# Patient Record
Sex: Male | Born: 1950 | Race: White | Hispanic: No | Marital: Married | Smoking: Never smoker
Health system: Southern US, Community
[De-identification: ages and names within clinical notes are randomized; demographics above are authoritative.]

## PROBLEM LIST (undated history)

## (undated) ENCOUNTER — Encounter

## (undated) ENCOUNTER — Telehealth

## (undated) DIAGNOSIS — R55 Syncope and collapse: Secondary | ICD-10-CM

## (undated) DIAGNOSIS — E785 Hyperlipidemia, unspecified: Secondary | ICD-10-CM

## (undated) DIAGNOSIS — Z973 Presence of spectacles and contact lenses: Secondary | ICD-10-CM

## (undated) DIAGNOSIS — E039 Hypothyroidism, unspecified: Secondary | ICD-10-CM

## (undated) DIAGNOSIS — Z87442 Personal history of urinary calculi: Secondary | ICD-10-CM

## (undated) DIAGNOSIS — R42 Dizziness and giddiness: Secondary | ICD-10-CM

## (undated) DIAGNOSIS — E079 Disorder of thyroid, unspecified: Secondary | ICD-10-CM

## (undated) DIAGNOSIS — I10 Essential (primary) hypertension: Secondary | ICD-10-CM

## (undated) HISTORY — PX: HERNIA REPAIR: SHX51

## (undated) HISTORY — DX: Disorder of thyroid, unspecified: E07.9

## (undated) HISTORY — PX: LITHOTRIPSY: SUR834

## (undated) HISTORY — PX: VASECTOMY: SHX75

## (undated) HISTORY — DX: Essential (primary) hypertension: I10

## (undated) HISTORY — DX: Hyperlipidemia, unspecified: E78.5

## (undated) HISTORY — DX: Syncope and collapse: R55

## (undated) HISTORY — PX: UPPER GI ENDOSCOPY: SHX6162

## (undated) HISTORY — PX: ROTATOR CUFF REPAIR: SHX139

## (undated) HISTORY — PX: COLONOSCOPY: SHX174

## (undated) HISTORY — PX: URETHRAL DILATION: SUR417

---

## 2011-03-04 ENCOUNTER — Other Ambulatory Visit: Payer: Self-pay | Admitting: Family Medicine

## 2011-03-04 DIAGNOSIS — N442 Benign cyst of testis: Secondary | ICD-10-CM

## 2011-03-17 ENCOUNTER — Other Ambulatory Visit: Payer: Self-pay | Admitting: Family Medicine

## 2011-03-17 ENCOUNTER — Ambulatory Visit
Admission: RE | Admit: 2011-03-17 | Discharge: 2011-03-17 | Disposition: A | Discharge status: Home / Self Care | Payer: BC Managed Care – PPO | Source: Ambulatory Visit | Attending: Family Medicine | Admitting: Family Medicine

## 2011-03-17 DIAGNOSIS — N442 Benign cyst of testis: Secondary | ICD-10-CM

## 2012-01-05 ENCOUNTER — Other Ambulatory Visit: Payer: Self-pay | Admitting: Internal Medicine

## 2012-01-05 DIAGNOSIS — N281 Cyst of kidney, acquired: Secondary | ICD-10-CM

## 2012-01-10 ENCOUNTER — Ambulatory Visit
Admission: RE | Admit: 2012-01-10 | Discharge: 2012-01-10 | Disposition: A | Discharge status: Home / Self Care | Payer: BC Managed Care – PPO | Source: Ambulatory Visit | Attending: Internal Medicine | Admitting: Internal Medicine

## 2012-01-10 DIAGNOSIS — N281 Cyst of kidney, acquired: Secondary | ICD-10-CM

## 2015-04-18 LAB — COMPREHENSIVE METABOLIC PANEL
ALT: 19
AST: 23
Alkaline Phosphatase: 95
BUN: 14 (ref 4–21)
CREATININE: 0.95
GLUCOSE: 92
Potassium: 4.5
Sodium: 140
TOTBILIFLUID: 0.5

## 2015-04-18 LAB — LIPID PANEL
Cholesterol, Total: 183
HDL: 63
LDL: 99
TRIGLYCERIDES: 107

## 2015-04-18 LAB — CBC
HEMATOCRIT: 42
Hemoglobin: 13.5
Platelet: 239
WBC: 5.73

## 2015-04-18 LAB — THYROID PANEL
FREE T4: 1.1
TSH: 2.54

## 2015-10-29 DIAGNOSIS — I10 Essential (primary) hypertension: Principal | ICD-10-CM

## 2015-10-29 DIAGNOSIS — E039 Hypothyroidism, unspecified: Secondary | ICD-10-CM

## 2015-10-29 DIAGNOSIS — E785 Hyperlipidemia, unspecified: Secondary | ICD-10-CM

## 2015-10-29 MED ORDER — LEVOTHYROXINE SODIUM 50 MCG PO TABS
3 refills | Status: CP
Start: 2015-10-29 — End: 2015-10-31

## 2015-10-29 MED ORDER — AMLODIPINE BESYLATE 10 MG PO TABS
3 refills | Status: CP
Start: 2015-10-29 — End: 2015-10-31

## 2015-10-29 MED ORDER — ATORVASTATIN CALCIUM 20 MG PO TABS
3 refills | Status: CP
Start: 2015-10-29 — End: 2015-10-31

## 2015-10-29 NOTE — Telephone Encounter
Pt pharmacy called in and stated that the pt med was lost in the mail never made to the pt and needs auth for med selected     Thank you     Pharmacy: [facility] - [place], [place] - [address]?[Patient Preferred]??[phone]

## 2015-10-31 DIAGNOSIS — I10 Essential (primary) hypertension: Principal | ICD-10-CM

## 2015-10-31 DIAGNOSIS — E039 Hypothyroidism, unspecified: Secondary | ICD-10-CM

## 2015-10-31 DIAGNOSIS — E785 Hyperlipidemia, unspecified: Secondary | ICD-10-CM

## 2015-10-31 MED ORDER — LEVOTHYROXINE SODIUM 50 MCG PO TABS
3 refills | Status: CP
Start: 2015-10-31 — End: ?

## 2015-10-31 MED ORDER — AMLODIPINE BESYLATE 10 MG PO TABS
3 refills | Status: CP
Start: 2015-10-31 — End: ?

## 2015-10-31 MED ORDER — ATORVASTATIN CALCIUM 20 MG PO TABS
3 refills | Status: CP
Start: 2015-10-31 — End: ?

## 2016-01-14 ENCOUNTER — Inpatient Hospital Stay: Admit: 2016-01-14 | Discharge: 2016-01-15

## 2016-01-14 ENCOUNTER — Ambulatory Visit: Attending: Family Medicine

## 2016-01-14 DIAGNOSIS — E039 Hypothyroidism, unspecified: Secondary | ICD-10-CM

## 2016-01-14 DIAGNOSIS — E063 Autoimmune thyroiditis: Principal | ICD-10-CM

## 2016-01-14 DIAGNOSIS — R310 Gross hematuria: Secondary | ICD-10-CM

## 2016-01-14 DIAGNOSIS — I10 Essential (primary) hypertension: Secondary | ICD-10-CM

## 2016-01-14 DIAGNOSIS — E612 Magnesium deficiency: Secondary | ICD-10-CM

## 2016-01-14 DIAGNOSIS — E785 Hyperlipidemia, unspecified: Secondary | ICD-10-CM

## 2016-01-14 LAB — T4, FREE: FREE T4: 1.1

## 2016-01-14 LAB — CBC AND DIFFERENTIAL
HCT: 42 (ref 41–53)
HCT: 42 (ref 41–53)
HEMOGLOBIN: 13.5 (ref 13.5–17.5)
Hemoglobin: 13.5 (ref 13.5–17.5)
NEUTROS ABS: 4
Platelets: 239 (ref 150–399)
Platelets: 239 (ref 150–399)
WBC: 5.7
WBC: 5.7

## 2016-01-14 LAB — BASIC METABOLIC PANEL
BUN: 14 (ref 4–21)
BUN: 14 (ref 4–21)
Creatinine: 1 (ref <=1.3)
Creatinine: 1 (ref <=1.3)
GLUCOSE: 92
Glucose: 92
POTASSIUM: 4.5 (ref 3.4–5.3)
POTASSIUM: 4.5 (ref 3.4–5.3)
SODIUM: 140 (ref 137–147)
Sodium: 140 (ref 137–147)

## 2016-01-14 LAB — LIPID PANEL
Cholesterol: 183 (ref 0–200)
Cholesterol: 183 (ref 0–200)
HDL: 63 (ref 35–70)
HDL: 63 (ref 35–70)
LDL CALC: 99
LDL CALC: 99
TRIGLYCERIDES: 107 (ref 40–160)
Triglycerides: 107 (ref 40–160)

## 2016-01-14 LAB — TSH: TSH: 2.54 (ref <=5.90)

## 2016-01-14 LAB — HEPATIC FUNCTION PANEL
ALT: 19 (ref 10–40)
ALT: 19 (ref 10–40)
AST: 23 (ref 14–40)
AST: 23 (ref 14–40)
Alkaline Phosphatase: 95 (ref 25–125)
Alkaline Phosphatase: 95 (ref 25–125)
BILIRUBIN, TOTAL: 0.5
Bilirubin, Total: 0.5

## 2016-01-15 NOTE — Progress Notes
Results reviewed and will be discussed in detail at patients upcoming appointment.

## 2016-02-05 ENCOUNTER — Ambulatory Visit: Attending: Family Medicine

## 2016-02-05 DIAGNOSIS — N189 Chronic kidney disease, unspecified: Secondary | ICD-10-CM

## 2016-02-05 DIAGNOSIS — E063 Autoimmune thyroiditis: Secondary | ICD-10-CM

## 2016-02-05 DIAGNOSIS — Z1211 Encounter for screening for malignant neoplasm of colon: Secondary | ICD-10-CM

## 2016-02-05 DIAGNOSIS — E039 Hypothyroidism, unspecified: Secondary | ICD-10-CM

## 2016-02-05 DIAGNOSIS — Z23 Encounter for immunization: Secondary | ICD-10-CM

## 2016-02-05 DIAGNOSIS — E785 Hyperlipidemia, unspecified: Secondary | ICD-10-CM

## 2016-02-05 DIAGNOSIS — D749 Methemoglobinemia, unspecified: Secondary | ICD-10-CM

## 2016-02-05 DIAGNOSIS — M7712 Lateral epicondylitis, left elbow: Secondary | ICD-10-CM

## 2016-02-05 DIAGNOSIS — E079 Disorder of thyroid, unspecified: Secondary | ICD-10-CM

## 2016-02-05 DIAGNOSIS — N281 Cyst of kidney, acquired: Secondary | ICD-10-CM

## 2016-02-05 DIAGNOSIS — Z125 Encounter for screening for malignant neoplasm of prostate: Secondary | ICD-10-CM

## 2016-02-05 DIAGNOSIS — R918 Other nonspecific abnormal finding of lung field: Secondary | ICD-10-CM

## 2016-02-05 DIAGNOSIS — Z0000 Encounter for general adult medical examination without abnormal findings: Principal | ICD-10-CM

## 2016-02-05 DIAGNOSIS — I10 Essential (primary) hypertension: Principal | ICD-10-CM

## 2016-02-06 NOTE — Progress Notes
Heart: The heart is normal is size.  There is no pericardial effusion seen.  The cardiac   vessels demonstrate minimal or no calcification.  Aorta and Great Vessels:No aneurysm of the aortic arch or thoracic aorta is seen.  The proximal   great vessels demonstrate no significant stenoses.  Lungs: The lung fields are clear bilaterally without infiltrates, volume loss, or mass lesions.     There are no pleural effusions, pneumothorax, or hemothorax seen. Stable right upper lobe   subpleural nodular density, series 4 image 202. Stable right lower lobe subpleural nodular   density, series 4 image 432. Additional stable 3 mm right lower lobe nodular density, series 4   image 357. No new or increasing pulmonary nodules.  ?  Upper Abdomen: Limited images of the visualized upper abdomen demonstrate no acute abnormality.   Again visualized is right renal cystic finding within the superior pole of the right kidney   which is incompletely visualized and demonstrates internal calcified septation.  ?  IMPRESSION:  ?  Stable nonspecific subcentimeter pulmonary nodules. No new or increasing nodules. No further   specific radiologic follow-up recommended.  ?  Right renal superior pole cystic hypodensity. Please see prior renal ultrasounds for further   details.  ?  Read By Beverly Milch- Christopher Klassen M.D.    Electronically Verified By Beverly Milch- Christopher Klassen M.D.    Released Date Time - 09/26/2015 8:39 AM    Resident -   ?  Past Medical History:   Diagnosis Date   ? Chronic kidney disease     kidney stones and cysts   ? Chronic lymphocytic thyroiditis 05/30/2012    Allscripts Description: Hashimoto's Thyroiditis  (Created by Conversion)    ? Hyperlipidemia    ? Hyperlipidemia, unspecified hyperlipidemia type 04/21/2012    Allscripts Description: Hyperlipidemia  (Created by Conversion)    ? Hypertension    ? Hypothyroidism 04/21/2012    Allscripts Description: Hypothyroidism  (Created by Conversion)    ? Lung nodules

## 2016-02-06 NOTE — Progress Notes
?   Methemoglobinemia 2006    per pt, given too much novicaine during dental procedure, was in hosptial 4 days   ? Thyroid disease      Past Surgical History:   Procedure Laterality Date   ? ARTHROSCOPY SHOULDER W/ ROTATOR CUFF REPAIR Right 08/15/2014    ARTHROSCOPY SHOULDER W/ ROTATOR CUFF REPAIR SUBACROMIAL DECOMPRESSION performed by Venancio PoissonBrunelli, Jeffrey Allen[facility]   ? COLONOSCOPY  2009   ? HERNIA REPAIR     ? LITHOTRIPSY      Surgery Description: Lithotripsy;  Problem Comments: twice (Created by Conversion)   ? OTHER SURGICAL HISTORY      Surgery Description: Surgery Of Male Genitalia Vasectomy;   (Created by Conversion)   ? UMBILICAL HERNIA REPAIR      Surgery Description: Umbilical Hernia Repair - Over Age 50;   (Created by Conversion)   ? UPPER GASTROINTESTINAL ENDOSCOPY  2009   ? URETHRAL STRICTURE DILATATION      Surgery Description: Dilation Of Male Urethral Stricture;  Problem Comments: five times (Created by Conversion)     Family History   Problem Relation Age of Onset   ? Cancer Mother      ovarian   ? Heart Disease Father    ? Hypertension Father    ? Cancer Brother      lymph   ? Eczema Sister      Social History     Social History   ? Marital status: Married     Spouse name: N/A   ? Number of children: N/A   ? Years of education: N/A     Occupational History   ? Not on file.     Social History Main Topics   ? Smoking status: Never Smoker   ? Smokeless tobacco: Never Used   ? Alcohol use 12.0 oz/week     1 Cans of beer per week   ? Drug use: No   ? Sexual activity: Not on file     Other Topics Concern   ? Not on file     Social History Narrative     Current Outpatient Prescriptions on File Prior to Visit   Medication Sig   ? amLODIPine (NORVASC) 10 MG Tablet TAKE 1 TABLET DAILY AS DIRECTED   ? atorvastatin (LIPITOR) 20 MG Tablet TAKE 1 TABLET DAILY AT BEDTIME   ? levothyroxine (SYNTHROID, LEVOTHROID) 50 MCG Tablet TAKE 1 TABLET DAILY

## 2016-02-06 NOTE — Progress Notes
education was provided in the AVS.  See orders for any further follow up plans.  Colonoscopy ordered stressed importance  Check PSA in Feb           2. Abnormal ct chest  Stable   No further workup needed  Call if any issues    2A.  Hypothyroidism  Euthyroid on current dose  Recheck in Spring 2018     2B  Golfers elbow  Recommend strap  Call if not improving  Consider imaging and ortho consult  He asks to wait     2C. Suture granuloma   stable  Consider surg consult if worsening  Precautions given       3.  Right renal cyst with nephrolitihiasis  Recheck us in Dec  Hydrate well   Aim for 2l of urine daily  Call if any changes     4. Hyperlipidemia  Doing well   Continue lipitor  Check labs in 4 months     No orders of the following type(s) were placed in this encounter: Medications.     Orders Placed This Encounter   Procedures   ? COLONOSCOPY W/ OR W/O BIOPSY   ? US Renal Bladder Complete   ? Comprehensive Metabolic Panel - Therapy Plan   ? TSH   ? PSA (Screening)   ? Hemoglobin A1c w/EAG   ? Urinalysis W/Microscopy   ? Lipid Panel       Health Maintenance was reviewed. The patient's HM Topic list was:                                            Health Maintenance   Topic Date Due   ? Preventive Wellness Visit  01/13/1970   ? DTaP,Tdap,and Td Vaccines (1 - Tdap) 01/14/1971   ? Pneumovax / Prevnar (1 of 1 - PPSV23) 01/14/1971   ? Colon Cancer Screening  01/13/2002   ? Zoster Vaccine (1) 01/14/2012   ? Influenza Vaccine (1) 12/05/2015   ? Lipid Profile  01/13/2017   ? Basic Metabolic Panel  01/13/2017   ? USPSTF HIV Risk Assessment  Addressed   ? USPSTF Hepatitis C Screening  Completed     No changes made.

## 2016-02-06 NOTE — Progress Notes
Initials Name Effective Dates    MD Kennis CarinaDonovan, Mary A, [place] 07/08/15 -         Physical Exam    GENERAL: Vitals reviewed and patient is in NAD  Pulse 99% on RA BMI 29  HEENT: Neck supple.     LUNGS: Clear to auscultation bilaterally, no wheezes with forced expiration, respirations unlabored.  HEART: Regular rate and rhythm,  no ectopic beats or murmur  ABDOMEN: Non-tender, no organomegaly, no palpable masses or lesions.   No cva ttp b/l   MUSC  Left elbow  Full rom  Mild ttp over medial epicondyle  Light touch intact distally   EXTREMITIES: No edema.    SKIN: No rashes, no evidence of skin breakdown,    NEURO: Gait normal.   MENTAL STATUS: Alert, normal MS. Answers all questions appropriately.      Assessment:       ICD-10-CM ICD-9-CM    1. Physical exam Z00.00 V70.9    2. Need for prophylactic vaccination and inoculation against influenza Z23 V04.81    3. Screen for colon cancer Z12.11 V76.51 COLONOSCOPY W/ OR W/O BIOPSY   4. Renal cyst, right N28.1 753.10 US Renal Bladder Complete   5. Lateral epicondylitis of left elbow M77.12 726.32    6. Screening for prostate cancer Z12.5 V76.44 PSA (Screening)   7. Hyperlipidemia, unspecified hyperlipidemia type E78.5 272.4 CBC and Differential - Therapy Plan      Comprehensive Metabolic Panel - Therapy Plan      TSH      PSA (Screening)      Hemoglobin A1c w/EAG      Urinalysis W/Microscopy      Lipid Panel   8. Hypothyroidism, unspecified type E03.9 244.9 CBC and Differential - Therapy Plan      Comprehensive Metabolic Panel - Therapy Plan      TSH      PSA (Screening)      Hemoglobin A1c w/EAG      Urinalysis W/Microscopy      Lipid Panel          Plan:   1. Physical exam  Up 2lbs  BMI 29   Mabel's Estimated body mass index is 29.7 kg/(m^2) as calculated from the following:    Height as of this encounter: 1.778 m (5\' 10" ).    Weight as of this encounter: 93.9 kg (207 lb).      The health risks associated with an elevated BMI were discussed and

## 2016-02-06 NOTE — Progress Notes
ALBUMIN/GLOBULIN RATIO Latest Units: (calc) 1.5  1.5   AST Latest Ref Range: 14 - 33 IU/L 19  23   ALT Latest Ref Range: 10 - 42 IU/L 19  19   EGFR Latest Units: mL/min/1.73M2 >59  >59   Total Bilirubin Latest Ref Range: 0.2 - 1.0 mg/dL 0.5  0.5   Alkaline Phosphatase Latest Ref Range: 40 - 129 IU/L 95  95   Osmolality Calc Unknown 281.9  279.5   MAGNESIUM Latest Ref Range: 1.8 - 2.6 mg/dL   2.3   Cholesterol, Total Latest Ref Range: 100 - 199 mg/dL 171  183   HDL Latest Ref Range: >=60 mg/dL 60  63   LDL Calculated Latest Units: mg/dL 91  99   NON-HDL CHOLESTEROL Latest Units: mg/dL (calc) 111  120   Triglycerides Latest Ref Range: <=150 mg/dL 102  107   est AVG Glucose Latest Units: mg/dL 102.54     Free T4 Latest Ref Range: 0.80 - 1.70 ng/dL   1.10   HGB A1C % Latest Ref Range: 4.8 - 5.9 % 5.2     TSH, 3RD GENERATION Latest Ref Range: 0.270 - 4.200 mIU/L 3.430  2.540   WBC Latest Ref Range: 4.5 - 11 x10E3/uL 5.68  5.73   RBC Latest Ref Range: 4.50 - 6.30 x10E6/uL 4.92  4.82   HEMOGLOBIN Latest Ref Range: 14.0 - 18.0 g/dL 14.0  13.5 (L)   HEMATOCRIT Latest Ref Range: 40.0 - 54.0 % 44.5  42.1   MCV Latest Ref Range: 82.0 - 101.0 fl 90.4  87.3   MCH Latest Ref Range: 27.0 - 34.0 pg 28.5  28.0   MCHC Latest Ref Range: 31.0 - 36.0 g/dL 31.5  32.1   RDW Latest Ref Range: 12.0 - 16.1 % 13.2  13.0   PLATELET COUNT Latest Ref Range: 140 - 440 thou/cu mm 221  239   MPV Latest Ref Range: 9.5 - 11.5 fl 11.0  10.8   Neutrophils Latest Ref Range: 34.0 - 73.0 % 61.4  62.2   IMMATURE GRANULOCYTES Latest Ref Range: 0.0 - 2.0 % 0.4  0.2   Lymphocytes % Latest Ref Range: 25.0 - 45.0 % 24.8 (L)  26.2   MONOCYTES Latest Ref Range: 2.0 - 6.0 % 8.3 (H)  7.2 (H)   EOSINOPHILS Latest Ref Range: 1.0 - 4.0 % 4.2 (H)  3.5   nRBC % Latest Ref Range: 0.0 - 1.0 % 0.0  0.0   Basophils % Latest Ref Range: 0 - 1 % 0.9  0.7   Neutrophils Absolute Latest Ref Range: 1.80 - 8.70 x10E3/uL 3.49  3.57

## 2016-02-06 NOTE — Progress Notes
?   magnesium oxide (MAG-OX) 400 (241.3 MG) MG tablet Take 400 mg by mouth daily.     No current facility-administered medications on file prior to visit.      Allergies   Allergen Reactions   ? Oxycodone-Acetaminophen      Extreme leg pain         Review of Systems  Review of Systems  CONSTITUTIONAL: Appetite good, no fevers, mild fatigue no  weight loss, no headache  CV: No chest pain, palpitations, PND or peripheral edema  RESPIRATORY: No cough, shortness of breath, wheezing or dyspnea  GI: No nausea/vomiting, change in bowel habits, blood in stool, dysphagia, heartburn or abdominal pain  GU: No dysuria, urgency or incontinence   EXT: Pain in left elbow  NEURO: No dizziness, numbness, MS changes, motor weakness, or syncope      Objective:        VITAL SIGNS (all recorded)      Clinic Vitals       02/05/16 0905             Amb Encounter Vitals    Weight 93.9 kg (207 lb)    -MD at 02/05/16 0907       Height 1.778 m (5\' 10" )    -MD at 02/05/16 0907       BMI (Calculated) 29.76    -MD at 02/05/16 0907       BSA (Calculated - sq m) 2.15    -MD at 02/05/16 0907       BP 117/74    -MD at 02/05/16 0907       BP Location Left upper arm    -MD at 02/05/16 0907       Position Sitting    -MD at 02/05/16 0907       Pulse 61    -MD at 02/05/16 0907       Resp 16    -MD at 02/05/16 0907       Temp 36.5 ?C (97.7 ?F)    -MD at 02/05/16 [id number]       Temperature Source Oral    -MD at 02/05/16 0907       O2 Saturation 99 %    -MD at 02/05/16 0907       FiO2 Source RA    -MD at 02/05/16 0907       Pain Score 1    -MD at 02/05/16 [id number]       Location kidney    -MD at 02/05/16 0907       Education/Communication Barriers?    Learning/Communication Barriers? No    -MD at 02/05/16 0907       Fall Risk Assessment    Had recent fall / Last 6 months? No recent fall    -MD at 02/05/16 0907       Does patient have a fear of falling? No    -MD at 02/05/16 0907         User Key  (r) = Recorded By, (t) = Taken By, (c) = Cosigned By

## 2016-02-06 NOTE — Progress Notes
Subjective:   Robert BodoDavid Schurman is a 65 y.o. male being seen today for PHYSICAL EXAM       HPI  Pnt is a 65 year old male here for a f/u a Physical    He is doing well overall    Has occasional left elbow pain.  It comes with lifting things. He denies any truama. Better with rest.  Is worse with motion.  Pain is sharp    He is here for a lab review.  He has very  Borderline anemia. He denies blood or black in his stools.     He would like a flu shot    He has a large right renal cyst.  He has seen Dr Mayford KnifeWilliams and has been recommended to follow it serially.   He is due for repeat us in January. He has no current complaints has occasionally pain but overall is doing good.     [place] and he does not have the report. He is open to a new one.          Results for Robert BodoBAKER, Ryosuke (MRN [id number]) as of 02/05/2016 09:16   Ref. Range 12/28/2013 08:15 06/10/2014 08:00 04/18/2015 07:50   PSA Screening Latest Ref Range: 0.0 - 4.0 ng/mL  0.91    PSA, TOTAL Latest Ref Range: 0.00 - 4.00 ng/ml 0.7  0.78       Results for Robert BodoBAKER, Rishon (MRN [id number]) as of 02/05/2016 09:16   Ref. Range 08/12/2015 07:50 09/25/2015 15:53 01/14/2016 09:10   SODIUM Latest Ref Range: 135 - 145 mmol/L 142  140   POTASSIUM, SERUM Latest Ref Range: 3.3 - 4.6 mmol/L 4.1  4.5   CHLORIDE, SERUM Latest Ref Range: 101 - 110 mmol/L 101  102   CARBON DIOXIDE TOTAL Latest Ref Range: 21 - 29 mmol/L 28  27   Urea Nitrogen Latest Ref Range: 6 - 22 mg/dL 11  14   CREATININE Latest Ref Range: 0.67 - 1.17 mg/dL 4.130.95  2.440.95   GLUCOSE, SERUM Latest Ref Range: 71 - 99 mg/dL 87  92   CALCIUM, SERUM Latest Ref Range: 8.6 - 10.0 mg/dL 8.9  9.1   Calc Total Globuin Latest Units: gm/dL 2.8  3.1   ANION GAP Latest Ref Range: 4 - 16 mmol/L 13  11   BUN/Creatinine Ratio Latest Ref Range: 6.0 - 22.0 (calc) 11.6  14.7   TOTAL PROTEIN Latest Ref Range: 6.5 - 8.3 g/dL 7.1  7.6   ALBUMIN Latest Ref Range: 3.8 - 4.9 g/dL 4.3  4.5

## 2016-02-06 NOTE — Progress Notes
Lymphocytes Absolute Latest Units: x10E3/uL 1.41  1.50   Monocytes Absolute Latest Units: x10E3/uL 0.47  0.41   Eosinophils Absolute Latest Units: x10E3/uL 0.24  0.20   Basophils Absolute Latest Units: x10E3/uL 0.05  0.04   Absolute NRBC Count Unknown 0.00  0.00   CBC AND DIFFERENTIAL Unknown Rpt (A)  Rpt (A)   Color -Ur Latest Ref Range: Amber  Yellow  Straw   Clarity, UA Latest Ref Range: Hazy  Cloudy  Clear   PH, URINE Latest Ref Range: 4.5 - 8.0  7.0  7.0   Specific Gravity, Urine Latest Ref Range: 1.003 - 1.030  1.014  1.006   Bilirubin -Ur Latest Ref Range: Negative  Negative  Negative   Blood -Ur Latest Ref Range: Negative  Negative  Negative   Glucose -Ur Latest Ref Range: Negative mg/dL Negative  Negative   Nitrite -Ur Latest Ref Range: Negative  Negative  Negative   Protein-UA Latest Ref Range: Negative mg/dL Negative  Negative   WBC -Ur Latest Ref Range: 0 - 5 HPF 1  0   Leukocytes -Ur Latest Ref Range: Negative  Negative  Negative   Ketones UA Latest Ref Range: Negative mg/dL Negative  Negative   RBC -Ur Latest Ref Range: 0 - 5 HPF <1  0   Squam Epithel, UA Latest Ref Range: Not established /HPF   <1   Bacteria -Ur Latest Ref Range: None seen /HPF None seen  None seen   Urobilinogen -Ur Latest Ref Range: Normal  Normal  Normal   ASCORBIC ACID Latest Ref Range: 20 mg/dL Negative  Negative           Procedure:  CT CHEST W/O CON  Ordering History:   Pulmonary nodules  Additional History: None  ?  Comparison: CT chest from 06/09/2014  ?  Technique: Axial images of the chest from the lung apices through the superior abdomen without   the administration of intravenous contrast are reviewed. High resolution multiplanar   reconstructions are also reviewed.  ?  FINDINGS:  ?  Thyroid: No significant abnormalities.  Mediastinum/hilum: The hila are normal in appearance bilaterally.  No mediastinal masses or   adenopathy is seen.  The visualized tracheobronchial tree is normal

## 2016-03-22 ENCOUNTER — Inpatient Hospital Stay: Admit: 2016-03-22 | Discharge: 2016-03-23

## 2016-03-22 DIAGNOSIS — N200 Calculus of kidney: Secondary | ICD-10-CM

## 2016-03-22 DIAGNOSIS — N281 Cyst of kidney, acquired: Principal | ICD-10-CM

## 2016-06-01 DIAGNOSIS — Z1211 Encounter for screening for malignant neoplasm of colon: Principal | ICD-10-CM

## 2016-06-01 MED ORDER — PEG 3350-KCL-NABCB-NACL-NASULF 236 G PO SOLR
4000 mL | Freq: Once | ORAL | 0 refills | Status: CP
Start: 2016-06-01 — End: ?

## 2016-06-03 DIAGNOSIS — E785 Hyperlipidemia, unspecified: Secondary | ICD-10-CM

## 2016-06-03 DIAGNOSIS — Z1211 Encounter for screening for malignant neoplasm of colon: Principal | ICD-10-CM

## 2016-06-03 DIAGNOSIS — I10 Essential (primary) hypertension: Secondary | ICD-10-CM

## 2016-06-03 DIAGNOSIS — Z79899 Other long term (current) drug therapy: Secondary | ICD-10-CM

## 2016-06-03 DIAGNOSIS — K621 Rectal polyp: Secondary | ICD-10-CM

## 2016-06-03 DIAGNOSIS — K648 Other hemorrhoids: Secondary | ICD-10-CM

## 2016-06-03 DIAGNOSIS — K219 Gastro-esophageal reflux disease without esophagitis: Secondary | ICD-10-CM

## 2016-06-03 DIAGNOSIS — D122 Benign neoplasm of ascending colon: Secondary | ICD-10-CM

## 2016-06-04 ENCOUNTER — Encounter: Attending: Family Medicine

## 2016-06-08 ENCOUNTER — Ambulatory Visit: Attending: Family Medicine

## 2016-06-08 DIAGNOSIS — E063 Autoimmune thyroiditis: Secondary | ICD-10-CM

## 2016-06-08 DIAGNOSIS — E785 Hyperlipidemia, unspecified: Secondary | ICD-10-CM

## 2016-06-08 DIAGNOSIS — D749 Methemoglobinemia, unspecified: Secondary | ICD-10-CM

## 2016-06-08 DIAGNOSIS — Z6829 Body mass index (BMI) 29.0-29.9, adult: Secondary | ICD-10-CM

## 2016-06-08 DIAGNOSIS — E079 Disorder of thyroid, unspecified: Secondary | ICD-10-CM

## 2016-06-08 DIAGNOSIS — N281 Cyst of kidney, acquired: Secondary | ICD-10-CM

## 2016-06-08 DIAGNOSIS — R918 Other nonspecific abnormal finding of lung field: Secondary | ICD-10-CM

## 2016-06-08 DIAGNOSIS — E039 Hypothyroidism, unspecified: Secondary | ICD-10-CM

## 2016-06-08 DIAGNOSIS — R0989 Other specified symptoms and signs involving the circulatory and respiratory systems: Secondary | ICD-10-CM

## 2016-06-08 DIAGNOSIS — R1310 Dysphagia, unspecified: Principal | ICD-10-CM

## 2016-06-08 DIAGNOSIS — H8111 Benign paroxysmal vertigo, right ear: Principal | ICD-10-CM

## 2016-06-08 DIAGNOSIS — N189 Chronic kidney disease, unspecified: Secondary | ICD-10-CM

## 2016-06-08 DIAGNOSIS — I10 Essential (primary) hypertension: Principal | ICD-10-CM

## 2016-06-08 DIAGNOSIS — N442 Benign cyst of testis: Secondary | ICD-10-CM

## 2016-06-08 MED ORDER — MECLIZINE HCL 12.5 MG PO TABS
12.5 mg | Freq: Three times a day (TID) | ORAL | 1 refills | Status: CP | PRN
Start: 2016-06-08 — End: ?

## 2016-06-08 NOTE — Patient Instructions
?   After doing the Epley maneuver, you can return to your normal activities.  ? Ask your health care provider if there is anything you should do at home to prevent vertigo. He or she may recommend that you:  ? Keep your head raised (elevated) with two or more pillows while you sleep.  ? Do not sleep on the side of your affected ear.  ? Get up slowly from bed.  ? Avoid sudden movements during the day.  ? Avoid extreme head movement, like looking up or bending over.  Contact a health care provider if:  ? Your vertigo gets worse.  ? You have other symptoms, including:  ? Nausea.  ? Vomiting.  ? Headache.  Get help right away if:  ? You have vision changes.  ? You have a severe or worsening headache or neck pain.  ? You cannot stop vomiting.  ? You have new numbness or weakness in any part of your body.  Summary  ? Vertigo is the feeling that you or your surroundings are moving when they are not.  ? The Epley maneuver is an exercise that relieves symptoms of vertigo.  ? If the Epley maneuver is done correctly, it is considered safe. You can do it up to 3 times a day.  This information is not intended to replace advice given to you by your health care provider. Make sure you discuss any questions you have with your health care provider.  Document Released: 03/27/2013 Document Revised: 02/10/2016 Document Reviewed: 02/10/2016  Elsevier Interactive Patient Education ? 2017 [employer]

## 2016-06-08 NOTE — Patient Instructions
How to Perform the Epley Maneuver  The Epley maneuver is an exercise that relieves symptoms of vertigo. Vertigo is the feeling that you or your surroundings are moving when they are not. When you feel vertigo, you may feel like the room is spinning and have trouble walking. Dizziness is a little different than vertigo. When you are dizzy, you may feel unsteady or light-headed.  You can do this maneuver at home whenever you have symptoms of vertigo. You can do it up to 3 times a day until your symptoms go away.  Even though the Epley maneuver may relieve your vertigo for a few weeks, it is possible that your symptoms will return. This maneuver relieves vertigo, but it does not relieve dizziness.  What are the risks?  If it is done correctly, the Epley maneuver is considered safe. Sometimes it can lead to dizziness or nausea that goes away after a short time. If you develop other symptoms, such as changes in vision, weakness, or numbness, stop doing the maneuver and call your health care provider.  How to perform the Epley maneuver  1. Sit on the edge of a bed or table with your back straight and your legs extended or hanging over the edge of the bed or table.  2. Turn your head halfway toward the affected ear or side.  3. Lie backward quickly with your head turned until you are lying flat on your back. You may want to position a pillow under your shoulders.  4. Hold this position for 30 seconds. You may experience an attack of vertigo. This is normal.  5. Turn your head to the opposite direction until your unaffected ear is facing the floor.  6. Hold this position for 30 seconds. You may experience an attack of vertigo. This is normal. Hold this position until the vertigo stops.  7. Turn your whole body to the same side as your head. Hold for another 30 seconds.  8. Sit back up.  You can repeat this exercise up to 3 times a day.  Follow these instructions at home:

## 2016-06-09 NOTE — Progress Notes
Subjective:   Robert Fischer is a 66 y.o. male being seen today for Follow up: Radiology/Imaging Result (f/u U/S) and Dizziness (vertigo to nausea)       HPI  Pnt is a 66 year old male here for a f/u      He is here for a f/u for an us.  He has a stable renal cysts and and kidney stone on his right side. He has seen Dr Mayford KnifeWilliams and has been recommended to follow it serially    He has new vertigo.  He states if he turns his head quickly in bed this happens. No dizziness just sitting here.  He states it comes with rolling over or turning his head quickly. No dizziness with standing up quickly. Seems to be when he lies down quickly. No headache.  No falls.      He has a colonoscopy pending next week           Study Result    ? Procedure:  US RENAL BLADDER COMPLETE  Ordering History:  Renal cyst, right  Additional History: None  ?  Technique: Real-time grayscale and color Doppler imaging of the retroperitoneum was performed.  ?  Comparison: Ultrasound of the kidneys from 04/28/2015, CT of the abdomen and pelvis without and   with contrast from 03/15/2013.  ?  Findings:   Right kidney: 11.7 cm in length, normal in size.  Lobulated cystic lesion with thick internal   septation at the superior pole of the right kidney has not significantly changed given   differences in technique and measures 4.5 x 4.6 x 6.4 cm.  There is nonobstructing   nephrolithiasis of the midpole measuring up to 0.5 cm  There is no hydronephrosis.   Left kidney: 12 cm in length, normal in size.  There is normal parenchymal echogenicity.  There   is a stable cyst at the inferior pole of the left kidney measuring 2 x 1.7 x 2.2 cm.  There is   no hydronephrosis.   Urinary Bladder:  Only partially distended precluding evaluation.  ?  Proximal aorta: 2.3 cm   Mid aorta: 1.4 cm   Distal aorta: 0.4 cm   Right common iliac artery: Not seen due to bowel gas.  Left common iliac artery: Not seen due to bowel gas.  Proximal IVC: Negative.  ?  IMPRESSION:

## 2016-06-09 NOTE — Progress Notes
Impression:  Stable renal cysts and no hydronephrosis.  ?  Nonobstructing right nephrolithiasis.  ?  Read By - Elby Showers    Electronically Verified By - Elby Showers    Released Date Time - 03/22/2016 4:08 PM    Resident -   ?     ?  Study Result       Results for Robert Fischer, Robert Fischer (MRN [id number]) as of 06/08/2016 14:48   Ref. Range 01/14/2016 09:10   SODIUM Latest Ref Range: 135 - 145 mmol/L 140   POTASSIUM, SERUM Latest Ref Range: 3.3 - 4.6 mmol/L 4.5   CHLORIDE, SERUM Latest Ref Range: 101 - 110 mmol/L 102   CARBON DIOXIDE TOTAL Latest Ref Range: 21 - 29 mmol/L 27   Urea Nitrogen Latest Ref Range: 6 - 22 mg/dL 14   CREATININE Latest Ref Range: 0.67 - 1.17 mg/dL 0.95   GLUCOSE, SERUM Latest Ref Range: 71 - 99 mg/dL 92   CALCIUM, SERUM Latest Ref Range: 8.6 - 10.0 mg/dL 9.1   Calc Total Globuin Latest Units: gm/dL 3.1   ANION GAP Latest Ref Range: 4 - 16 mmol/L 11   BUN/Creatinine Ratio Latest Ref Range: 6.0 - 22.0 (calc) 14.7   TOTAL PROTEIN Latest Ref Range: 6.5 - 8.3 g/dL 7.6   ALBUMIN Latest Ref Range: 3.8 - 4.9 g/dL 4.5   ALBUMIN/GLOBULIN RATIO Latest Units: (calc) 1.5   AST Latest Ref Range: 14 - 33 IU/L 23   ALT Latest Ref Range: 10 - 42 IU/L 19   EGFR Latest Units: mL/min/1.73M2 >59   Total Bilirubin Latest Ref Range: 0.2 - 1.0 mg/dL 0.5   Alkaline Phosphatase Latest Ref Range: 40 - 129 IU/L 95   Osmolality Calc Unknown 279.5   MAGNESIUM Latest Ref Range: 1.8 - 2.6 mg/dL 2.3   Cholesterol, Total Latest Ref Range: 100 - 199 mg/dL 183   HDL Latest Ref Range: >=60 mg/dL 63   LDL Calculated Latest Units: mg/dL 99   NON-HDL CHOLESTEROL Latest Units: mg/dL (calc) 120   Triglycerides Latest Ref Range: <=150 mg/dL 107   Free T4 Latest Ref Range: 0.80 - 1.70 ng/dL 1.10   TSH, 3RD GENERATION Latest Ref Range: 0.270 - 4.200 mIU/L 2.540   WBC Latest Ref Range: 4.5 - 11 x10E3/uL 5.73   RBC Latest Ref Range: 4.50 - 6.30 x10E6/uL 4.82   HEMOGLOBIN Latest Ref Range: 14.0 - 18.0 g/dL 13.5 (L)

## 2016-06-09 NOTE — Progress Notes
HEMATOCRIT Latest Ref Range: 40.0 - 54.0 % 42.1   MCV Latest Ref Range: 82.0 - 101.0 fl 87.3   MCH Latest Ref Range: 27.0 - 34.0 pg 28.0   MCHC Latest Ref Range: 31.0 - 36.0 g/dL 91.432.1   RDW Latest Ref Range: 12.0 - 16.1 % 13.0   PLATELET COUNT Latest Ref Range: 140 - 440 thou/cu mm 239   MPV Latest Ref Range: 9.5 - 11.5 fl 10.8   Neutrophils Latest Ref Range: 34.0 - 73.0 % 62.2   IMMATURE GRANULOCYTES Latest Ref Range: 0.0 - 2.0 % 0.2   Lymphocytes % Latest Ref Range: 25.0 - 45.0 % 26.2   MONOCYTES Latest Ref Range: 2.0 - 6.0 % 7.2 (H)   EOSINOPHILS Latest Ref Range: 1.0 - 4.0 % 3.5   nRBC % Latest Ref Range: 0.0 - 1.0 % 0.0   Basophils % Latest Ref Range: 0 - 1 % 0.7   Neutrophils Absolute Latest Ref Range: 1.80 - 8.70 x10E3/uL 3.57   Lymphocytes Absolute Latest Units: x10E3/uL 1.50   Monocytes Absolute Latest Units: x10E3/uL 0.41   Eosinophils Absolute Latest Units: x10E3/uL 0.20   Basophils Absolute Latest Units: x10E3/uL 0.04   Absolute NRBC Count Unknown 0.00   CBC AND DIFFERENTIAL Unknown Rpt (A)   Color -Ur Latest Ref Range: Dispensing opticianAmber  Straw   Clarity, UA Latest Ref Range: Hazy  Clear   PH, URINE Latest Ref Range: 4.5 - 8.0  7.0   Specific Gravity, Urine Latest Ref Range: 1.003 - 1.030  1.006   Bilirubin -Ur Latest Ref Range: Negative  Negative   Blood -Ur Latest Ref Range: Negative  Negative   Glucose -Ur Latest Ref Range: Negative mg/dL Negative   Nitrite -Ur Latest Ref Range: Negative  Negative   Protein-UA Latest Ref Range: Negative mg/dL Negative   WBC -Ur Latest Ref Range: 0 - 5 HPF 0   Leukocytes -Ur Latest Ref Range: Negative  Negative   Ketones UA Latest Ref Range: Negative mg/dL Negative   RBC -Ur Latest Ref Range: 0 - 5 HPF 0   Squam Epithel, UA Latest Ref Range: Not established /HPF <1   Bacteria -Ur Latest Ref Range: None seen /HPF None seen   Urobilinogen -Ur Latest Ref Range: Normal  Normal   ASCORBIC ACID Latest Ref Range: 20 mg/dL Negative     Past Medical History:   Diagnosis Date

## 2016-06-09 NOTE — Progress Notes
Other Topics Concern   ? Not on file     Social History Narrative     Current Outpatient Prescriptions on File Prior to Visit   Medication Sig   ? amLODIPine (NORVASC) 10 MG Tablet TAKE 1 TABLET DAILY AS DIRECTED   ? atorvastatin (LIPITOR) 20 MG Tablet TAKE 1 TABLET DAILY AT BEDTIME   ? levothyroxine (SYNTHROID, LEVOTHROID) 50 MCG Tablet TAKE 1 TABLET DAILY   ? magnesium oxide (MAG-OX) 400 (241.3 MG) MG tablet Take 400 mg by mouth daily.   ? PEG-3350 and electrolytes (GoLYTELY) 236 g PO Solution Reconstituted Take 4,000 mLs by mouth once for 1 dose.     No current facility-administered medications on file prior to visit.      Allergies   Allergen Reactions   ? Oxycodone-Acetaminophen      Extreme leg pain         Review of Systems  Review of Systems  CONSTITUTIONAL: Appetite good, no fevers, no  fatigue no  weight loss, no headache  CV: No chest pain, palpitations, PND or peripheral edema  RESPIRATORY: No cough, shortness of breath, wheezing or dyspnea  GI: No nausea/vomiting, change in bowel habits, blood in stool, dysphagia, heartburn or abdominal pain  GU: No dysuria, incontinence   NEURO: + intermittent  dizziness, numbness, MS changes, motor weakness, or syncope      Objective:        VITAL SIGNS (all recorded)      Clinic Vitals       06/08/16 1419             Amb Encounter Vitals    Weight 93.4 kg (206 lb)    -MD at 06/08/16 1422       Height 1.778 m (5\' 10" )    -MD at 06/08/16 1422       BMI (Calculated) 29.62    -MD at 06/08/16 1422       BSA (Calculated - sq m) 2.15    -MD at 06/08/16 1422       BP 113/67    -MD at 06/08/16 1422       BP Location Left upper arm    -MD at 06/08/16 1422       Position Sitting    -MD at 06/08/16 1422       Pulse 71    -MD at 06/08/16 1422       Resp 16    -MD at 06/08/16 1422       Temp 36.2 ?C (97.1 ?F)    -MD at 06/08/16 1422       Temperature Source Oral    -MD at 06/08/16 1422       O2 Saturation 97 %    -MD at 06/08/16 1422       FiO2 Source RA

## 2016-06-09 NOTE — Progress Notes
Neuro exam wnl  Patient given precautions that illnesses may evolve and that they should seek immediate medical treatment if they have:  Worsening fever   Shortness of breath   Vomiting and inability to keep down liquids   Severe pain or worsening pain   Mental status changes  No improvement in 3-5 days   Worsening of symptoms   Any adverse affects they feel is due to any of their current medications    Do not drive or work if any concerns           2. Abnormal ct chest  Stable   No further workup needed  Call if any issues    3.  Hypothyroidism  Euthyroid on current dose  Check labs and address            4.  Right renal cyst with nephrolitihiasis  Recheck us in  1 year  [place]   Hydrate well   Aim for 2l of urine daily  No current complaints   Call if any changes     5. Hyperlipidemia  Doing well   Continue lipitor  Check labs in 4 months     6. Epidydimal cysts?  Check us of testicles   Orders Placed This Encounter   Medications   ? meclizine (ANTIVERT) 12.5 MG PO Tablet     Sig: Take 1 tablet by mouth 3 times daily as needed.     Dispense:  60 tablet     Refill:  1     Orders Placed This Encounter   Procedures   ? US Carotid Complete   ? Refer to Physical Therapy       Health Maintenance was reviewed. The patient's HM Topic list was:                                            Health Maintenance   Topic Date Due   ? Preventive Wellness Visit  01/13/1970   ? DTaP,Tdap,and Td Vaccines (1 - Tdap) 01/14/1971   ? Pneumovax / Prevnar (1 of 1 - PPSV23) 01/14/1971   ? Colon Cancer Screening  01/13/2002   ? Zoster Vaccine (1) 01/14/2012   ? Lipid Profile  01/13/2017   ? Basic Metabolic Panel  01/13/2017   ? USPSTF HIV Risk Assessment  Addressed   ? USPSTF Hepatitis C Screening  Completed   ? Influenza Vaccine  Completed     No changes made.

## 2016-06-09 NOTE — Progress Notes
-  MD at 06/08/16 1422       Pain Score Zero    -MD at 06/08/16 1422       Education/Communication Barriers?    Learning/Communication Barriers? No    -MD at 06/08/16 1422       Fall Risk Assessment    Had recent fall / Last 6 months? No recent fall    -MD at 06/08/16 1422       Does patient have a fear of falling? No    -MD at 06/08/16 1422         User Key  (r) = Recorded By, (t) = Taken By, (c) = Cosigned By    Initials Name Effective Dates    MD Kennis Carinaonovan, Mary A, MA 07/08/15 -         Physical Exam    GENERAL: Vitals reviewed and patient is in NAD  Pulse 99% on RA BMI 29  HEENT: Neck supple.     LUNGS: Clear to auscultation bilaterally, no wheezes with forced expiration, respirations unlabored.  HEART: Regular rate and rhythm,  no ectopic beats or murmur  Mild carotid bruit on left side   ABDOMEN: Non-tender, no organomegaly, no palpable masses or lesions.   EXTREMITIES: No edema.    SKIN: No rashes, no evidence of skin breakdown,    NEURO:   The patient is AAOX3 and in NAD    Cranial nerves II-XII no focal deficits  Motor 5/5 b/l UE and LE    Light touch grossly intact b/l UE and LE    DTR 2/4 b/l bicep, tricep, patella and Achilles    cerebellum: negative finger to nose b/l    gait wnl    + DIX Hallpike to right side   GU  Has small cyst like lesions above right testicle  Left testicle no masses  Chaperone deferred wife in room  MENTAL STATUS: Alert, normal MS. Answers all questions appropriately.      Assessment:       ICD-10-CM ICD-9-CM    1. BPPV (benign paroxysmal positional vertigo), right H81.11 386.11 Refer to Physical Therapy      meclizine (ANTIVERT) 12.5 MG PO Tablet   2. BMI 29.0-29.9,adult Z68.29 V85.25    3. Renal cyst N28.1 753.10    4. Hyperlipidemia, unspecified hyperlipidemia type E78.5 272.4    5. Bruit of left carotid artery R09.89 785.9 US Carotid Complete          Plan:   1. Dizziness  Refer to PT  Trial of meclizine prn  No current issues  Call if worsening  Check us carotid

## 2016-06-09 NOTE — Progress Notes
?   Chronic kidney disease     kidney stones and cysts   ? Chronic lymphocytic thyroiditis 05/30/2012    Allscripts Description: Hashimoto's Thyroiditis  (Created by Conversion)    ? Hyperlipidemia    ? Hyperlipidemia, unspecified hyperlipidemia type 04/21/2012    Allscripts Description: Hyperlipidemia  (Created by Conversion)    ? Hypertension    ? Hypothyroidism 04/21/2012    Allscripts Description: Hypothyroidism  (Created by Conversion)    ? Lung nodules    ? Methemoglobinemia 2006    per pt, given too much novicaine during dental procedure, was in hosptial 4 days   ? Thyroid disease      Past Surgical History:   Procedure Laterality Date   ? ARTHROSCOPY SHOULDER W/ ROTATOR CUFF REPAIR Right 08/15/2014    ARTHROSCOPY SHOULDER W/ ROTATOR CUFF REPAIR SUBACROMIAL DECOMPRESSION performed by Venancio PoissonBrunelli, [facility] Allen[facility]   ? COLONOSCOPY  2009   ? HERNIA REPAIR     ? LITHOTRIPSY      Surgery Description: Lithotripsy;  Problem Comments: twice (Created by Conversion)   ? OTHER SURGICAL HISTORY      Surgery Description: Surgery Of Male Genitalia Vasectomy;   (Created by Conversion)   ? UMBILICAL HERNIA REPAIR      Surgery Description: Umbilical Hernia Repair - Over Age 62;   (Created by Conversion)   ? UPPER GASTROINTESTINAL ENDOSCOPY  2009   ? URETHRAL STRICTURE DILATATION      Surgery Description: Dilation Of Male Urethral Stricture;  Problem Comments: five times (Created by Conversion)     Family History   Problem Relation Age of Onset   ? Cancer Mother      ovarian   ? Heart Disease Father    ? Hypertension Father    ? Cancer Brother      lymph   ? Eczema Sister      Social History     Social History   ? Marital status: Married     Spouse name: N/A   ? Number of children: N/A   ? Years of education: N/A     Occupational History   ? Not on file.     Social History Main Topics   ? Smoking status: Never Smoker   ? Smokeless tobacco: Never Used   ? Alcohol use No   ? Drug use: No   ? Sexual activity: Not on file

## 2016-06-10 ENCOUNTER — Ambulatory Visit

## 2016-06-10 DIAGNOSIS — E039 Hypothyroidism, unspecified: Secondary | ICD-10-CM

## 2016-06-10 DIAGNOSIS — M1990 Unspecified osteoarthritis, unspecified site: Secondary | ICD-10-CM

## 2016-06-10 DIAGNOSIS — I10 Essential (primary) hypertension: Secondary | ICD-10-CM

## 2016-06-10 DIAGNOSIS — E785 Hyperlipidemia, unspecified: Secondary | ICD-10-CM

## 2016-06-10 DIAGNOSIS — K5790 Diverticulosis of intestine, part unspecified, without perforation or abscess without bleeding: Secondary | ICD-10-CM

## 2016-06-10 DIAGNOSIS — K219 Gastro-esophageal reflux disease without esophagitis: Secondary | ICD-10-CM

## 2016-06-10 DIAGNOSIS — N281 Cyst of kidney, acquired: Secondary | ICD-10-CM

## 2016-06-10 DIAGNOSIS — E063 Autoimmune thyroiditis: Secondary | ICD-10-CM

## 2016-06-10 DIAGNOSIS — R911 Solitary pulmonary nodule: Secondary | ICD-10-CM

## 2016-06-10 DIAGNOSIS — N200 Calculus of kidney: Secondary | ICD-10-CM

## 2016-06-10 DIAGNOSIS — Z79899 Other long term (current) drug therapy: Secondary | ICD-10-CM

## 2016-06-10 DIAGNOSIS — Z1211 Encounter for screening for malignant neoplasm of colon: Principal | ICD-10-CM

## 2016-06-10 DIAGNOSIS — K621 Rectal polyp: Secondary | ICD-10-CM

## 2016-06-10 DIAGNOSIS — D122 Benign neoplasm of ascending colon: Secondary | ICD-10-CM

## 2016-06-10 DIAGNOSIS — K648 Other hemorrhoids: Secondary | ICD-10-CM

## 2016-06-10 DIAGNOSIS — D749 Methemoglobinemia, unspecified: Secondary | ICD-10-CM

## 2016-06-11 DIAGNOSIS — E785 Hyperlipidemia, unspecified: Secondary | ICD-10-CM

## 2016-06-11 DIAGNOSIS — E063 Autoimmune thyroiditis: Secondary | ICD-10-CM

## 2016-06-11 DIAGNOSIS — I10 Essential (primary) hypertension: Principal | ICD-10-CM

## 2016-06-11 DIAGNOSIS — R911 Solitary pulmonary nodule: Secondary | ICD-10-CM

## 2016-06-11 DIAGNOSIS — N281 Cyst of kidney, acquired: Secondary | ICD-10-CM

## 2016-06-11 DIAGNOSIS — E039 Hypothyroidism, unspecified: Secondary | ICD-10-CM

## 2016-06-11 DIAGNOSIS — K5790 Diverticulosis of intestine, part unspecified, without perforation or abscess without bleeding: Secondary | ICD-10-CM

## 2016-06-11 DIAGNOSIS — D749 Methemoglobinemia, unspecified: Secondary | ICD-10-CM

## 2016-06-11 DIAGNOSIS — K219 Gastro-esophageal reflux disease without esophagitis: Secondary | ICD-10-CM

## 2016-06-11 DIAGNOSIS — N200 Calculus of kidney: Secondary | ICD-10-CM

## 2016-06-11 DIAGNOSIS — M1990 Unspecified osteoarthritis, unspecified site: Secondary | ICD-10-CM

## 2016-06-11 MED ORDER — CHOLECALCIFEROL 25 MCG (1000 UT) PO TABS
1000 [IU] | Freq: Every day | ORAL
Start: 2016-06-11 — End: ?

## 2016-06-14 ENCOUNTER — Ambulatory Visit

## 2016-06-14 ENCOUNTER — Inpatient Hospital Stay

## 2016-06-14 DIAGNOSIS — N281 Cyst of kidney, acquired: Secondary | ICD-10-CM

## 2016-06-14 DIAGNOSIS — E785 Hyperlipidemia, unspecified: Secondary | ICD-10-CM

## 2016-06-14 DIAGNOSIS — K219 Gastro-esophageal reflux disease without esophagitis: Secondary | ICD-10-CM

## 2016-06-14 DIAGNOSIS — E063 Autoimmune thyroiditis: Secondary | ICD-10-CM

## 2016-06-14 DIAGNOSIS — R911 Solitary pulmonary nodule: Secondary | ICD-10-CM

## 2016-06-14 DIAGNOSIS — Z1211 Encounter for screening for malignant neoplasm of colon: Principal | ICD-10-CM

## 2016-06-14 DIAGNOSIS — N200 Calculus of kidney: Secondary | ICD-10-CM

## 2016-06-14 DIAGNOSIS — Z79899 Other long term (current) drug therapy: Secondary | ICD-10-CM

## 2016-06-14 DIAGNOSIS — K621 Rectal polyp: Secondary | ICD-10-CM

## 2016-06-14 DIAGNOSIS — I10 Essential (primary) hypertension: Principal | ICD-10-CM

## 2016-06-14 DIAGNOSIS — K648 Other hemorrhoids: Secondary | ICD-10-CM

## 2016-06-14 DIAGNOSIS — M1990 Unspecified osteoarthritis, unspecified site: Secondary | ICD-10-CM

## 2016-06-14 DIAGNOSIS — D749 Methemoglobinemia, unspecified: Secondary | ICD-10-CM

## 2016-06-14 DIAGNOSIS — E039 Hypothyroidism, unspecified: Secondary | ICD-10-CM

## 2016-06-14 DIAGNOSIS — D122 Benign neoplasm of ascending colon: Secondary | ICD-10-CM

## 2016-06-14 DIAGNOSIS — K5790 Diverticulosis of intestine, part unspecified, without perforation or abscess without bleeding: Secondary | ICD-10-CM

## 2016-06-14 LAB — HM COLONOSCOPY

## 2016-06-14 MED ORDER — FENTANYL CITRATE INJ 50 MCG/ML CUSTOM AMP/VIAL
50 ug | INTRAVENOUS | Status: DC | PRN
Start: 2016-06-14 — End: 2016-06-14

## 2016-06-14 MED ORDER — DIPHENHYDRAMINE HCL 50 MG/ML IJ SOLN
12.5 mg | Freq: Once | INTRAVENOUS | Status: DC | PRN
Start: 2016-06-14 — End: 2016-06-14

## 2016-06-14 MED ORDER — LACTATED RINGERS IV SOLN
Freq: Once | INTRAVENOUS | Status: DC
Start: 2016-06-14 — End: 2016-06-14

## 2016-06-14 MED ORDER — LACTATED RINGERS IV SOLN
Status: DC | PRN
Start: 2016-06-14 — End: 2016-06-14

## 2016-06-14 MED ORDER — PROMETHAZINE HCL 25 MG/ML IJ SOLN
6.25 mg | INTRAVENOUS | Status: DC | PRN
Start: 2016-06-14 — End: 2016-06-14

## 2016-06-14 MED ORDER — PROPOFOL 10 MG/ML IV EMUL CUSTOM SH
Status: DC | PRN
Start: 2016-06-14 — End: 2016-06-14

## 2016-06-14 MED ORDER — LIDOCAINE HCL (PF) 1 % IJ SOLN SH
INTRAVENOUS | Status: DC | PRN
Start: 2016-06-14 — End: 2016-06-14

## 2016-06-14 MED ORDER — ONDANSETRON HCL 4 MG/2ML IJ SOLN
4 mg | Freq: Once | INTRAVENOUS | Status: DC | PRN
Start: 2016-06-14 — End: 2016-06-14

## 2016-06-14 MED ORDER — NALOXONE HCL 2 MG/2ML IJ SOSY
.4 mg | INTRAVENOUS | Status: DC | PRN
Start: 2016-06-14 — End: 2016-06-14

## 2016-06-14 NOTE — Anesthesia Preprocedure Evaluation
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

## 2016-06-14 NOTE — Anesthesia Preprocedure Evaluation
Department of Anesthesiology  Pre-operative Evaluation Record    Patient Name: Robert Fischer                  Sex: male                  Age: 66 y.o.                  MRN: [id number]     Allergies:   Oxycodone-acetaminophen    Current Med List   Medication Sig   ? cholecalciferol (VITAMIN D-3) 1000 units PO Tablet Take 1,000 Units by mouth daily.       Prior Surgeries:     Past Surgical History:   Procedure Laterality Date   ? ARTHROSCOPY SHOULDER W/ ROTATOR CUFF REPAIR Right 08/15/2014    ARTHROSCOPY SHOULDER W/ ROTATOR CUFF REPAIR SUBACROMIAL DECOMPRESSION performed by Venancio PoissonBrunelli, Jeffrey Allen[facility]   ? COLONOSCOPY  2009   ? CYSTOURETHROSCOPY     ? LITHOTRIPSY      x2   ? LITHOTRIPSY     ? UMBILICAL HERNIA REPAIR      Surgery Description: Umbilical Hernia Repair - Over Age 83;   (Created by Conversion)   ? UPPER GASTROINTESTINAL ENDOSCOPY  2009   ? URETHRAL STRICTURE DILATATION      Multiple   ? VASECTOMY         Social   ETOH:       Alcohol Use   ? 12.0 oz/week   ? 1 Cans of beer per week     Tobacco:   History   Smoking Status   ? Never Smoker   Smokeless Tobacco   ? Never Used     Drugs:   History   Drug Use No       Evaluation:   ROS / Med Hx:  Negative for review of systems and medical history except otherwise noted below:    Pulmonary: Pulmonary Nodules stable per CT   Cardiovascular: Negative cardio ROS except otherwise noted. Cardiac history includes: hyperlipidemia and hypertension. The hypertension is well controlled. The hypertension has occurred for 6 - 10 years.   GI/Hepatic: Negative for GI/hepatic ROS except otherwise noted. acid reflux. GERD controlled with medication. The GERD occurs infrequently. GI pathology: diverticulosis.   Renal: Renal Cysts; Kidney Stones   Endo/Other: Negative for endo/other ROS except otherwise noted The patient has a history of osteoarthritis. The patient has a history of thyroid problem. The type is hypothyroidism.

## 2016-06-14 NOTE — H&P
?   PEG-3350 and electrolytes (GoLYTELY) 236 g PO Solution Reconstituted Take 4,000 mLs by mouth once for 1 dose.       Current Hospital Medications:  Scheduled:   Continuous Infusions:   ? LR       PRN:   diphenhydrAMINE 12.5 mg Intravenous Once PRN   fentaNYL 50 mcg Intravenous Q5 Min PRN   nalOXone 0.4 mg Intravenous PRN   ondansetron 4 mg Intravenous Once PRN   promethazine 6.25 mg Intravenous Q15 Min PRN        Allergies:  is allergic to oxycodone-acetaminophen.    Review of Systems  Respiratory: Negative  Cardiovascular: Negative  Gastrointestinal: Negative    Airway: Open, uncompromised    NPO since: pm      Physical Exam  Lungs: clear to auscultation bilaterally  Heart: regular rate and rhythm, S1, S2 normal, no murmur, click, rub or gallop  Abdomen: soft, non-tender; bowel sounds normal; no masses, no organomegaly    Assessment / Diagnosis: ok    ASA:  Class I ? normal healthy patient    Previous Anesthesia: MAC    Problems/Reaction: no    Any Contraindications for Sedation?: no    Anesthetic/Sedation Plan: General/MAC Anesthesia requested    Options and risks discussed with Patient    Sedation consent signed and in Medical Record: yes    In light of the above evaluation, I believe this patient is an acceptable candidate for the procedure planned as outlined above.    James S Scolapio  06/14/2016 9:54 AM

## 2016-06-14 NOTE — Progress Notes
Patient arrived to PACU from OR. Patient placed on monitor. Report received from Marnie , RN and CRNA  from anesthesiology. Patient arrived in Stable condition.

## 2016-06-14 NOTE — Anesthesia Preprocedure Evaluation
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   Pulmonary:  On pulmonary examination breath sounds clear to auscultation.   Cardiovascular: On cardiovascular examination regular rate and rhythm.     Anesthesia Plan:  Patient has an ASA score of 2 and patient is NPO. Plan for anesthesia technique is general.  With an intravenous type of induction. Anesthetic plan and risks has been discussed with the patient.     I have performed a pre-anesthesia examination, evaluation, and prescribed the anesthesia plan.

## 2016-06-14 NOTE — H&P
Department of Medicine  Division of Gastroenterology, Hepatology & Nutrition    Procedure Planned: Colonoscopy    Reason for Procedure: Screening for colon cancer-average risk      History: Past Medical History:   Diagnosis Date   ? Chronic lymphocytic thyroiditis 05/30/2012    Allscripts Description: Hashimoto's Thyroiditis  (Created by Conversion)    ? Diverticulosis    ? GERD (gastroesophageal reflux disease)    ? Hyperlipidemia    ? Hypertension    ? Hypothyroidism 04/21/2012    Allscripts Description: Hypothyroidism  (Created by Conversion)    ? Methemoglobinemia 2006    per pt, given too much novocaine during dental procedure, was in hosptial 4 days   ? Nephrolithiasis    ? Osteoarthritis    ? Pulmonary nodule     Stable per CT   ? Renal cyst      Past Surgical History:   Procedure Laterality Date   ? ARTHROSCOPY SHOULDER W/ ROTATOR CUFF REPAIR Right 08/15/2014    ARTHROSCOPY SHOULDER W/ ROTATOR CUFF REPAIR SUBACROMIAL DECOMPRESSION performed by [facility]runelli, [facility]   ? COLONOSCOPY  2009   ? CYSTOURETHROSCOPY     ? LITHOTRIPSY      x2   ? LITHOTRIPSY     ? UMBILICAL HERNIA REPAIR      Surgery Description: Umbilical Hernia Repair - Over Age 24;   (Created by Conversion)   ? UPPER GASTROINTESTINAL ENDOSCOPY  2009   ? URETHRAL STRICTURE DILATATION      Multiple   ? VASECTOMY          I have reviewed the past medical and surgical, history.    Home Medications:  Prescriptions Prior to Admission   Medication Sig   ? amLODIPine (NORVASC) 10 MG Tablet TAKE 1 TABLET DAILY AS DIRECTED   ? atorvastatin (LIPITOR) 20 MG Tablet TAKE 1 TABLET DAILY AT BEDTIME   ? cholecalciferol (VITAMIN D-3) 1000 units PO Tablet Take 1,000 Units by mouth daily.   ? levothyroxine (SYNTHROID, LEVOTHROID) 50 MCG Tablet TAKE 1 TABLET DAILY   ? magnesium oxide (MAG-OX) 400 (241.3 MG) MG tablet Take 400 mg by mouth daily as needed.    ? meclizine (ANTIVERT) 12.5 MG PO Tablet Take 1 tablet by mouth 3 times daily as needed.

## 2016-06-14 NOTE — Progress Notes
Patient is complaining of abdominal pain, abd is distended. Patient encouraged to

## 2016-06-14 NOTE — Progress Notes
Patient is complaining of abdominal pain, abd is distended. Patient encouraged to

## 2016-06-14 NOTE — Progress Notes
Patient discharge instructions and education discussed and verbalized understanding, by patient and Wife , patient driver. Patient discharge instructions given to Wife . Patient left with all personal belongings. Patient ambulated to wheelchair with steady gait. Patient escorted to vehicle via wheelchair. Patient condition at discharge: Stable.

## 2016-06-14 NOTE — H&P
?   PEG-3350 and electrolytes (GoLYTELY) 236 g PO Solution Reconstituted Take 4,000 mLs by mouth once for 1 dose.       Current Hospital Medications:  Scheduled:   Continuous Infusions:   ? LR       PRN:   diphenhydrAMINE 12.5 mg Intravenous Once PRN   fentaNYL 50 mcg Intravenous Q5 Min PRN   nalOXone 0.4 mg Intravenous PRN   ondansetron 4 mg Intravenous Once PRN   promethazine 6.25 mg Intravenous Q15 Min PRN        Allergies:  is allergic to oxycodone-acetaminophen.    Review of Systems  Respiratory: Negative  Cardiovascular: Negative  Gastrointestinal: Negative    Airway: Open, uncompromised    NPO since: pm      Physical Exam  Lungs: clear to auscultation bilaterally  Heart: regular rate and rhythm, S1, S2 normal, no murmur, click, rub or gallop  Abdomen: soft, non-tender; bowel sounds normal; no masses, no organomegaly    Assessment / Diagnosis: ok    ASA:  Class I ? normal healthy patient    Previous Anesthesia: MAC    Problems/Reaction: no    Any Contraindications for Sedation?: no    Anesthetic/Sedation Plan: General/MAC Anesthesia requested    Options and risks discussed with Patient    Sedation consent signed and in [place]    In light of the above evaluation, I believe this patient is an acceptable candidate for the procedure planned as outlined above.    Drue Stager Scolapio  06/14/2016 9:54 AM

## 2016-06-14 NOTE — Progress Notes
Patient is complaining of abdominal pain, abd is distended. Patient encouraged to walk and sit on toilet. Patient started passing gas with relief. Dr. Scolapio made aware assessed patient and feels patient is good to be discharged.

## 2016-06-14 NOTE — H&P
Department of Medicine  Division of Gastroenterology, Hepatology & Nutrition    Procedure Planned: Colonoscopy    Reason for Procedure: Screening for colon cancer-average risk      History: Past Medical History:   Diagnosis Date   ? Chronic lymphocytic thyroiditis 05/30/2012    Allscripts Description: Hashimoto's Thyroiditis  (Created by Conversion)    ? Diverticulosis    ? GERD (gastroesophageal reflux disease)    ? Hyperlipidemia    ? Hypertension    ? Hypothyroidism 04/21/2012    Allscripts Description: Hypothyroidism  (Created by Conversion)    ? Methemoglobinemia 2006    per pt, given too much novocaine during dental procedure, was in hosptial 4 days   ? Nephrolithiasis    ? Osteoarthritis    ? Pulmonary nodule     Stable per CT   ? Renal cyst      Past Surgical History:   Procedure Laterality Date   ? ARTHROSCOPY SHOULDER W/ ROTATOR CUFF REPAIR Right 08/15/2014    ARTHROSCOPY SHOULDER W/ ROTATOR CUFF REPAIR SUBACROMIAL DECOMPRESSION performed by Brunelli, Jeffrey Allen, MD at [facility]   ? COLONOSCOPY  2009   ? CYSTOURETHROSCOPY     ? LITHOTRIPSY      x2   ? LITHOTRIPSY     ? UMBILICAL HERNIA REPAIR      Surgery Description: Umbilical Hernia Repair - Over Age 6;   (Created by Conversion)   ? UPPER GASTROINTESTINAL ENDOSCOPY  2009   ? URETHRAL STRICTURE DILATATION      Multiple   ? VASECTOMY          I have reviewed the past medical and surgical, history.    Home Medications:  Prescriptions Prior to Admission   Medication Sig   ? amLODIPine (NORVASC) 10 MG Tablet TAKE 1 TABLET DAILY AS DIRECTED   ? atorvastatin (LIPITOR) 20 MG Tablet TAKE 1 TABLET DAILY AT BEDTIME   ? cholecalciferol (VITAMIN D-3) 1000 units PO Tablet Take 1,000 Units by mouth daily.   ? levothyroxine (SYNTHROID, LEVOTHROID) 50 MCG Tablet TAKE 1 TABLET DAILY   ? magnesium oxide (MAG-OX) 400 (241.3 MG) MG tablet Take 400 mg by mouth daily as needed.    ? meclizine (ANTIVERT) 12.5 MG PO Tablet Take 1 tablet by mouth 3 times daily as needed.

## 2016-06-14 NOTE — Progress Notes
Patient is complaining of abdominal pain, abd is distended. Patient encouraged to walk and sit on toilet. Patient started passing gas with relief. Dr. Cala BradfordScolapio made aware assessed patient and feels patient is good to be discharged.

## 2016-06-14 NOTE — Anesthesia Postprocedure Evaluation
Post Anesthesia Eval    Respiratory function appropriate( normal respiratory rate and oxygen saturation, airway patent )  Cardiovascular function appropriate( normal heart rate and blood pressure )  Mental status appropriate  Patient normothermic  Pain well controlled  No uncontrolled nausea and vomiting  Patient appropriately hydrated  No apparent anesthesia complications  Patient tolerated procedure well

## 2016-06-15 DIAGNOSIS — E785 Hyperlipidemia, unspecified: Secondary | ICD-10-CM

## 2016-06-15 DIAGNOSIS — E063 Autoimmune thyroiditis: Secondary | ICD-10-CM

## 2016-06-15 DIAGNOSIS — E039 Hypothyroidism, unspecified: Secondary | ICD-10-CM

## 2016-06-15 DIAGNOSIS — K5790 Diverticulosis of intestine, part unspecified, without perforation or abscess without bleeding: Secondary | ICD-10-CM

## 2016-06-15 DIAGNOSIS — N281 Cyst of kidney, acquired: Secondary | ICD-10-CM

## 2016-06-15 DIAGNOSIS — R911 Solitary pulmonary nodule: Secondary | ICD-10-CM

## 2016-06-15 DIAGNOSIS — D749 Methemoglobinemia, unspecified: Secondary | ICD-10-CM

## 2016-06-15 DIAGNOSIS — N200 Calculus of kidney: Secondary | ICD-10-CM

## 2016-06-15 DIAGNOSIS — I10 Essential (primary) hypertension: Principal | ICD-10-CM

## 2016-06-15 DIAGNOSIS — K219 Gastro-esophageal reflux disease without esophagitis: Secondary | ICD-10-CM

## 2016-06-15 DIAGNOSIS — M1990 Unspecified osteoarthritis, unspecified site: Secondary | ICD-10-CM

## 2016-06-17 ENCOUNTER — Inpatient Hospital Stay: Admit: 2016-06-17 | Discharge: 2016-06-18

## 2016-06-17 DIAGNOSIS — R0989 Other specified symptoms and signs involving the circulatory and respiratory systems: Secondary | ICD-10-CM

## 2016-06-17 DIAGNOSIS — N442 Benign cyst of testis: Secondary | ICD-10-CM

## 2016-06-17 DIAGNOSIS — N433 Hydrocele, unspecified: Principal | ICD-10-CM

## 2016-07-01 ENCOUNTER — Inpatient Hospital Stay: Admit: 2016-07-01 | Discharge: 2016-07-03

## 2016-07-01 NOTE — Progress Notes
Date of Service:  07/01/2016    Name,DOB,MRN: Robert Fischer, 10-10-1950, [id number]    Visit Information:  ? Visit Number:   ? Left Without Being Seen Number:   ? Canceled Number:   ? No Show Number: 1  ? Comment: Pt did not show for scheduled initial physical therapy evaluation    * Was an interpreter used: NA    ________________________  Ignacia Felling, PT

## 2016-08-02 ENCOUNTER — Encounter: Attending: Family Medicine

## 2016-08-31 ENCOUNTER — Ambulatory Visit (INDEPENDENT_AMBULATORY_CARE_PROVIDER_SITE_OTHER): Payer: 59 | Admitting: Adult Health

## 2016-08-31 ENCOUNTER — Encounter: Payer: Self-pay | Admitting: Adult Health

## 2016-08-31 VITALS — BP 146/78 | HR 64 | Ht 69.0 in | Wt 202.7 lb

## 2016-08-31 DIAGNOSIS — Z0000 Encounter for general adult medical examination without abnormal findings: Secondary | ICD-10-CM

## 2016-08-31 DIAGNOSIS — K469 Unspecified abdominal hernia without obstruction or gangrene: Secondary | ICD-10-CM | POA: Diagnosis not present

## 2016-08-31 DIAGNOSIS — I10 Essential (primary) hypertension: Secondary | ICD-10-CM

## 2016-08-31 DIAGNOSIS — E785 Hyperlipidemia, unspecified: Secondary | ICD-10-CM

## 2016-08-31 DIAGNOSIS — N200 Calculus of kidney: Secondary | ICD-10-CM

## 2016-08-31 NOTE — Assessment & Plan Note (Addendum)
Continue all medications as directed. Continue excellent water intake and heart healthy diet. We have requested medical records from previous PCP in [place]. Follow-up in 3 months, or sooner if needed.

## 2016-08-31 NOTE — Patient Instructions (Addendum)
Heart-Healthy Eating Plan Many factors influence your heart health, including eating and exercise habits. Heart (coronary) risk increases with abnormal blood fat (lipid) levels. Heart-healthy meal planning includes limiting unhealthy fats, increasing healthy fats, and making other small dietary changes. This includes maintaining a healthy body weight to help keep lipid levels within a normal range. What is my plan? Your health care provider recommends that you:  Get no more than _________% of the total calories in your daily diet from fat.  Limit your intake of saturated fat to less than _________% of your total calories each day.  Limit the amount of cholesterol in your diet to less than _________ mg per day. What types of fat should I choose?  Choose healthy fats more often. Choose monounsaturated and polyunsaturated fats, such as olive oil and canola oil, flaxseeds, walnuts, almonds, and seeds.  Eat more omega-3 fats. Good choices include salmon, mackerel, sardines, tuna, flaxseed oil, and ground flaxseeds. Aim to eat fish at least two times each week.  Limit saturated fats. Saturated fats are primarily found in animal products, such as meats, butter, and cream. Plant sources of saturated fats include palm oil, palm kernel oil, and coconut oil.  Avoid foods with partially hydrogenated oils in them. These contain trans fats. Examples of foods that contain trans fats are stick margarine, some tub margarines, cookies, crackers, and other baked goods. What general guidelines do I need to follow?  Check food labels carefully to identify foods with trans fats or high amounts of saturated fat.  Fill one half of your plate with vegetables and green salads. Eat 4-5 servings of vegetables per day. A serving of vegetables equals 1 cup of raw leafy vegetables,  cup of raw or cooked cut-up vegetables, or  cup of vegetable juice.  Fill one fourth of your plate with whole grains. Look for the word  "whole" as the first word in the ingredient list.  Fill one fourth of your plate with lean protein foods.  Eat 4-5 servings of fruit per day. A serving of fruit equals one medium whole fruit,  cup of dried fruit,  cup of fresh, frozen, or canned fruit, or  cup of 100% fruit juice.  Eat more foods that contain soluble fiber. Examples of foods that contain this type of fiber are apples, broccoli, carrots, beans, peas, and barley. Aim to get 20-30 g of fiber per day.  Eat more home-cooked food and less restaurant, buffet, and fast food.  Limit or avoid alcohol.  Limit foods that are high in starch and sugar.  Avoid fried foods.  Cook foods by using methods other than frying. Baking, boiling, grilling, and broiling are all great options. Other fat-reducing suggestions include:  Removing the skin from poultry.  Removing all visible fats from meats.  Skimming the fat off of stews, soups, and gravies before serving them.  Steaming vegetables in water or broth.  Lose weight if you are overweight. Losing just 5-10% of your initial body weight can help your overall health and prevent diseases such as diabetes and heart disease.  Increase your consumption of nuts, legumes, and seeds to 4-5 servings per week. One serving of dried beans or legumes equals  cup after being cooked, one serving of nuts equals 1 ounces, and one serving of seeds equals  ounce or 1 tablespoon.  You may need to monitor your salt (sodium) intake, especially if you have high blood pressure. Talk with your health care provider or dietitian to get more  information about reducing sodium. What foods can I eat? Grains   Breads, including [place], white, pita, wheat, raisin, rye, oatmeal, and [place]. Tortillas that are neither fried nor made with lard or trans fat. Low-fat rolls, including hotdog and hamburger buns and English muffins. Biscuits. Muffins. Waffles. Pancakes. Light popcorn. Whole-grain cereals. Flatbread.  Melba toast. Pretzels. Breadsticks. Rusks. Low-fat snacks and crackers, including oyster, saltine, matzo, graham, animal, and rye. Rice and pasta, including [place] rice and those that are made with whole wheat. Vegetables  All vegetables. Fruits  All fruits, but limit coconut. Meats and Other Protein Sources  Lean, well-trimmed beef, veal, pork, and lamb. Chicken and [place] without skin. All fish and shellfish. Wild duck, rabbit, pheasant, and venison. Egg whites or low-cholesterol egg substitutes. Dried beans, peas, lentils, and tofu.Seeds and most nuts. Dairy  Low-fat or nonfat cheeses, including ricotta, string, and mozzarella. Skim or 1% milk that is liquid, powdered, or evaporated. Buttermilk that is made with low-fat milk. Nonfat or low-fat yogurt. Beverages  Mineral water. Diet carbonated beverages. Sweets and Desserts  Sherbets and fruit ices. Honey, jam, marmalade, jelly, and syrups. Meringues and gelatins. Pure sugar candy, such as hard candy, jelly beans, gumdrops, mints, marshmallows, and small amounts of dark chocolate. [employer] Eat all sweets and desserts in moderation. Fats and Oils  Nonhydrogenated (trans-free) margarines. Vegetable oils, including soybean, sesame, sunflower, olive, peanut, safflower, corn, canola, and cottonseed. Salad dressings or mayonnaise that are made with a vegetable oil. Limit added fats and oils that you use for cooking, baking, salads, and as spreads. Other  Cocoa powder. Coffee and tea. All seasonings and condiments. The items listed above may not be a complete list of recommended foods or beverages. Contact your dietitian for more options.  What foods are not recommended? Grains  Breads that are made with saturated or trans fats, oils, or whole milk. Croissants. Butter rolls. Cheese breads. Sweet rolls. Donuts. Buttered popcorn. Chow mein noodles. High-fat crackers, such as cheese or butter crackers. Meats and Other Protein Sources  Fatty  meats, such as hotdogs, short ribs, sausage, spareribs, bacon, ribeye roast or steak, and mutton. High-fat deli meats, such as salami and bologna. Caviar. Domestic duck and goose. Organ meats, such as kidney, liver, sweetbreads, brains, gizzard, chitterlings, and heart. Dairy  Cream, sour cream, cream cheese, and creamed cottage cheese. Whole milk cheeses, including blue (bleu), Monterey Jack, [place], [place], American, [place], Swiss, [place], [place], and [place]. Whole or 2% milk that is liquid, evaporated, or condensed. Whole buttermilk. Cream sauce or high-fat cheese sauce. Yogurt that is made from whole milk. Beverages  Regular sodas and drinks with added sugar. Sweets and Desserts  Frosting. Pudding. Cookies. Cakes other than angel food cake. Candy that has milk chocolate or white chocolate, hydrogenated fat, butter, coconut, or unknown ingredients. Buttered syrups. Full-fat ice cream or ice cream drinks. Fats and Oils  Gravy that has suet, meat fat, or shortening. Cocoa butter, hydrogenated oils, palm oil, coconut oil, palm kernel oil. These can often be found in baked products, candy, fried foods, nondairy creamers, and whipped toppings. Solid fats and shortenings, including bacon fat, salt pork, lard, and butter. Nondairy cream substitutes, such as coffee creamers and sour cream substitutes. Salad dressings that are made of unknown oils, cheese, or sour cream. The items listed above may not be a complete list of foods and beverages to avoid. Contact your dietitian for more information.  This information is not intended to replace advice given to you by  your health care provider. Make sure you discuss any questions you have with your health care provider. Document Released: 12/30/2007 Document Revised: 10/10/2015 Document Reviewed: 09/13/2013 Elsevier Interactive Patient Education  2017 [place].   Referrals placed for  Nephrology and General Surgery. Continue all medications as  directed. Continue excellent water intake and heart healthy diet. We have requested medical records from previous PCP in [place]. Follow-up in 3 months, or sooner if needed.

## 2016-08-31 NOTE — Assessment & Plan Note (Signed)
Abdominal hernia repair > 25 years ago. Abdominal hernia re-developed > 20 years ago. Referral placed for General Surgery. Use good body mechanics, especially when lifting.

## 2016-08-31 NOTE — Progress Notes (Signed)
Subjective:    Patient ID: Jordan Evans, male    DOB: [birth year], 66 y.o.   MRN: [id number]  HPI :  Jordan Evans presents to establish as a new pt.  He is a pleasant, retired 66 year old.  PMH:  HTN, hx of renal calculi, hypothyroidism, HL, large abdominal hernia, and "testicular cysts that developed >5 years ago.  He also reports "spots on my lungs" that were detected from [place] seen by pulmonology 2015.  He denies fever/night sweats/malaise/poor appetite/hemoptysis.  We have requested records from previous PCP in [place].  He denies tobacco/ETOH use.  He denies family hx of lung ca.  He had abdominal hernia repaired >25 years ago and hernia re-developed > 20 years ago.  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  His wife, who is a Therapist, sports, was at [facility] during [place].  Patient Care Team    Relationship Specialty Notifications Start End  Esaw Grandchild, NP PCP - General Family Medicine  08/31/16     Patient Active Problem List   Diagnosis Date Noted  . Health care maintenance 08/31/2016  . Kidney calculi 08/31/2016  . Hernia of abdominal cavity 08/31/2016  . HTN (hypertension) 08/31/2016  . Hyperlipidemia 08/31/2016     Past Medical History:  Diagnosis Date  . Hyperlipidemia   . Hypertension   . Thyroid disease      Past Surgical History:  Procedure Laterality Date  . LITHOTRIPSY    . ROTATOR CUFF REPAIR Right   . VASECTOMY       Family History  Problem Relation Age of Onset  . Cancer Mother        ovarian  . Hyperlipidemia Mother   . Hypertension Mother   . Heart attack Father   . Hyperlipidemia Father   . Hypertension Father   . Hyperlipidemia Sister   . Hypertension Sister   . Cancer Brother        lymph  . Hyperlipidemia Brother   . Hypertension Brother   . Diabetes Paternal Grandmother   . Hyperlipidemia Brother   . Hypertension Brother   . Hyperlipidemia Brother   . Hypertension Brother   .  Hyperlipidemia Brother   . Hypertension Brother      History  Drug Use No     History  Alcohol Use  . Yes    Comment: rarely     History  Smoking Status  . Never Smoker  Smokeless Tobacco  . Never Used     Outpatient Encounter Prescriptions as of 08/31/2016  Medication Sig  . amLODipine (NORVASC) 10 MG tablet Take 10 mg by mouth daily.  Marland Kitchen atorvastatin (LIPITOR) 20 MG tablet Take 20 mg by mouth daily.  Marland Kitchen levothyroxine (SYNTHROID, LEVOTHROID) 50 MCG tablet Take 50 mcg by mouth daily before breakfast.  . Vitamin D, Cholecalciferol, 1000 units TABS Take 1 tablet by mouth daily.   No facility-administered encounter medications on file as of 08/31/2016.     Allergies: Percocet [oxycodone-acetaminophen]  Body mass index is 29.93 kg/m.  Blood pressure (!) 146/78, pulse 64, height 5\' 9"  (1.753 m), weight 202 lb 11.2 oz (91.9 kg).    Review of Systems  Constitutional: Positive for fatigue. Negative for activity change, appetite change, chills, diaphoresis, fever and unexpected weight change.  HENT: Negative for congestion.   Eyes: Negative for visual disturbance.  Respiratory: Negative for cough, chest tightness, shortness of breath, wheezing and stridor.   Cardiovascular:  Negative for chest pain, palpitations and leg swelling.  Gastrointestinal: Negative for abdominal distention, abdominal pain, blood in stool, constipation, diarrhea, nausea and vomiting.  Endocrine: Positive for cold intolerance. Negative for heat intolerance, polydipsia, polyphagia and polyuria.  Genitourinary: Positive for testicular pain. Negative for decreased urine volume, difficulty urinating, discharge, flank pain, hematuria and urgency.  Musculoskeletal: Negative for arthralgias, back pain, gait problem, joint swelling, myalgias, neck pain and neck stiffness.  Skin: Negative for color change, pallor, rash and wound.  Allergic/Immunologic: Negative for immunocompromised state.  Neurological:  Negative for dizziness, tremors, weakness, light-headedness and headaches.  Hematological: Negative for adenopathy. Does not bruise/bleed easily.  Psychiatric/Behavioral: Negative for agitation, dysphoric mood, self-injury, sleep disturbance and suicidal ideas. The patient is not nervous/anxious and is not hyperactive.        Objective:   Physical Exam  Constitutional: He is oriented to person, place, and time. He appears well-developed and well-nourished. No distress.  HENT:  Head: Normocephalic and atraumatic.  Right Ear: Hearing, tympanic membrane, external ear and ear canal normal. No decreased hearing is noted.  Left Ear: Hearing, tympanic membrane, external ear and ear canal normal. No decreased hearing is noted.  Nose: Nose normal. Right sinus exhibits no maxillary sinus tenderness and no frontal sinus tenderness. Left sinus exhibits no maxillary sinus tenderness and no frontal sinus tenderness.  Mouth/Throat: Uvula is midline.  Eyes: Conjunctivae are normal. Pupils are equal, round, and reactive to light.  Neck: Normal range of motion. Neck supple.  Cardiovascular: Normal rate, regular rhythm, normal heart sounds and intact distal pulses.   No murmur heard. Pulmonary/Chest: Effort normal and breath sounds normal. No respiratory distress. He has no wheezes. He has no rales. He exhibits no tenderness.  Abdominal: Soft. He exhibits no distension. There is no hepatosplenomegaly. There is no tenderness. There is no rebound, no guarding and no CVA tenderness. A hernia is present. Hernia confirmed positive in the ventral area.  Musculoskeletal: Normal range of motion.  Lymphadenopathy:    He has no cervical adenopathy.  Neurological: He is alert and oriented to person, place, and time. Coordination normal.  Skin: Skin is warm and dry. No rash noted. No erythema. No pallor.  Psychiatric: He has a normal mood and affect. His behavior is normal. Judgment and thought content normal.  Nursing  note and vitals reviewed.         Assessment & Plan:   1. Kidney calculi   2. Hernia of abdominal cavity   3. Health care maintenance   4. Essential hypertension   5. Hyperlipidemia, unspecified hyperlipidemia type     Health care maintenance Continue all medications as directed. Continue excellent water intake and heart healthy diet. We have requested medical records from previous PCP in [place]. Follow-up in 3 months, or sooner if needed.  Kidney calculi Referral placed for Nephrology.  Continue excellent water intake.   Hernia of abdominal cavity Abdominal hernia repair > 25 years ago. Abdominal hernia re-developed > 20 years ago. Referral placed for General Surgery. Use good body mechanics, especially when lifting.   HTN (hypertension) Continue amlodipine 10mg  daily. Heart healthy diet.  Hyperlipidemia Continue atorvastatin 20mg  daily. Denies myalgia's. Heart Healthy Diet.    FOLLOW-UP:  Return in about 3 months (around 12/01/2016) for Regular Follow Up, HTN.

## 2016-08-31 NOTE — Assessment & Plan Note (Signed)
Referral placed for Nephrology.  Continue excellent water intake.

## 2016-08-31 NOTE — Assessment & Plan Note (Signed)
Continue atorvastatin 20mg  daily. Denies myalgia's. Heart Healthy Diet.

## 2016-08-31 NOTE — Assessment & Plan Note (Signed)
Continue amlodipine 10mg  daily. Heart healthy diet.

## 2016-09-16 ENCOUNTER — Other Ambulatory Visit: Payer: Self-pay | Admitting: Adult Health

## 2016-09-21 ENCOUNTER — Telehealth: Payer: Self-pay | Admitting: Adult Health

## 2016-09-21 ENCOUNTER — Other Ambulatory Visit: Payer: Self-pay | Admitting: Adult Health

## 2016-09-21 DIAGNOSIS — I10 Essential (primary) hypertension: Secondary | ICD-10-CM

## 2016-09-21 DIAGNOSIS — E785 Hyperlipidemia, unspecified: Secondary | ICD-10-CM

## 2016-09-21 DIAGNOSIS — E039 Hypothyroidism, unspecified: Secondary | ICD-10-CM

## 2016-09-21 MED ORDER — LEVOTHYROXINE SODIUM 50 MCG PO TABS: 50.0000 ug | ORAL_TABLET | Freq: Every day | ORAL | 2 refills | Status: DC

## 2016-09-21 MED ORDER — AMLODIPINE BESYLATE 10 MG PO TABS: 10.0000 mg | ORAL_TABLET | Freq: Every day | ORAL | 2 refills | Status: DC

## 2016-09-21 MED ORDER — ATORVASTATIN CALCIUM 20 MG PO TABS: 20.0000 mg | ORAL_TABLET | Freq: Every day | ORAL | 2 refills | Status: DC

## 2016-09-21 NOTE — Telephone Encounter (Signed)
Patient needs Levothyroxine, amlodipine, lipitor refilled.  Please advise.

## 2016-09-21 NOTE — Telephone Encounter (Signed)
Please call Jordan Evans and share that his meds have been called in. Thanks! Valetta Fuller

## 2016-09-21 NOTE — Telephone Encounter (Signed)
Patient came in stating that his wife will be switching jobs so there will be a period of time when they will not have insurance and wants to know if he can get a refill on all meds to cover himself until the new insurance kicks in.

## 2016-09-22 MED FILL — ATORVASTATIN 20 MG TABLET: 20 | 90 days supply | Qty: 90 | Fill #0 | Status: TO

## 2016-09-22 MED FILL — LEVOTHYROXINE 50 MCG TABLET: 50 | 90 days supply | Qty: 90 | Fill #0 | Status: TO

## 2016-09-22 MED FILL — AMLODIPINE BESYLATE 10 MG T: 10 | 90 days supply | Qty: 90 | Fill #0 | Status: TO

## 2016-09-22 NOTE — Telephone Encounter (Signed)
Called patient notified.  MPulliam, CMA

## 2016-11-04 NOTE — Progress Notes (Signed)
If you click under "procedures" and click on the scanned document you will be able to click the arrow button in the top left and see the second page of the report.

## 2016-12-01 ENCOUNTER — Ambulatory Visit: Payer: 59 | Admitting: Adult Health

## 2016-12-14 ENCOUNTER — Encounter: Payer: Self-pay | Admitting: Adult Health

## 2016-12-14 ENCOUNTER — Ambulatory Visit (INDEPENDENT_AMBULATORY_CARE_PROVIDER_SITE_OTHER): Payer: BLUE CROSS/BLUE SHIELD | Admitting: Adult Health

## 2016-12-14 VITALS — BP 136/70 | HR 64 | Ht 69.0 in | Wt 198.4 lb

## 2016-12-14 DIAGNOSIS — I10 Essential (primary) hypertension: Secondary | ICD-10-CM | POA: Diagnosis not present

## 2016-12-14 DIAGNOSIS — E785 Hyperlipidemia, unspecified: Secondary | ICD-10-CM | POA: Diagnosis not present

## 2016-12-14 DIAGNOSIS — Z87898 Personal history of other specified conditions: Secondary | ICD-10-CM

## 2016-12-14 DIAGNOSIS — K439 Ventral hernia without obstruction or gangrene: Secondary | ICD-10-CM | POA: Diagnosis not present

## 2016-12-14 DIAGNOSIS — B379 Candidiasis, unspecified: Secondary | ICD-10-CM

## 2016-12-14 DIAGNOSIS — Z0000 Encounter for general adult medical examination without abnormal findings: Secondary | ICD-10-CM | POA: Diagnosis not present

## 2016-12-14 MED ORDER — FLUCONAZOLE 150 MG PO TABS: 150.0000 mg | ORAL_TABLET | Freq: Once | ORAL | 0 refills | Status: AC

## 2016-12-14 MED ORDER — AMLODIPINE BESYLATE 5 MG PO TABS: 5.0000 mg | ORAL_TABLET | Freq: Every day | ORAL | 0 refills | Status: DC

## 2016-12-14 MED FILL — AMLODIPINE BESYLATE 5 MG TA: 5 | 30 days supply | Qty: 30 | Fill #0 | Status: TO

## 2016-12-14 MED FILL — FLUCONAZOLE 150 MG TABLET: 150 | 1 days supply | Qty: 1 | Fill #0

## 2016-12-14 NOTE — Assessment & Plan Note (Signed)
Reviewed CT Chest w/o Con completed 09/25/15 He denies pulmonary sx's, he never used tobacco. He was [employer] for >23 years and exposed to chemical particulates.

## 2016-12-14 NOTE — Progress Notes (Signed)
Subjective:    Patient ID: Jordan Evans, male    DOB: 1950/12/06, 66 y.o.   MRN: [id number]  HPI:  Mr. Luce is here for f/u: HTN. He has been experiencing daily/brief dizziness (lasts for <53min), associated with position change, has been occurring >6 months.  He denies syncope or falls. Home BP readings: SBP 90-120, DBP 60-70 He has been on Amlodipine 10mg  >10 years. He denies CP/dyspnea/palpitations. He denies tobacco or excessive EOTH use. He has 2 acute issues: 1) Ventral hernia repair 1996, revision to remove protruding mesh in 2000.  12 months ago, one small/painful/darkened/hard area developed over umbilicus. 2) Itching with "white stuff" around tip of of penis that would worsen after sex with his wife.  This became so uncomfortable that they stopped having sex in April.  He denies swollen inguinal lymph nodes or hematuria.  He has existing appt with urology 01/05/17.  Patient Care Team    Relationship Specialty Notifications Start End  Esaw Grandchild, NP PCP - General Family Medicine  08/31/16     Patient Active Problem List   Diagnosis Date Noted  . Health care maintenance 08/31/2016  . Kidney calculi 08/31/2016  . Hernia of abdominal cavity 08/31/2016  . HTN (hypertension) 08/31/2016  . Hyperlipidemia 08/31/2016     Past Medical History:  Diagnosis Date  . Hyperlipidemia   . Hypertension   . Thyroid disease      Past Surgical History:  Procedure Laterality Date  . LITHOTRIPSY    . ROTATOR CUFF REPAIR Right   . VASECTOMY       Family History  Problem Relation Age of Onset  . Cancer Mother        ovarian  . Hyperlipidemia Mother   . Hypertension Mother   . Heart attack Father   . Hyperlipidemia Father   . Hypertension Father   . Hyperlipidemia Sister   . Hypertension Sister   . Cancer Brother        lymph  . Hyperlipidemia Brother   . Hypertension Brother   . Diabetes Paternal Grandmother   . Hyperlipidemia Brother   . Hypertension Brother   .  Hyperlipidemia Brother   . Hypertension Brother   . Hyperlipidemia Brother   . Hypertension Brother      History  Drug Use No     History  Alcohol Use  . Yes    Comment: rarely     History  Smoking Status  . Never Smoker  Smokeless Tobacco  . Never Used     Outpatient Encounter Prescriptions as of 12/14/2016  Medication Sig  . amLODipine (NORVASC) 5 MG tablet Take 1 tablet (5 mg total) by mouth daily.  Marland Kitchen atorvastatin (LIPITOR) 20 MG tablet Take 1 tablet (20 mg total) by mouth daily.  Marland Kitchen levothyroxine (SYNTHROID, LEVOTHROID) 50 MCG tablet Take 1 tablet (50 mcg total) by mouth daily before breakfast.  . [DISCONTINUED] amLODipine (NORVASC) 10 MG tablet Take 1 tablet (10 mg total) by mouth daily.  . [DISCONTINUED] Vitamin D, Cholecalciferol, 1000 units TABS Take 1 tablet by mouth daily.  . fluconazole (DIFLUCAN) 150 MG tablet Take 1 tablet (150 mg total) by mouth once.   No facility-administered encounter medications on file as of 12/14/2016.     Allergies: Percocet [oxycodone-acetaminophen]  Body mass index is 29.3 kg/m.  Blood pressure 136/70, pulse 64, height 5\' 9"  (1.753 m), weight 198 lb 6.4 oz (90 kg).    Review of Systems  Constitutional: Positive for fatigue. Negative for  activity change, appetite change, chills, diaphoresis, fever and unexpected weight change.  Respiratory: Negative for cough, chest tightness, shortness of breath, wheezing and stridor.   Cardiovascular: Negative for chest pain, palpitations and leg swelling.  Gastrointestinal: Negative for abdominal distention, abdominal pain, blood in stool, constipation, diarrhea, nausea and vomiting.  Genitourinary: Positive for flank pain, penile swelling and testicular pain. Negative for decreased urine volume, difficulty urinating and urgency.  Neurological: Positive for dizziness.  Hematological: Does not bruise/bleed easily.       Objective:   Physical Exam  Constitutional: He appears  well-developed and well-nourished. No distress.  HENT:  Head: Normocephalic and atraumatic.  Right Ear: External ear normal.  Left Ear: External ear normal.  Cardiovascular: Normal rate, regular rhythm, normal heart sounds and intact distal pulses.   No murmur heard. Pulmonary/Chest: Effort normal and breath sounds normal. He has no wheezes. He has no rales. He exhibits no tenderness.  Abdominal: He exhibits no distension. There is tenderness. There is rebound and guarding. A hernia is present. Hernia confirmed positive in the ventral area.    One small/darkened area on L/upper umbilicus  Genitourinary: Testes normal. Penile erythema present. No penile tenderness. No discharge found.  Genitourinary Comments: Chaperone at [facility] during examination  Skin: Skin is warm and dry. No rash noted. He is not diaphoretic. There is erythema. No pallor.  Psychiatric: He has a normal mood and affect. His behavior is normal. Judgment and thought content normal.          Assessment & Plan:   1. Ventral hernia without obstruction or gangrene   2. Hyperlipidemia, unspecified hyperlipidemia type   3. Essential hypertension   4. Health care maintenance   5. Yeast infection   6. H/O multiple pulmonary nodules     Hyperlipidemia Will check lipid panel at CPE this winter  HTN (hypertension) BP at goal 136/70 He has been experiencing daily/brief dizziness, associated with position change. Home BP SBP 90-120, DBP 60-70 He has been on Amlodipine 10mg  >10 years. Reduced dosage to 5mg . Continue to check BP and annotate when/if dizziness occurs.   Health care maintenance Increase water intake to at least 100 ounces/day. CPE with fasting labs this fall/winter.   Yeast infection 1 dose of Diflucan. F/u with Urologist.  H/O multiple pulmonary nodules Reviewed CT Chest w/o Con completed 09/25/15 He denies pulmonary sx's, he never used tobacco. He was [employer] for >23 years and exposed to chemical  particulates.     FOLLOW-UP:  Return in about 3 months (around 03/15/2017) for CPE, Fasting Lab Draw.

## 2016-12-14 NOTE — Assessment & Plan Note (Signed)
Will check lipid panel at CPE this winter

## 2016-12-14 NOTE — Patient Instructions (Signed)

## 2016-12-14 NOTE — Assessment & Plan Note (Addendum)
Increase water intake to at least 100 ounces/day. CPE with fasting labs this fall/winter.

## 2016-12-14 NOTE — Assessment & Plan Note (Signed)
BP at goal 136/70 He has been experiencing daily/brief dizziness, associated with position change. Home BP SBP 90-120, DBP 60-70 He has been on Amlodipine 10mg  >10 years. Reduced dosage to 5mg . Continue to check BP and annotate when/if dizziness occurs.

## 2016-12-14 NOTE — Assessment & Plan Note (Signed)
1 dose of Diflucan. F/u with Urologist.

## 2017-01-05 DIAGNOSIS — M6208 Separation of muscle (nontraumatic), other site: Secondary | ICD-10-CM | POA: Diagnosis not present

## 2017-01-05 DIAGNOSIS — Z4889 Encounter for other specified surgical aftercare: Secondary | ICD-10-CM | POA: Diagnosis not present

## 2017-01-06 DIAGNOSIS — N200 Calculus of kidney: Secondary | ICD-10-CM | POA: Diagnosis not present

## 2017-01-06 DIAGNOSIS — N281 Cyst of kidney, acquired: Secondary | ICD-10-CM | POA: Diagnosis not present

## 2017-01-06 DIAGNOSIS — N481 Balanitis: Secondary | ICD-10-CM | POA: Diagnosis not present

## 2017-01-06 DIAGNOSIS — N201 Calculus of ureter: Secondary | ICD-10-CM | POA: Diagnosis not present

## 2017-01-20 DIAGNOSIS — N481 Balanitis: Secondary | ICD-10-CM | POA: Diagnosis not present

## 2017-01-20 DIAGNOSIS — N201 Calculus of ureter: Secondary | ICD-10-CM | POA: Diagnosis not present

## 2017-01-21 ENCOUNTER — Encounter (HOSPITAL_COMMUNITY): Payer: Self-pay | Admitting: General Practice

## 2017-01-21 ENCOUNTER — Other Ambulatory Visit: Payer: Self-pay | Admitting: Urology

## 2017-01-24 ENCOUNTER — Ambulatory Visit (HOSPITAL_COMMUNITY): Payer: Medicare Other

## 2017-01-24 ENCOUNTER — Encounter (HOSPITAL_COMMUNITY): Payer: Self-pay | Admitting: General Practice

## 2017-01-24 ENCOUNTER — Encounter (HOSPITAL_COMMUNITY)
Admission: RE | Disposition: A | Discharge status: Home / Self Care | Payer: Self-pay | Source: Ambulatory Visit | Attending: Urology

## 2017-01-24 ENCOUNTER — Ambulatory Visit (HOSPITAL_COMMUNITY)
Admission: RE | Admit: 2017-01-24 | Discharge: 2017-01-24 | Disposition: A | Discharge status: Home / Self Care | Payer: Medicare Other | Source: Ambulatory Visit | Attending: Urology | Admitting: Urology

## 2017-01-24 DIAGNOSIS — Z79899 Other long term (current) drug therapy: Secondary | ICD-10-CM | POA: Insufficient documentation

## 2017-01-24 DIAGNOSIS — N201 Calculus of ureter: Secondary | ICD-10-CM | POA: Insufficient documentation

## 2017-01-24 DIAGNOSIS — I10 Essential (primary) hypertension: Secondary | ICD-10-CM | POA: Diagnosis not present

## 2017-01-24 DIAGNOSIS — E039 Hypothyroidism, unspecified: Secondary | ICD-10-CM | POA: Diagnosis not present

## 2017-01-24 DIAGNOSIS — E7800 Pure hypercholesterolemia, unspecified: Secondary | ICD-10-CM | POA: Diagnosis not present

## 2017-01-24 DIAGNOSIS — N200 Calculus of kidney: Secondary | ICD-10-CM | POA: Diagnosis present

## 2017-01-24 DIAGNOSIS — N481 Balanitis: Secondary | ICD-10-CM | POA: Diagnosis not present

## 2017-01-24 HISTORY — DX: Hypothyroidism, unspecified: E03.9

## 2017-01-24 HISTORY — PX: EXTRACORPOREAL SHOCK WAVE LITHOTRIPSY: SHX1557

## 2017-01-24 HISTORY — DX: Personal history of urinary calculi: Z87.442

## 2017-01-24 SURGERY — LITHOTRIPSY, ESWL
Anesthesia: LOCAL | Laterality: Right

## 2017-01-24 MED ORDER — DIPHENHYDRAMINE HCL 25 MG PO CAPS
25.0000 mg | ORAL_CAPSULE | ORAL | Status: AC
  Administered 2017-01-24: 25 mg via ORAL
  Filled 2017-01-24: qty 1

## 2017-01-24 MED ORDER — SODIUM CHLORIDE 0.9 % IV SOLN
INTRAVENOUS | Status: DC
  Administered 2017-01-24: 08:00:00 via INTRAVENOUS

## 2017-01-24 MED ORDER — CIPROFLOXACIN HCL 500 MG PO TABS
500.0000 mg | ORAL_TABLET | ORAL | Status: AC
  Administered 2017-01-24: 500 mg via ORAL
  Filled 2017-01-24: qty 1

## 2017-01-24 MED ORDER — DIAZEPAM 5 MG PO TABS
10.0000 mg | ORAL_TABLET | ORAL | Status: AC
  Administered 2017-01-24: 10 mg via ORAL
  Filled 2017-01-24: qty 2

## 2017-01-24 NOTE — H&P (Signed)
CC: I have kidney stones.  HPI: Jordan Evans is a 66 year-old male established patient who is here for renal calculi.  Jordan Evans is a former patient who I last saw in 07/23/09. He is sent back in consultation by for follow up of his history of stones and renal cysts. His last imaging was in 07-24-2011 with renal US and that showed a stable 51mm right mid renal stones, a 5.7cm right renal cyst and a 2cm left renal cyst. The previous study was a CT urogram in 07-23-09. He moved back to [place] in June and was having right flank pain but that has resolved. It was moderately severe. He has passed 2 stones since Jul 24, 2011. He had an [place] in January in [place] that showed 2 stones. He has had no hematuria. He has a 1 year history of increased frequency and urgency with a reduced stream. He has nocturia x 2. His PSA was 0.77 in January and was up 0.4 over the last 4 years and he had a negative DRE in January. He has had no UTI's. He has been having sharp pains in the penis that are worse after mowing the grass. He has urethral itching. He has a history of a urethral stricture and has been dilated several times. He last had a dilation in 23-Jul-2009. He had cystoscopy 2 years ago and there was no difficulty with scope passage. He has problems with phimosis and has difficulty with an erection.    January 20, 2017: He has a large right midureteral calculi noted on CT imaging performed a few weeks ago. He returns today for reassessment of any distal progression toward the bladder. 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 He was prescribed a steroid cream which has significantly reduced symptomology. No longer having any itching at the glans penis and now patient is able to fully retract foreskin without any issue. He denies dysuria or any other bothersome lower urinary tract symptoms.   There has not been any interval stone material passage and patient has  really only had one episode of acute renal colic which almost had been presented to the emergency department for pain management. He has been using ibuprofen intermittently. He is afebrile and tolerating oral intake without any problem.      The problem is on the right side. He first stated noticing pain on approximately 09/03/2016. This is not his first kidney stone. He has had 3 stones prior to getting this one. He is not currently having flank pain, back pain, groin pain, nausea, vomiting, fever or chills. He has not caught a stone in his urine strainer since his symptoms began.   He has had eswl for treatment of his stones in the past.     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 I  am have given him a script for clobetasol cream. - 01/06/2017 Renal calculus, The previously noted renal stone is now a 9 x7 mm right mid ureteral stone. It is 1200 HU and visible on the scout. There is no obstruction. I discussed observation, ESWL and URS and he would like to give it some more time. I will have him return in 2 weeks with a KUB and if the stone hasn't moved, he will need ESWL. I reviewed the risks in detail including bleeding, infection, injury adjacent structures, need for secondary procedures, cardiac arrhythmia, thrombotic events and sedation complications. - 01/06/2017, Nephrolithiasis, - 2014 Ureteral calculus, See above - 01/06/2017 Low back pain, Lumbago -  2014 Personal Hx Oth Urinary System diseases, History of urethral stricture - 2014 Renal cyst, Renal cyst, acquired - 2014 Testicular pain, unspecified, Testicular pain - 2014      PMH Notes:  1898-04-05 00:00:00 - Note: Normal Routine History And Physical Adult   NON-GU PMH: Personal history of other diseases of the circulatory system, History of hypertension - 2014 Personal history of other diseases of the digestive system, History of esophageal reflux - 2014 Personal history of other endocrine, nutritional and metabolic disease, History of hypercholesterolemia - 2014 GERD Hypercholesterolemia Hypertension Hypothyroidism    FAMILY HISTORY: Cancer - Brother Congenital heart failure - Father   SOCIAL HISTORY: Marital Status: Married Preferred Language: English; Race: White Current Smoking Status: Patient has never smoked.   Tobacco Use Assessment Completed: Used Tobacco in last 30 days? Drinks 3 drinks per week. Types of alcohol consumed: Beer.  Drinks 1 caffeinated drink per day.     Notes: Alcohol Use, Occupation:, Marital History - Currently Married, Tobacco Use, Caffeine Use   REVIEW OF SYSTEMS:    GU Review Male:   Patient reports get up at night to urinate and penile pain. Patient denies frequent urination, hard to postpone urination, burning/ pain with urination, leakage of urine, stream starts and stops, trouble starting your stream, have to strain to urinate , and erection problems.  Gastrointestinal (Upper):   Patient denies nausea, vomiting, and indigestion/ heartburn.  Gastrointestinal (Lower):   Patient denies diarrhea and constipation.  Constitutional:   Patient denies fatigue, night sweats, fever, and weight loss.  Skin:   Patient denies skin rash/ lesion and itching.  Eyes:   Patient denies blurred vision and double vision.  Ears/ Nose/ Throat:   Patient denies sore throat and sinus problems.  Hematologic/Lymphatic:   Patient denies swollen glands and easy  bruising.  Cardiovascular:   Patient denies leg swelling and chest pains.  Respiratory:   Patient denies cough and shortness of breath.  Endocrine:   Patient denies excessive thirst.  Musculoskeletal:   Patient reports back pain. Patient denies joint pain.  Neurological:   Patient denies headaches and dizziness.  Psychologic:   Patient denies depression and anxiety.   VITAL SIGNS:      01/20/2017 09:03 AM  BP 140/83 mmHg  Pulse 67 /min  Temperature 96.9 F / 36.0 C   GU PHYSICAL EXAMINATION:    Penis: Penis uncircumcised. No foreskin warts, no cracks. No dorsal peyronie's plaques, no left corporal peyronie's plaques, no right corporal peyronie's plaques, no scarring, no shaft warts. No balanitis, no meatal stenosis. Ventral adhesion has resolved. No phimosis. There is some discoloration on the glans that does suggest possible BXO. Balanitis has resolved.   MULTI-SYSTEM PHYSICAL EXAMINATION:    Constitutional: Well-nourished. No physical deformities. Normally developed. Good grooming.  Neck: Neck symmetrical, not  swollen. Normal tracheal position.  Respiratory: No labored breathing, no use of accessory muscles. CTA  Cardiovascular: Normal temperature, RRR without murmur  Lymphatic: No enlargement of neck, axillae, groin.  Skin: No paleness, no jaundice, no cyanosis. No lesion, no ulcer, no rash.  Neurologic / Psychiatric: Oriented to time, oriented to place, oriented to person. No depression, no anxiety, no agitation.  Gastrointestinal: Obese abdomen. Umbilical hernia scar with a small area of granulation. No mass, no tenderness, no rigidity. No CVAT. No flank or suprapubic tenderness.     PAST DATA REVIEWED:  Source Of History:  Patient  Records Review:   Previous Patient Records  Urine Test Review:   Urinalysis  X-Ray Review: KUB: Reviewed Films.  [facility] Protocol: Reviewed Films.     01/20/17  Urinalysis  Urine Appearance Clear   Urine Color Yellow   Urine Glucose Neg    Urine Bilirubin Neg   Urine Ketones Neg   Urine Specific Gravity 1.025   Urine Blood 1+   Urine pH 6.0   Urine Protein Neg   Urine Urobilinogen 0.2   Urine Nitrites Neg   Urine Leukocyte Esterase Neg   Urine WBC/hpf 0 - 5/hpf   Urine RBC/hpf 3 - 10/hpf   Urine Epithelial Cells NS (Not Seen)   Urine Bacteria NS (Not Seen)   Urine Mucous Present   Urine Yeast NS (Not Seen)   Urine Trichomonas Not Present   Urine Cystals NS (Not Seen)   Urine Casts NS (Not Seen)   Urine Sperm Not Present    PROCEDURES:         KUB - 09604  A single view of the abdomen is obtained.   Bony Abnormalities:  Stable appearing bilateral pelvic phlebolith identified.  Fecal Stasis:  Bowel gas pattern appears within normal limits.  Calculi:  Comparing the CT scout imaging to KUB today, there does appear to be a slight amount of distal progression of the right mid ureteral calculi and grossly measuring around 6 x 9 mm. It is adjacent to the I drew a narrow in PACS for further clarification. It is medial to the distal end of the right SI joint. No other obvious renal or ureteral calculi identified. The bladder appears free of obstruction.               Urinalysis w/Scope Dipstick Dipstick Cont'd Micro  Color: Yellow Bilirubin: Neg WBC/hpf: 0 - 5/hpf  Appearance: Clear Ketones: Neg RBC/hpf: 3 - 10/hpf  Specific Gravity: 1.025 Blood: 1+ Bacteria: NS (Not Seen)  pH: 6.0 Protein: Neg Cystals: NS (Not Seen)  Glucose: Neg Urobilinogen: 0.2 Casts: NS (Not Seen)    Nitrites: Neg Trichomonas: Not Present    Leukocyte Esterase: Neg Mucous: Present      Epithelial Cells: NS (Not Seen)      Yeast: NS (Not Seen)      Sperm: Not Present    ASSESSMENT:      ICD-10 Details  1 GU:   Ureteral calculus - N20.1 Right  2   Balanitis - N48.1 Improving   PLAN:            Medications New Meds: Hydrocodone-Acetaminophen 5 mg-325 mg tablet 1-2 tablet PO Q 6 H PRN   #20  0 Refill(s)            Orders Labs Urine  Culture          Schedule Return Visit/Planned Activity: 1 Week - Schedule Surgery  Document Letter(s):  Created for Patient: Clinical Summary   Plan:   -Right ESWL

## 2017-02-07 DIAGNOSIS — N201 Calculus of ureter: Secondary | ICD-10-CM | POA: Diagnosis not present

## 2017-02-07 DIAGNOSIS — Z23 Encounter for immunization: Secondary | ICD-10-CM | POA: Diagnosis not present

## 2017-02-21 ENCOUNTER — Telehealth: Payer: Self-pay | Admitting: Adult Health

## 2017-02-21 NOTE — Telephone Encounter (Signed)
Pt called requested Rx refill on Levothyroxine 50 MCG tablets --Takes 1 daily-- Pt states switched Pharmacy to [facility] on [address]--pls call if questions. --glh

## 2017-02-21 NOTE — Telephone Encounter (Signed)
Pt's spouse informed that pt has standing refills at his previous pharmacy and that they need to have the refills transferred to the new pharmacy.  Spouse expressed understanding and is agreeable.  Charyl Bigger, CMA

## 2017-03-09 ENCOUNTER — Emergency Department (HOSPITAL_COMMUNITY): Payer: Medicare Other

## 2017-03-09 ENCOUNTER — Other Ambulatory Visit: Payer: Self-pay

## 2017-03-09 ENCOUNTER — Encounter (HOSPITAL_COMMUNITY): Payer: Self-pay | Admitting: Emergency Medicine

## 2017-03-09 ENCOUNTER — Emergency Department (HOSPITAL_COMMUNITY)
Admission: EM | Admit: 2017-03-09 | Discharge: 2017-03-09 | Disposition: A | Discharge status: Home / Self Care | Payer: Medicare Other | Attending: Physician Assistant | Admitting: Physician Assistant

## 2017-03-09 DIAGNOSIS — R55 Syncope and collapse: Secondary | ICD-10-CM

## 2017-03-09 DIAGNOSIS — Z79899 Other long term (current) drug therapy: Secondary | ICD-10-CM | POA: Diagnosis not present

## 2017-03-09 DIAGNOSIS — T1490XA Injury, unspecified, initial encounter: Secondary | ICD-10-CM

## 2017-03-09 DIAGNOSIS — M25521 Pain in right elbow: Secondary | ICD-10-CM | POA: Diagnosis not present

## 2017-03-09 DIAGNOSIS — R404 Transient alteration of awareness: Secondary | ICD-10-CM | POA: Diagnosis not present

## 2017-03-09 DIAGNOSIS — S0003XA Contusion of scalp, initial encounter: Secondary | ICD-10-CM | POA: Diagnosis not present

## 2017-03-09 DIAGNOSIS — E785 Hyperlipidemia, unspecified: Secondary | ICD-10-CM | POA: Diagnosis not present

## 2017-03-09 DIAGNOSIS — I10 Essential (primary) hypertension: Secondary | ICD-10-CM | POA: Diagnosis not present

## 2017-03-09 DIAGNOSIS — E039 Hypothyroidism, unspecified: Secondary | ICD-10-CM | POA: Insufficient documentation

## 2017-03-09 DIAGNOSIS — Z23 Encounter for immunization: Secondary | ICD-10-CM | POA: Insufficient documentation

## 2017-03-09 DIAGNOSIS — R42 Dizziness and giddiness: Secondary | ICD-10-CM | POA: Diagnosis not present

## 2017-03-09 DIAGNOSIS — M7989 Other specified soft tissue disorders: Secondary | ICD-10-CM | POA: Diagnosis not present

## 2017-03-09 LAB — CBG MONITORING, ED: Glucose-Capillary: 97 mg/dL (ref 65–99)

## 2017-03-09 LAB — URINALYSIS, ROUTINE W REFLEX MICROSCOPIC
BILIRUBIN URINE: NEGATIVE
GLUCOSE, UA: NEGATIVE mg/dL
Hgb urine dipstick: NEGATIVE
KETONES UR: NEGATIVE mg/dL
Leukocytes, UA: NEGATIVE
NITRITE: NEGATIVE
PH: 7 (ref 5.0–8.0)
Protein, ur: NEGATIVE mg/dL
SPECIFIC GRAVITY, URINE: 1.013 (ref 1.005–1.030)

## 2017-03-09 LAB — I-STAT TROPONIN, ED
TROPONIN I, POC: 0.01 ng/mL (ref 0.00–0.08)
Troponin i, poc: 0 ng/mL (ref 0.00–0.08)

## 2017-03-09 LAB — CBC
HEMATOCRIT: 45.4 % (ref 39.0–52.0)
Hemoglobin: 15.4 g/dL (ref 13.0–17.0)
MCH: 29.2 pg (ref 26.0–34.0)
MCHC: 33.9 g/dL (ref 30.0–36.0)
MCV: 86 fL (ref 78.0–100.0)
Platelets: 248 10*3/uL (ref 150–400)
RBC: 5.28 MIL/uL (ref 4.22–5.81)
RDW: 13.1 % (ref 11.5–15.5)
WBC: 5.3 10*3/uL (ref 4.0–10.5)

## 2017-03-09 LAB — BASIC METABOLIC PANEL
Anion gap: 10 (ref 5–15)
BUN: 13 mg/dL (ref 6–20)
CO2: 25 mmol/L (ref 22–32)
Calcium: 8.5 mg/dL — ABNORMAL LOW (ref 8.9–10.3)
Chloride: 101 mmol/L (ref 101–111)
Creatinine, Ser: 0.99 mg/dL (ref 0.61–1.24)
GLUCOSE: 104 mg/dL — AB (ref 65–99)
POTASSIUM: 4.1 mmol/L (ref 3.5–5.1)
Sodium: 136 mmol/L (ref 135–145)

## 2017-03-09 MED ORDER — SODIUM CHLORIDE 0.9 % IV BOLUS (SEPSIS)
1000.0000 mL | Freq: Once | INTRAVENOUS | Status: AC
  Administered 2017-03-09: 1000 mL via INTRAVENOUS

## 2017-03-09 MED ORDER — IBUPROFEN 800 MG PO TABS
800.0000 mg | ORAL_TABLET | Freq: Once | ORAL | Status: AC
  Administered 2017-03-09: 800 mg via ORAL
  Filled 2017-03-09: qty 1

## 2017-03-09 MED ORDER — TETANUS-DIPHTH-ACELL PERTUSSIS 5-2.5-18.5 LF-MCG/0.5 IM SUSP
0.5000 mL | Freq: Once | INTRAMUSCULAR | Status: AC
  Administered 2017-03-09: 0.5 mL via INTRAMUSCULAR
  Filled 2017-03-09: qty 0.5

## 2017-03-09 MED ORDER — ACETAMINOPHEN 325 MG PO TABS
650.0000 mg | ORAL_TABLET | Freq: Once | ORAL | Status: AC
  Administered 2017-03-09: 650 mg via ORAL
  Filled 2017-03-09: qty 2

## 2017-03-09 NOTE — ED Notes (Signed)
ED Provider at bedside. 

## 2017-03-09 NOTE — ED Provider Notes (Signed)
[facility] EMERGENCY DEPARTMENT Provider Note   CSN: [id number] Arrival date & time: 03/09/17  0759     History   Chief Complaint Chief Complaint  Patient presents with  . Fall    HPI RYMAN RATHGEBER is a 66 y.o. male.  HPI   Patient is a 66 year old male presenting with syncope.  Patient had been feeling a little unsteady this morning.  He was going to warm up the car and was at the threshold of the front door.  He felt his vision go white and he felt lightheaded like his feet were touching the ground.  Then he woke up and his wife was standing above him.  Patient has hospital history significant for hypertension hyperlipidemia.  Patient has early cardiac deaths in family.  Patient had no chest pain today.  Patient does report bilateral jaw pain on the way in.  He is "unsure if it is just from gritting his teeth from stress".  Past Medical History:  Diagnosis Date  . History of kidney stones   . Hyperlipidemia   . Hypertension   . Hypothyroidism   . Thyroid disease     Patient Active Problem List   Diagnosis Date Noted  . Yeast infection 12/14/2016  . H/O multiple pulmonary nodules 12/14/2016  . Health care maintenance 08/31/2016  . Kidney calculi 08/31/2016  . Hernia of abdominal cavity 08/31/2016  . HTN (hypertension) 08/31/2016  . Hyperlipidemia 08/31/2016    Past Surgical History:  Procedure Laterality Date  . EXTRACORPOREAL SHOCK WAVE LITHOTRIPSY Right 01/24/2017   Procedure: EXTRACORPOREAL SHOCK WAVE LITHOTRIPSY (ESWL);  Surgeon: Nickie Retort, MD;  Location: [facility];  Service: Urology;  Laterality: Right;  . HERNIA REPAIR     umbilical hernia- 4315  . LITHOTRIPSY    . ROTATOR CUFF REPAIR Right   . URETHRAL DILATION     x5   . VASECTOMY         Home Medications    Prior to Admission medications   Medication Sig Start Date End Date Taking? Authorizing Provider  amLODipine (NORVASC) 5 MG tablet Take 1 tablet (5 mg total) by  mouth daily. 12/14/16  Yes Danford, Valetta Fuller D, NP  atorvastatin (LIPITOR) 20 MG tablet Take 1 tablet (20 mg total) by mouth daily. 09/21/16  Yes Danford, Valetta Fuller D, NP  levothyroxine (SYNTHROID, LEVOTHROID) 50 MCG tablet Take 1 tablet (50 mcg total) by mouth daily before breakfast. 09/21/16  Yes Danford, Berna Spare, NP    Family History Family History  Problem Relation Age of Onset  . Cancer Mother        ovarian  . Hyperlipidemia Mother   . Hypertension Mother   . Heart attack Father   . Hyperlipidemia Father   . Hypertension Father   . Hyperlipidemia Sister   . Hypertension Sister   . Cancer Brother        lymph  . Hyperlipidemia Brother   . Hypertension Brother   . Diabetes Paternal Grandmother   . Hyperlipidemia Brother   . Hypertension Brother   . Hyperlipidemia Brother   . Hypertension Brother   . Hyperlipidemia Brother   . Hypertension Brother     Social History Social History   Tobacco Use  . Smoking status: Never Smoker  . Smokeless tobacco: Never Used  Substance Use Topics  . Alcohol use: Yes    Comment: rarely  . Drug use: No     Allergies   Percocet [oxycodone-acetaminophen]   Review of  Systems Review of Systems  Constitutional: Negative for activity change.  Respiratory: Negative for shortness of breath.   Cardiovascular: Negative for chest pain.  Gastrointestinal: Negative for abdominal pain.  Neurological: Positive for seizures.  All other systems reviewed and are negative.    Physical Exam Updated Vital Signs BP (!) 134/91   Pulse 64   Resp 18   SpO2 99%   Physical Exam  Constitutional: He is oriented to person, place, and time. He appears well-nourished.  HENT:  Head: Normocephalic.  Eyes: Conjunctivae are normal.  Cardiovascular: Normal rate and regular rhythm.  No murmur heard. Pulmonary/Chest: Effort normal and breath sounds normal. No respiratory distress.  Musculoskeletal:  abraion to R elbow and right hand, normal ROM  Neurological:  He is oriented to person, place, and time.  Equal strength bilaterally upper and lower extremities negative pronator drift. Normal sensation bilaterally. Speech comprehensible, no slurring. Facial nerve tested and appears grossly normal. Alert and oriented 3.   Skin: Skin is warm and dry. He is not diaphoretic.  Abrasion to posterior head  Psychiatric: He has a normal mood and affect. His behavior is normal.     ED Treatments / Results  Labs (all labs ordered are listed, but only abnormal results are displayed) Labs Reviewed  BASIC METABOLIC PANEL - Abnormal; Notable for the following components:      Result Value   Glucose, Bld 104 (*)    Calcium 8.5 (*)    All other components within normal limits  URINALYSIS, ROUTINE W REFLEX MICROSCOPIC - Abnormal; Notable for the following components:   APPearance HAZY (*)    All other components within normal limits  CBC  CBG MONITORING, ED  I-STAT TROPONIN, ED  I-STAT TROPONIN, ED    EKG  EKG Interpretation  Date/Time:  Wednesday March 09 2017 08:17:47 EST Ventricular Rate:  63 PR Interval:    QRS Duration: 96 QT Interval:  402 QTC Calculation: 412 R Axis:   -22 Text Interpretation:  Sinus rhythm Borderline left axis deviation Normal sinus rhythm Confirmed by Thomasene Lot, [place] [phone]) on 03/09/2017 8:32:16 AM Also confirmed by Thomasene Lot, [place] [phone]), editor Philomena Doheny [phone])  on 03/09/2017 10:07:23 AM       Radiology Dg Chest 2 View  Result Date: 03/09/2017 CLINICAL DATA:  Syncope. EXAM: CHEST  2 VIEW COMPARISON:  None. FINDINGS: The heart size and mediastinal contours are within normal limits. Both lungs are clear. No pneumothorax or pleural effusion is noted. Elevated left hemidiaphragm is noted. The visualized skeletal structures are unremarkable. IMPRESSION: No active cardiopulmonary disease. Electronically Signed   By: Marijo Conception, M.D.   On: 03/09/2017 09:41   Dg Elbow Complete Right (3+view)  Result Date:  03/09/2017 CLINICAL DATA:  Syncopal episode while warming up car. Posterior elbow pain. EXAM: RIGHT ELBOW - COMPLETE 3+ VIEW COMPARISON:  None. FINDINGS: There is no evidence of fracture, dislocation, or joint effusion. Minimal lateral humeral epicondylar enthesopathy. There is no evidence of arthropathy or other focal bone abnormality. Intramedullary cyst versus projectional artifact proximal radius. Olecranon soft tissue swelling without subcutaneous gas or radiopaque foreign bodies. No joint effusion. IMPRESSION: Posterior elbow soft tissue swelling.  No acute osseous process. Electronically Signed   By: Elon Alas M.D.   On: 03/09/2017 14:24   Ct Head Wo Contrast  Result Date: 03/09/2017 CLINICAL DATA:  Head injury after syncope and fall today. EXAM: CT HEAD WITHOUT CONTRAST TECHNIQUE: Contiguous axial images were obtained from the base of the skull through  the vertex without intravenous contrast. COMPARISON:  None. 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 No mass effect or midline shift is noted. Ventricular size is within normal limits. There is no evidence of mass lesion, hemorrhage or acute infarction. Vascular: No hyperdense vessel or unexpected calcification. Skull: Normal. Negative for fracture or focal lesion. Sinuses/Orbits: Mild bilateral ethmoid sinusitis is noted. Other: Small right posterior parietal scalp hematoma is noted. IMPRESSION: Small right posterior scalp hematoma. No acute intracranial abnormality seen. Electronically Signed   By: Marijo Conception, M.D.   On: 03/09/2017 09:47    Procedures Procedures (including critical care time)  Medications Ordered in ED Medications  Tdap (BOOSTRIX) injection 0.5 mL (0.5 mLs Intramuscular Given 03/09/17 1001)  sodium chloride 0.9 % bolus 1,000 mL (0 mLs Intravenous Stopped 03/09/17 1347)  acetaminophen (TYLENOL) tablet 650 mg (650 mg Oral Given 03/09/17 1001)  ibuprofen (ADVIL,MOTRIN) tablet 800 mg  (800 mg Oral Given 03/09/17 1342)     Initial Impression / Assessment and Plan / ED Course  I have reviewed the triage vital signs and the nursing notes.  Pertinent labs & imaging results that were available during my care of the patient were reviewed by me and considered in my medical decision making (see chart for details).     Patient is a 66 year old male presenting with syncope.  Patient had been feeling a little unsteady this morning.  He was going to warm up the car and was at the threshold of the front door.  He felt his vision go white and he felt lightheaded like his feet were touching the ground.  Then he woke up and his wife was standing above him.  Patient has hospital history significant for hypertension hyperlipidemia.  Patient has early cardiac deaths in family.  Patient had no chest pain today.  Patient does report bilateral jaw pain on the way in.  He is "unsure if it is just from gritting his teeth from stress".  9:00 AM Will get CT, chest x-ray.  Will get troponin.  Will do delta troponin given that the jaw pain could be a chest pain equivalent.  However given that he had a prodrome, more likely to be vasovagal in nature rather than cardiac.  1:15 PM Patient had no cardiac events.  Appears in his normal state of health.  Wife is concerned about several events that are happening in the evening and nighttime when he gets up to the bathroom and then gets lost.  We will refer to neurology for dementia workup.  In addition will give information about Holter monitor for primary care.  3:08 PM [place] negative syncope rule.  Will discharge with follow up.   Steady ambulation in department. Normal vitals.   Final Clinical Impressions(s) / ED Diagnoses   Final diagnoses:  Vasovagal syncope    ED Discharge Orders    None       Macarthur Critchley, MD 03/09/17 [phone]

## 2017-03-09 NOTE — ED Triage Notes (Addendum)
Patient from home with [facility] after fall.  Patient states he felt unsteady when he woke but was able to ambulate around the house until he walked outside at which point he had an unwitnessed fall with LOC. Patient states he went outside and the next thing he remembers is waking up laying on the ground, then he crawled back into the house and shouted for help. Wife found patient on floor, states he was disoriented at the time. Patient alert and oriented at this time with hematoma to right posterior head with abrasion, bleeding controlled.  20g saline lock in left hand, received 4mg  zofran PTA for nausea. Nausea resolved, intermittent 3/10 headache and jaw pain. Patient in no apparent distress at this time.  Patient states he had a similar incident in the past that resolved after his antihypertensive medication dosages were adjusted.  EMS reports no orthostatic changes in patient at scene.

## 2017-03-09 NOTE — Discharge Instructions (Signed)
Please return with any concerns.  Please follow-up with your primary care for Holter monitor and potentially neurology for further workup.

## 2017-03-09 NOTE — ED Notes (Signed)
Pt CBG was 97, notified Clark(RN)

## 2017-03-09 NOTE — ED Notes (Signed)
Patient transported to X-ray 

## 2017-03-09 NOTE — ED Notes (Signed)
While ambulating pt's oxygen level remained the same.

## 2017-03-11 ENCOUNTER — Telehealth: Payer: Self-pay | Admitting: Adult Health

## 2017-03-11 ENCOUNTER — Encounter: Payer: Self-pay | Admitting: Adult Health

## 2017-03-11 NOTE — Telephone Encounter (Signed)
Patient's wife called into the office today to get a Holter monitor.  I explained that you would need to do a referral for this.  I also looked at his ED notes from 03/09/17 and it stated that he needed to follow-up with Neurology.  That has not been done either.  Can you do a referral for the Holter Monitor and for him to see Neurology.

## 2017-03-12 ENCOUNTER — Emergency Department (HOSPITAL_COMMUNITY): Payer: Medicare Other

## 2017-03-12 ENCOUNTER — Encounter (HOSPITAL_COMMUNITY): Payer: Self-pay

## 2017-03-12 ENCOUNTER — Emergency Department (HOSPITAL_COMMUNITY)
Admission: EM | Admit: 2017-03-12 | Discharge: 2017-03-12 | Disposition: A | Discharge status: Home / Self Care | Payer: Medicare Other | Attending: Emergency Medicine | Admitting: Emergency Medicine

## 2017-03-12 ENCOUNTER — Other Ambulatory Visit: Payer: Self-pay

## 2017-03-12 DIAGNOSIS — N200 Calculus of kidney: Secondary | ICD-10-CM | POA: Diagnosis not present

## 2017-03-12 DIAGNOSIS — N1330 Unspecified hydronephrosis: Secondary | ICD-10-CM | POA: Diagnosis not present

## 2017-03-12 DIAGNOSIS — I10 Essential (primary) hypertension: Secondary | ICD-10-CM | POA: Diagnosis not present

## 2017-03-12 DIAGNOSIS — E039 Hypothyroidism, unspecified: Secondary | ICD-10-CM | POA: Insufficient documentation

## 2017-03-12 DIAGNOSIS — Z79899 Other long term (current) drug therapy: Secondary | ICD-10-CM | POA: Insufficient documentation

## 2017-03-12 DIAGNOSIS — N132 Hydronephrosis with renal and ureteral calculous obstruction: Secondary | ICD-10-CM | POA: Diagnosis not present

## 2017-03-12 DIAGNOSIS — N210 Calculus in bladder: Secondary | ICD-10-CM | POA: Diagnosis not present

## 2017-03-12 LAB — URINALYSIS, ROUTINE W REFLEX MICROSCOPIC
BACTERIA UA: NONE SEEN
BILIRUBIN URINE: NEGATIVE
Glucose, UA: 50 mg/dL — AB
Ketones, ur: 20 mg/dL — AB
LEUKOCYTES UA: NEGATIVE
NITRITE: NEGATIVE
PH: 5 (ref 5.0–8.0)
Protein, ur: NEGATIVE mg/dL
SPECIFIC GRAVITY, URINE: 1.021 (ref 1.005–1.030)

## 2017-03-12 LAB — CBC
HEMATOCRIT: 44.9 % (ref 39.0–52.0)
HEMOGLOBIN: 15.3 g/dL (ref 13.0–17.0)
MCH: 29 pg (ref 26.0–34.0)
MCHC: 34.1 g/dL (ref 30.0–36.0)
MCV: 85 fL (ref 78.0–100.0)
Platelets: 236 10*3/uL (ref 150–400)
RBC: 5.28 MIL/uL (ref 4.22–5.81)
RDW: 12.9 % (ref 11.5–15.5)
WBC: 10 10*3/uL (ref 4.0–10.5)

## 2017-03-12 LAB — COMPREHENSIVE METABOLIC PANEL
ALT: 17 U/L (ref 17–63)
ANION GAP: 7 (ref 5–15)
AST: 24 U/L (ref 15–41)
Albumin: 3.9 g/dL (ref 3.5–5.0)
Alkaline Phosphatase: 112 U/L (ref 38–126)
BILIRUBIN TOTAL: 0.7 mg/dL (ref 0.3–1.2)
BUN: 15 mg/dL (ref 6–20)
CO2: 24 mmol/L (ref 22–32)
Calcium: 8.5 mg/dL — ABNORMAL LOW (ref 8.9–10.3)
Chloride: 103 mmol/L (ref 101–111)
Creatinine, Ser: 1.06 mg/dL (ref 0.61–1.24)
Glucose, Bld: 130 mg/dL — ABNORMAL HIGH (ref 65–99)
POTASSIUM: 4.1 mmol/L (ref 3.5–5.1)
Sodium: 134 mmol/L — ABNORMAL LOW (ref 135–145)
TOTAL PROTEIN: 7.4 g/dL (ref 6.5–8.1)

## 2017-03-12 LAB — LIPASE, BLOOD: Lipase: 24 U/L (ref 11–51)

## 2017-03-12 MED ORDER — ACETAMINOPHEN-CODEINE #3 300-30 MG PO TABS: 1.0000 | ORAL_TABLET | Freq: Four times a day (QID) | ORAL | 0 refills | Status: AC | PRN

## 2017-03-12 MED ORDER — IBUPROFEN 400 MG PO TABS
600.0000 mg | ORAL_TABLET | Freq: Once | ORAL | Status: AC
  Administered 2017-03-12: 12:00:00 600 mg via ORAL
  Filled 2017-03-12: qty 1

## 2017-03-12 MED ORDER — ONDANSETRON HCL 4 MG/2ML IJ SOLN: 4.0000 mg | Freq: Once | INTRAMUSCULAR | Status: DC

## 2017-03-12 MED ORDER — SODIUM CHLORIDE 0.9 % IV BOLUS (SEPSIS): 1000.0000 mL | Freq: Once | INTRAVENOUS | Status: DC

## 2017-03-12 NOTE — Discharge Instructions (Addendum)
Thank you for allowing [place] to provide your care.   Please follow-up with your urologist as soon as possible.

## 2017-03-12 NOTE — ED Provider Notes (Signed)
[facility] EMERGENCY DEPARTMENT Provider Note   CSN: [id number] Arrival date & time: 03/12/17  5176  History   Chief Complaint Chief Complaint  Patient presents with  . constipation/right flank pain   HPI Jordan Evans is a 66 y.o. male with a history of nephrolithiasis who presented to the ED with right flank pain radiating to the right scrotum that began acutely this AM. He states he awoke from sleep at 3 am with stabbing suprapubic abdominal pain that radiated to his testicles and right flank. It was associated with nausea and non-bloody emesis. Initially he thought he may be constipated and his wife gave him miralax and did some bowel exercises. He subsequently had several large bowel movements without improvement in his pain. Pain is worse with urination. He is unsure if this pain is related to nephrolithiasis as it is much worse compared to his prior episodes. He still has his appendix and gallbladder. 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 He denies fevers, chills, hematuria, polyuria, diarrhea, radiation of the pain to his back, worsening pain with movement, weight loss, bloody bowel movements.   His PMHx is significant for recurrent nephrolithiasis. With his most recent episode on 10/22 when he was found to have a right calculi measuring 6 x 9 mm. He subsequently underwent lithotripsy. He follows with Dr. Jodene Nam at [facility]. They are unsure why he gets recurrent kidney stones.   He was also evaluated on 12/5 for a syncopal episode. He is schedule to follow-up with a neurologist and cardiologist as an outpatient. The episode was felt to be secondary to vasovagal as he had prodromal symptoms including visual changes and LE weakness/numbness.   Past Medical History:  Diagnosis Date  . History of kidney stones   .  Hyperlipidemia   . Hypertension   . Hypothyroidism   . Thyroid disease    Patient Active Problem List   Diagnosis Date Noted  . Yeast infection 12/14/2016  . H/O multiple pulmonary nodules 12/14/2016  . Health care maintenance 08/31/2016  . Kidney calculi 08/31/2016  . Hernia of abdominal cavity 08/31/2016  . HTN (hypertension) 08/31/2016  . Hyperlipidemia 08/31/2016   Past Surgical History:  Procedure Laterality Date  . EXTRACORPOREAL SHOCK WAVE LITHOTRIPSY Right 01/24/2017   Procedure: EXTRACORPOREAL SHOCK WAVE LITHOTRIPSY (ESWL);  Surgeon: Nickie Retort, MD;  Location: [facility];  Service: Urology;  Laterality: Right;  . HERNIA REPAIR     umbilical hernia- 1607  . LITHOTRIPSY    . ROTATOR CUFF REPAIR Right   . URETHRAL DILATION     x5   . VASECTOMY      Home Medications    Prior to Admission medications   Medication Sig Start Date End Date Taking? Authorizing Provider  acetaminophen-codeine (TYLENOL #3) 300-30 MG tablet Take 1-2 tablets by mouth every 6 (six) hours as needed for up to 3 days for moderate pain. 03/12/17 03/15/17  Ina Homes, MD  amLODipine (NORVASC) 5 MG tablet Take 1 tablet (5 mg total) by mouth daily. 12/14/16   Danford, Valetta Fuller D, NP  atorvastatin (LIPITOR) 20 MG tablet Take 1 tablet (20 mg total) by mouth daily. 09/21/16   Danford, Valetta Fuller D, NP  levothyroxine (SYNTHROID, LEVOTHROID) 50 MCG tablet Take 1 tablet (50 mcg total) by mouth daily before breakfast. 09/21/16   Danford, Berna Spare, NP   Family History Family  History  Problem Relation Age of Onset  . Cancer Mother        ovarian  . Hyperlipidemia Mother   . Hypertension Mother   . Heart attack Father   . Hyperlipidemia Father   . Hypertension Father   . Hyperlipidemia Sister   . Hypertension Sister   . Cancer Brother        lymph  . Hyperlipidemia Brother   . Hypertension Brother   . Diabetes Paternal Grandmother   . Hyperlipidemia Brother   . Hypertension Brother   . Hyperlipidemia  Brother   . Hypertension Brother   . Hyperlipidemia Brother   . Hypertension Brother    Social History Social History   Tobacco Use  . Smoking status: Never Smoker  . Smokeless tobacco: Never Used  Substance Use Topics  . Alcohol use: Yes    Comment: rarely  . Drug use: No   Allergies   Percocet [oxycodone-acetaminophen]  Review of Systems  All systems reviewed and are negative for acute change except as noted in the HPI.  Physical Exam Updated Vital Signs BP 140/83 (BP Location: Left Arm)   Pulse 77   Temp 98.4 F (36.9 C) (Oral)   Resp 16   Ht 5\' 10"  (1.778 m)   Wt 90.7 kg (200 lb)   SpO2 99%   BMI 28.70 kg/m   General: Well nourished male in no acute distress HENT: Abrasion to the right occipital region with overlying scab, moist mucus membranes  Pulm: Good air movement with no wheezing or crackles  CV: RRR, no murmurs, no rubs  Abdomen: Active bowel sounds, soft, non-distended, no fluid wave, ventral hernia noted, no tenderness to palpation, no peritoneal signs  Extremities: Trace LE edema, radial pulses palpable bilaterally  Skin: No new rashes or lesions noted  Neuro: Alert and oriented x 3  ED Treatments / Results  Labs (all labs ordered are listed, but only abnormal results are displayed) Labs Reviewed  COMPREHENSIVE METABOLIC PANEL - Abnormal; Notable for the following components:      Result Value   Sodium 134 (*)    Glucose, Bld 130 (*)    Calcium 8.5 (*)    All other components within normal limits  URINALYSIS, ROUTINE W REFLEX MICROSCOPIC - Abnormal; Notable for the following components:   Glucose, UA 50 (*)    Hgb urine dipstick MODERATE (*)    Ketones, ur 20 (*)    Squamous Epithelial / LPF 0-5 (*)    All other components within normal limits  LIPASE, BLOOD  CBC   EKG  EKG Interpretation None      Radiology Ct Renal Stone Study  Result Date: 03/12/2017 CLINICAL DATA:  65 year old male with flank pain, recurrent stone disease  suspected. EXAM: CT ABDOMEN AND PELVIS WITHOUT CONTRAST TECHNIQUE: Multidetector CT imaging of the abdomen and pelvis was performed following the standard protocol without IV contrast. COMPARISON:  01/06/2017 FINDINGS: Lower chest: No acute abnormality. Hepatobiliary: Stable 8 mm hypodensity in segment 2. No other focal liver abnormality is seen. No gallstones, gallbladder wall thickening, or biliary dilatation. Pancreas: Unremarkable. No pancreatic ductal dilatation or surrounding inflammatory changes. Spleen: Normal in size without focal abnormality. Adrenals/Urinary Tract: Adrenal glands are unremarkable. A multilobulated cystic lesion with faint central calcifications along internal septation is again noted at the right upper pole. It is stable from most recent comparison study, measuring 8 x 6 cm. A 2 cm hypodensity along the lower left renal hilum is also grossly unchanged. There  is mild right-sided hydronephrosis and hydroureter, new from the prior study. There is mild associated stranding and wall thickening. Two 4 and 6 mm stones are dependently located within the bladder. The the appearance is most likely related to recent passage of these stones. No other renal or ureteral calculi are noted. There is no hydronephrosis on the left. Stomach/Bowel: Stomach is within normal limits. Appendix appears normal. No evidence of bowel wall thickening, distention, or inflammatory changes. Vascular/Lymphatic: Aortic atherosclerosis. No enlarged abdominal or pelvic lymph nodes. Reproductive: Prostate is unremarkable. Other: No abdominal wall hernia or abnormality. No abdominopelvic ascites. Musculoskeletal: Stable degenerative changes are noted of the lower lumbar spine. No acute osseous abnormality. IMPRESSION: 1. Two 4 and 6 mm stones dependently located within the bladder. There is mild right-sided hydronephrosis and hydroureter with associated stranding and wall thickening. The appearance is most likely related to  recent passage of the stones. No other renal or ureteral calculi are noted. 2. Multi-cystic mass at the upper pole of the right kidney, stable compared to most recent study from October of 2018. Again, Bosniak classification cannot be definitively determined due to lack of IV contrast, but this is at least a category 2 cyst. 3. Stable hypodensity along the lower left renal hilum, likely representing a parapelvic cyst. 4. Stable hypodensity within segment 2 of the liver, too small to definitively characterize but likely representing a cyst. 5. Aortic atherosclerosis. 6. Other stable chronic findings as detailed. Electronically Signed   By: Kristopher Oppenheim M.D.   On: 03/12/2017 10:47    Procedures Procedures (including critical care time)  Medications Ordered in ED Medications  ondansetron (ZOFRAN) injection 4 mg (4 mg Intravenous Refused 03/12/17 1133)  sodium chloride 0.9 % bolus 1,000 mL (not administered)  ibuprofen (ADVIL,MOTRIN) tablet 600 mg (600 mg Oral Given 03/12/17 1132)   Initial Impression / Assessment and Plan / ED Course  I have reviewed the triage vital signs and the nursing notes.  Pertinent labs & imaging results that were available during my care of the patient were reviewed by me and considered in my medical decision making (see chart for details).    Signs and symptoms consistent with nephrolithiasis. UA showing microscopic hematuria. Order CT renal stone that illustrated 2 stones within the bladder (13mm and 30mm) with mild right-sided hydronephrosis and hydroureter with associated stranding and wall thickening. The appearance is most likely related to recent passage of the stones. Discussed the case with his urologist Dr. Pilar Jarvis who would like to follow-up with the patient as an outpatient.   Discussed the plan with the patient and his family. They agree with the plan. We will discharge with Tylenol #3 and he will follow-up with Urology. He was discharged in stable condition.    Final Clinical Impressions(s) / ED Diagnoses   Final diagnoses:  Nephrolithiasis   ED Discharge Orders        Ordered    acetaminophen-codeine (TYLENOL #3) 300-30 MG tablet  Every 6 hours PRN     03/12/17 1146       Ina Homes, MD 03/12/17 1209    Drenda Freeze, MD 03/13/17 [phone]

## 2017-03-12 NOTE — ED Notes (Signed)
Got patient into a gown on the monitor  

## 2017-03-12 NOTE — ED Triage Notes (Signed)
Patient complains of recurrent right flank pain with radiation to scrotum with nausea and vomiting since am. Had lithotripsy 6 weeks ago for kidney stone on the right. Also constipation since Monday but states that his normal for him. Alert and oriented

## 2017-03-14 ENCOUNTER — Encounter: Payer: Self-pay | Admitting: Adult Health

## 2017-03-14 ENCOUNTER — Other Ambulatory Visit: Payer: Self-pay | Admitting: Adult Health

## 2017-03-14 DIAGNOSIS — R55 Syncope and collapse: Secondary | ICD-10-CM

## 2017-03-14 NOTE — Telephone Encounter (Signed)
Good Morning Wendy, I placed both orders. Thanks for the detailed message! Sincerely, Valetta Fuller

## 2017-03-15 ENCOUNTER — Encounter: Payer: BLUE CROSS/BLUE SHIELD | Admitting: Adult Health

## 2017-03-16 NOTE — Progress Notes (Signed)
Subjective:    Patient ID: Jordan Evans, male    DOB: [birth year], 66 y.o.   MRN: [id number]  HPI: 12/14/16 OV: Jordan Evans is here for f/u: HTN. He has been experiencing daily/brief dizziness (lasts for <18min), associated with position change, has been occurring >6 months.  He denies syncope or falls. Home BP readings: SBP 90-120, DBP 60-70 He has been on Amlodipine 10mg  >10 years. He denies CP/dyspnea/palpitations. He denies tobacco or excessive EOTH use. He has 2 acute issues: 1) Ventral hernia repair 1996, revision to remove protruding mesh in 2000.  12 months ago, one small/painful/darkened/hard area developed over umbilicus. 2) Itching with "white stuff" around tip of of penis that would worsen after sex with his wife.  This became so uncomfortable that they stopped having sex in April.  He denies swollen inguinal lymph nodes or hematuria.  He has existing appt with urology 01/05/17.  03/17/17 OV: Jordan Evans is here for ED f/u: vasovagal syncope episode.  We decreased Amlodipine from 10mg  to 5mg  Sept 2018 due to dizziness and he reports that sx's  resolved.  Then 03/09/17 he had syncopal episode- LOC 1-70mins?, we reviewed all imaging/labs/notes from encounter.  CT HEAD WO- 03/09/17 TECHNIQUE: Contiguous axial images were obtained from the base of the skull through the vertex without intravenous contrast.  COMPARISON:  None.  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 No mass effect or midline shift is noted. Ventricular size is within normal limits. There is no evidence of mass lesion, hemorrhage or acute infarction.  Vascular: No hyperdense vessel or unexpected calcification.  Skull: Normal. Negative for fracture or focal lesion.  Sinuses/Orbits: Mild bilateral ethmoid sinusitis is noted.  Other: Small right posterior parietal scalp hematoma is noted.  IMPRESSION: Small right posterior scalp hematoma. No acute  intracranial abnormality seen.  He denies HA/visions change/CP/palpitatoins. He continues to have intermittent dizziness that occurs everyday and happens more often with position changes, but can occur evenwhen sitting still and when lying in bed, vertigo perhaps? He denies any recent LOC, but did have a fall 03/12/17 r/t severe abdominal pain due to kidney stone that resulted in anither ED visit. He denies first degree family hx of any neurological disorders. 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 His wife is at [employer] [facility] [employer], who is a Marine scientist and she is very concerned and even tearful at times.    Referral to Neurology placed at ED, however he has not received a call to make an appt.   Patient Care Team    Relationship Specialty Notifications Start End  Esaw Grandchild, NP PCP - General Family Medicine  08/31/16     Patient Active Problem List   Diagnosis Date Noted  . Dizziness 03/17/2017  . Vasovagal syncope 03/17/2017  . Yeast infection 12/14/2016  . H/O multiple pulmonary nodules 12/14/2016  . Health care maintenance 08/31/2016  . Kidney calculi 08/31/2016  . Hernia of abdominal cavity 08/31/2016  . HTN (hypertension) 08/31/2016  . Hyperlipidemia 08/31/2016     Past Medical History:  Diagnosis Date  . History of kidney stones   . Hyperlipidemia   . Hypertension   . Hypothyroidism   . Syncope   . Thyroid disease      Past Surgical History:  Procedure Laterality Date  . EXTRACORPOREAL SHOCK WAVE LITHOTRIPSY Right 01/24/2017   Procedure: EXTRACORPOREAL SHOCK WAVE LITHOTRIPSY (ESWL);  Surgeon:  Nickie Retort, MD;  Location: [facility];  Service: Urology;  Laterality: Right;  . HERNIA REPAIR     umbilical hernia- 1610  . LITHOTRIPSY    . ROTATOR CUFF REPAIR Right   . URETHRAL DILATION     x5   . VASECTOMY       Family History  Problem Relation Age of Onset  . Cancer Mother        ovarian  .  Hyperlipidemia Mother   . Hypertension Mother   . Heart attack Father   . Hyperlipidemia Father   . Hypertension Father   . Hyperlipidemia Sister   . Hypertension Sister   . Cancer Brother        lymph  . Hyperlipidemia Brother   . Hypertension Brother   . Diabetes Paternal Grandmother   . Hyperlipidemia Brother   . Hypertension Brother   . Hyperlipidemia Brother   . Hypertension Brother   . Hyperlipidemia Brother   . Hypertension Brother      Social History   Substance and Sexual Activity  Drug Use No     Social History   Substance and Sexual Activity  Alcohol Use Yes   Comment: rarely     Social History   Tobacco Use  Smoking Status Never Smoker  Smokeless Tobacco Never Used     Outpatient Encounter Medications as of 03/17/2017  Medication Sig  . amLODipine (NORVASC) 5 MG tablet Take 1 tablet (5 mg total) by mouth daily.  Marland Kitchen atorvastatin (LIPITOR) 20 MG tablet Take 1 tablet (20 mg total) by mouth daily.  Marland Kitchen levothyroxine (SYNTHROID, LEVOTHROID) 50 MCG tablet Take 1 tablet (50 mcg total) by mouth daily before breakfast.  . tamsulosin (FLOMAX) 0.4 MG CAPS capsule Take 0.4 mg by mouth as needed.  . triamcinolone ointment (KENALOG) 0.1 % Apply 1 application topically as needed.   No facility-administered encounter medications on file as of 03/17/2017.     Allergies: Percocet [oxycodone-acetaminophen]  Body mass index is 29.43 kg/m.  Blood pressure 112/67, pulse 72, height 5\' 9"  (1.753 m), weight 199 lb 4.8 oz (90.4 kg).    Review of Systems  Constitutional: Positive for fatigue. Negative for activity change, appetite change, chills, diaphoresis, fever and unexpected weight change.  HENT: Negative for congestion.   Eyes: Negative for visual disturbance.  Respiratory: Negative for cough, chest tightness, shortness of breath, wheezing and stridor.   Cardiovascular: Negative for chest pain, palpitations and leg swelling.  Gastrointestinal: Negative for  abdominal distention, abdominal pain, blood in stool, constipation, diarrhea, nausea and vomiting.  Genitourinary: Negative for decreased urine volume, difficulty urinating, flank pain, penile swelling, testicular pain and urgency.  Skin: Negative for color change, pallor, rash and wound.  Neurological: Positive for dizziness. Negative for tremors, seizures, syncope, speech difficulty, weakness and headaches.  Hematological: Does not bruise/bleed easily.  Psychiatric/Behavioral: Negative for sleep disturbance.       Objective:   Physical Exam  Constitutional: He is oriented to person, place, and time. He appears well-developed and well-nourished. No distress.  HENT:  Head: Normocephalic and atraumatic.  Right Ear: External ear normal.  Left Ear: External ear normal.  Eyes: Conjunctivae are normal. Pupils are equal, round, and reactive to light.  Neck: Neck supple.  Cardiovascular: Normal rate, regular rhythm, normal heart sounds and intact distal pulses.  No murmur heard. Pulmonary/Chest: Effort normal and breath sounds normal. He has no wheezes. He has no rales. He exhibits no tenderness.  Lymphadenopathy:    He has  no cervical adenopathy.  Neurological: He is alert and oriented to person, place, and time. He has normal reflexes. He displays no atrophy and no tremor. No cranial nerve deficit. He exhibits normal muscle tone. He displays no seizure activity. Coordination and gait normal.  Skin: Skin is warm and dry. No rash noted. He is not diaphoretic. No erythema. No pallor.  Psychiatric: He has a normal mood and affect. His behavior is normal. Judgment and thought content normal.          Assessment & Plan:   1. Dizziness   2. Vasovagal syncope   3. Health care maintenance   4. Kidney calculi   5. Yeast infection     Vasovagal syncope Please mention all of your concerns that we discussed to your Neurologist at your appt on 03/21/17 at 0900. Increase water intake and  continue to be cautious with position changes. Please call with any questions/concerns.  Health care maintenance Follow-up in 3 months  Kidney calculi Acute attach 03/12/17 Underwent lithotripsy with Urology this week  Yeast infection Recently Dx'd with BXO and under care of Urologist  Spent > 45 mins with pt and his wife reviewing all encounters from ED - 12/5, 12/8 and facilitating Neurology f/u- now scheduled for 03/21/17  FOLLOW-UP:  Return in about 3 months (around 06/15/2017) for Regular Follow Up.

## 2017-03-17 ENCOUNTER — Encounter: Payer: Self-pay | Admitting: Adult Health

## 2017-03-17 ENCOUNTER — Ambulatory Visit (INDEPENDENT_AMBULATORY_CARE_PROVIDER_SITE_OTHER): Payer: Medicare Other | Admitting: Adult Health

## 2017-03-17 VITALS — BP 112/67 | HR 72 | Ht 69.0 in | Wt 199.3 lb

## 2017-03-17 DIAGNOSIS — Z0000 Encounter for general adult medical examination without abnormal findings: Secondary | ICD-10-CM

## 2017-03-17 DIAGNOSIS — R55 Syncope and collapse: Secondary | ICD-10-CM | POA: Diagnosis not present

## 2017-03-17 DIAGNOSIS — N201 Calculus of ureter: Secondary | ICD-10-CM | POA: Diagnosis not present

## 2017-03-17 DIAGNOSIS — R42 Dizziness and giddiness: Secondary | ICD-10-CM | POA: Diagnosis not present

## 2017-03-17 DIAGNOSIS — N200 Calculus of kidney: Secondary | ICD-10-CM | POA: Diagnosis not present

## 2017-03-17 DIAGNOSIS — B379 Candidiasis, unspecified: Secondary | ICD-10-CM | POA: Diagnosis not present

## 2017-03-17 NOTE — Assessment & Plan Note (Signed)
Recently Dx'd with BXO and under care of Urologist

## 2017-03-17 NOTE — Patient Instructions (Addendum)
Dizziness Dizziness is a common problem. It is a feeling of unsteadiness or light-headedness. You may feel like you are about to faint. Dizziness can lead to injury if you stumble or fall. Anyone can become dizzy, but dizziness is more common in older adults. This condition can be caused by a number of things, including medicines, dehydration, or illness. Follow these instructions at home: Taking these steps may help with your condition: Eating and drinking  Drink enough fluid to keep your urine clear or pale yellow. This helps to keep you from becoming dehydrated. Try to drink more clear fluids, such as water.  Do not drink alcohol.  Limit your caffeine intake if directed by your health care provider.  Limit your salt intake if directed by your health care provider. Activity  Avoid making quick movements. ? Rise slowly from chairs and steady yourself until you feel okay. ? In the morning, first sit up on the side of the bed. When you feel okay, stand slowly while you hold onto something until you know that your balance is fine.  Move your legs often if you need to stand in one place for a long time. Tighten and relax your muscles in your legs while you are standing.  Do not drive or operate heavy machinery if you feel dizzy.  Avoid bending down if you feel dizzy. Place items in your home so that they are easy for you to reach without leaning over. Lifestyle  Do not use any tobacco products, including cigarettes, chewing tobacco, or electronic cigarettes. If you need help quitting, ask your health care provider.  Try to reduce your stress level, such as with yoga or meditation. Talk with your health care provider if you need help. General instructions  Watch your dizziness for any changes.  Take medicines only as directed by your health care provider. Talk with your health care provider if you think that your dizziness is caused by a medicine that you are taking.  Tell a friend or  a family member that you are feeling dizzy. If he or she notices any changes in your behavior, have this person call your health care provider.  Keep all follow-up visits as directed by your health care provider. This is important. Contact a health care provider if:  Your dizziness does not go away.  Your dizziness or light-headedness gets worse.  You feel nauseous.  You have reduced hearing.  You have new symptoms.  You are unsteady on your feet or you feel like the room is spinning. Get help right away if:  You vomit or have diarrhea and are unable to eat or drink anything.  You have problems talking, walking, swallowing, or using your arms, hands, or legs.  You feel generally weak.  You are not thinking clearly or you have trouble forming sentences. It may take a friend or family member to notice this.  You have chest pain, abdominal pain, shortness of breath, or sweating.  Your vision changes.  You notice any bleeding.  You have a headache.  You have neck pain or a stiff neck.  You have a fever. This information is not intended to replace advice given to you by your health care provider. Make sure you discuss any questions you have with your health care provider. Document Released: 09/15/2000 Document Revised: 08/28/2015 Document Reviewed: 03/18/2014 Elsevier Interactive Patient Education  2017 [employer].  Please mention all of your concerns that we discussed to your Neurologist at your appt on 03/21/17  at 0900. Increase water intake and continue to be cautious with position changes. Please call with any questions/concerns. Please follow-up in 3 months.

## 2017-03-17 NOTE — Assessment & Plan Note (Signed)
Please mention all of your concerns that we discussed to your Neurologist at your appt on 03/21/17 at 0900. Increase water intake and continue to be cautious with position changes. Please call with any questions/concerns.

## 2017-03-17 NOTE — Assessment & Plan Note (Signed)
Acute attach 03/12/17 Underwent lithotripsy with Urology this week

## 2017-03-17 NOTE — Assessment & Plan Note (Signed)
Follow up in 3 months

## 2017-03-21 ENCOUNTER — Encounter: Payer: Self-pay | Admitting: Diagnostic Neuroimaging

## 2017-03-21 ENCOUNTER — Ambulatory Visit (INDEPENDENT_AMBULATORY_CARE_PROVIDER_SITE_OTHER): Payer: Medicare Other | Admitting: Diagnostic Neuroimaging

## 2017-03-21 VITALS — BP 131/78 | HR 78 | Ht 70.0 in | Wt 201.8 lb

## 2017-03-21 DIAGNOSIS — R413 Other amnesia: Secondary | ICD-10-CM | POA: Diagnosis not present

## 2017-03-21 DIAGNOSIS — G450 Vertebro-basilar artery syndrome: Secondary | ICD-10-CM | POA: Diagnosis not present

## 2017-03-21 DIAGNOSIS — G459 Transient cerebral ischemic attack, unspecified: Secondary | ICD-10-CM

## 2017-03-21 DIAGNOSIS — H8143 Vertigo of central origin, bilateral: Secondary | ICD-10-CM

## 2017-03-21 DIAGNOSIS — IMO0001 Reserved for inherently not codable concepts without codable children: Secondary | ICD-10-CM

## 2017-03-21 NOTE — Patient Instructions (Signed)
RECURRENT SYNCOPE (unprovoked?) - check MRI brain, MRA head / neck  - follow up with cardiology and cardiac monitor testing  - According to [place] law, you can not drive unless you are syncope free for at least 6 months and under physician's care.   - Please maintain precautions. Do not participate in activities where a loss of awareness could harm you or someone else. No swimming alone, no tub bathing, no hot tubs, no driving, no operating motorized vehicles (cars, ATVs, motocycles, etc), lawnmowers, power tools or firearms. No standing at heights, such as rooftops, ladders or stairs. Avoid hot objects such as stoves, heaters, open fires. Wear a helmet when riding a bicycle, scooter, skateboard, etc. and avoid areas of traffic. Set your water heater to 120 degrees or less.   MEMORY LOSS - return for MMSE and brain check testing in 1-2 weeks (setup with nurse Tyler Aas)

## 2017-03-21 NOTE — Progress Notes (Signed)
[facility]  PATIENT: Jordan Evans DOB: Mar 07, 1951  REFERRING CLINICIAN: Danford HISTORY FROM: patient  REASON FOR VISIT: new consult    HISTORICAL  CHIEF COMPLAINT:  Chief Complaint  Patient presents with  . Vasovagal syncope    rm 7, New Pt, wife- Jordan Evans, "was at home, lower legs felt numb, walked out door, everything went white, next thing I know I'm on the ground; hx of dizziness, often with movement, nauseated, room spinning; other episodes of passing out"    HISTORY OF PRESENT ILLNESS:   66 year old right-handed male here for evaluation of syncope.  03/09/17 patient was at home, getting ready to leave home when he felt abnormal sensation from his knees down, legs felt funny and then he passed out.  He was passed off for approximately 1 minute.  Patient went to the emergency room for evaluation.  Patient's wife mentioned that she was concerned that patient was acting differently, getting up in the middle the night to go to the bathroom and then getting lost.  They recommended neurology follow-up for dementia evaluation.  03/12/17 patient was at home, felt spinning, nausea, vision changes, slurred speech, kidney and flank pain and passed out for a few seconds.  Patient went to the emergency room for evaluation and was found to have kidney stones.  He was set up for outpatient urology follow-up.  Also with at least one year of gradual onset progressive short-term memory loss and confusion spells according to wife.   REVIEW OF SYSTEMS: Full 14 system review of systems performed and negative with exception of: Memory loss confusion headache dizziness passing out ringing in ears spinning sensation.  ALLERGIES: Allergies  Allergen Reactions  . Percocet [Oxycodone-Acetaminophen]     Leg weakness    HOME MEDICATIONS: Outpatient Medications Prior to Visit  Medication Sig Dispense Refill  . amLODipine (NORVASC) 5 MG tablet Take 1 tablet (5 mg total) by  mouth daily. 90 tablet 0  . atorvastatin (LIPITOR) 20 MG tablet Take 1 tablet (20 mg total) by mouth daily. 90 tablet 2  . levothyroxine (SYNTHROID, LEVOTHROID) 50 MCG tablet Take 1 tablet (50 mcg total) by mouth daily before breakfast. 90 tablet 2  . tamsulosin (FLOMAX) 0.4 MG CAPS capsule Take 0.4 mg by mouth as needed.  99  . triamcinolone ointment (KENALOG) 0.1 % Apply 1 application topically as needed.  1   No facility-administered medications prior to visit.     PAST MEDICAL HISTORY: Past Medical History:  Diagnosis Date  . History of kidney stones   . Hyperlipidemia   . Hypertension   . Hypothyroidism   . Syncope   . Thyroid disease     PAST SURGICAL HISTORY: Past Surgical History:  Procedure Laterality Date  . EXTRACORPOREAL SHOCK WAVE LITHOTRIPSY Right 01/24/2017   Procedure: EXTRACORPOREAL SHOCK WAVE LITHOTRIPSY (ESWL);  Surgeon: Nickie Retort, MD;  Location: [facility];  Service: Urology;  Laterality: Right;  . HERNIA REPAIR     umbilical hernia- [id number]  . LITHOTRIPSY    . ROTATOR CUFF REPAIR Right   . URETHRAL DILATION     x5   . VASECTOMY      FAMILY HISTORY: Family History  Problem Relation Age of Onset  . Cancer Mother        ovarian  . Hyperlipidemia Mother   . Hypertension Mother   . Heart attack Father   . Hyperlipidemia Father   . Hypertension Father   . Hyperlipidemia Sister   . Hypertension Sister   .  Cancer Brother        lymph  . Hyperlipidemia Brother   . Hypertension Brother   . Diabetes Paternal Grandmother   . Hyperlipidemia Brother   . Hypertension Brother   . Hyperlipidemia Brother   . Hypertension Brother   . Hyperlipidemia Brother   . Hypertension Brother     SOCIAL HISTORY:  Social History   Socioeconomic History  . Marital status: Married    Spouse name: Not on file  . Number of children: Not on file  . Years of education: Not on file  . Highest education level: Not on file  Social Needs  . Financial resource  strain: Not on file  . Food insecurity - worry: Not on file  . Food insecurity - inability: Not on file  . Transportation needs - medical: Not on file  . Transportation needs - non-medical: Not on file  Occupational History  . Not on file  Tobacco Use  . Smoking status: Never Smoker  . Smokeless tobacco: Never Used  Substance and Sexual Activity  . Alcohol use: Yes    Comment: rarely  . Drug use: No  . Sexual activity: Yes    Birth control/protection: Surgical  Other Topics Concern  . Not on file  Social History Narrative   Lives with wife   [employer] college, works for [employer]   Children 5   Caffeine- 1 soda daily     PHYSICAL EXAM  GENERAL EXAM/CONSTITUTIONAL: Vitals:  Vitals:   03/21/17 0919  BP: 131/78  Pulse: 78  Weight: 201 lb 12.8 oz (91.5 kg)  Height: 5\' 10"  (1.778 m)     Body mass index is 28.96 kg/m.  Visual Acuity Screening   Right eye Left eye Both eyes  Without correction:     With correction: 20/30 20/30      Patient is in no distress; well developed, nourished and groomed; neck is supple  CARDIOVASCULAR:  Examination of carotid arteries is normal; no carotid bruits  Regular rate and rhythm, no murmurs  Examination of peripheral vascular system by observation and palpation is normal  EYES:  Ophthalmoscopic exam of optic discs and posterior segments is normal; no papilledema or hemorrhages  MUSCULOSKELETAL:  Gait, strength, tone, movements noted in Neurologic exam below  NEUROLOGIC: MENTAL STATUS:  No flowsheet data found.  awake, alert, oriented to person, place and time  recent and remote memory intact  normal attention and concentration  language fluent, comprehension intact, naming intact,   fund of knowledge appropriate  CRANIAL NERVE:   2nd - no papilledema on fundoscopic exam  2nd, 3rd, 4th, 6th - pupils equal and reactive to light, visual fields full to confrontation, extraocular muscles intact, no  nystagmus  5th - facial sensation symmetric  7th - facial strength symmetric  8th - hearing intact  9th - palate elevates symmetrically, uvula midline  11th - shoulder shrug symmetric  12th - tongue protrusion midline  MOTOR:   normal bulk and tone, full strength in the BUE, BLE  SENSORY:   normal and symmetric to light touch, temperature, vibration  COORDINATION:   finger-nose-finger, fine finger movements normal  REFLEXES:   deep tendon reflexes present and symmetric  GAIT/STATION:   narrow based gait; able to walk tandem; romberg is negative    DIAGNOSTIC DATA (LABS, IMAGING, TESTING) - I reviewed patient records, labs, notes, testing and imaging myself where available.  Lab Results  Component Value Date   WBC 10.0 03/12/2017   HGB 15.3  03/12/2017   HCT 44.9 03/12/2017   MCV 85.0 03/12/2017   PLT 236 03/12/2017      Component Value Date/Time   NA 134 (L) 03/12/2017 0904   NA 140 01/14/2016   NA 140 04/18/2015   K 4.1 03/12/2017 0904   K 4.5 04/18/2015   CL 103 03/12/2017 0904   CO2 24 03/12/2017 0904   GLUCOSE 130 (H) 03/12/2017 0904   BUN 15 03/12/2017 0904   BUN 14 01/14/2016   CREATININE 1.06 03/12/2017 0904   CREATININE 0.95 04/18/2015   CALCIUM 8.5 (L) 03/12/2017 0904   PROT 7.4 03/12/2017 0904   ALBUMIN 3.9 03/12/2017 0904   AST 24 03/12/2017 0904   AST 23 04/18/2015   ALT 17 03/12/2017 0904   ALT 19 04/18/2015   ALKPHOS 112 03/12/2017 0904   ALKPHOS 95 04/18/2015   BILITOT 0.7 03/12/2017 0904   GFRNONAA >60 03/12/2017 0904   GFRAA >60 03/12/2017 0904   Lab Results  Component Value Date   CHOL 183 01/14/2016   HDL 63 01/14/2016   LDLCALC 99 01/14/2016   TRIG 107 01/14/2016   No results found for: HGBA1C No results found for: VITAMINB12 Lab Results  Component Value Date   TSH 2.54 01/14/2016    03/09/17 CT head [I reviewed images myself and agree with interpretation. -VRP]  - Small right posterior scalp hematoma. No acute  intracranial abnormality seen.     ASSESSMENT AND PLAN  66 y.o. year old male here with recurrent, unprovoked syncope.    Ddx: syncope (vasovagal vs cardiogenic vs vertebro-basilar insufficiency)  Ddx memory loss: anxiety, metabolic, neurodegenerative   No diagnosis found.   PLAN:  RECURRENT SYNCOPE (unprovoked?) - check MRI brain, MRA head / neck  - follow up with cardiology and cardiac monitor testing  - According to [place] law, you can not drive unless you are syncope free for at least 6 months and under physician's care.   - Please maintain precautions. Do not participate in activities where a loss of awareness could harm you or someone else. No swimming alone, no tub bathing, no hot tubs, no driving, no operating motorized vehicles (cars, ATVs, motocycles, etc), lawnmowers, power tools or firearms. No standing at heights, such as rooftops, ladders or stairs. Avoid hot objects such as stoves, heaters, open fires. Wear a helmet when riding a bicycle, scooter, skateboard, etc. and avoid areas of traffic. Set your water heater to 120 degrees or less.   MEMORY LOSS - return for MMSE and brain check testing in 1-2 weeks   Orders Placed This Encounter  Procedures  . MR BRAIN W WO CONTRAST  . MR MRA HEAD WO CONTRAST  . MR MRA NECK W WO CONTRAST   Return in about 3 months (around 06/19/2017).    Penni Bombard, MD 16/96/7893, 8:10 AM Certified in Neurology, Neurophysiology and Neuroimaging  [facility] [address] [place], [place] [address] [phone]

## 2017-03-24 ENCOUNTER — Other Ambulatory Visit: Payer: Self-pay | Admitting: Adult Health

## 2017-03-24 DIAGNOSIS — R42 Dizziness and giddiness: Secondary | ICD-10-CM

## 2017-03-24 DIAGNOSIS — R55 Syncope and collapse: Secondary | ICD-10-CM

## 2017-03-24 NOTE — Progress Notes (Signed)
Holter Monitor/Referral to cards placed

## 2017-03-28 ENCOUNTER — Ambulatory Visit (INDEPENDENT_AMBULATORY_CARE_PROVIDER_SITE_OTHER): Payer: Medicare Other

## 2017-03-28 DIAGNOSIS — R55 Syncope and collapse: Secondary | ICD-10-CM

## 2017-04-06 ENCOUNTER — Ambulatory Visit
Admission: RE | Admit: 2017-04-06 | Discharge: 2017-04-06 | Disposition: A | Discharge status: Home / Self Care | Payer: Medicare Other | Source: Ambulatory Visit | Attending: Diagnostic Neuroimaging | Admitting: Diagnostic Neuroimaging

## 2017-04-06 ENCOUNTER — Telehealth: Payer: Self-pay | Admitting: Adult Health

## 2017-04-06 ENCOUNTER — Other Ambulatory Visit: Payer: Self-pay | Admitting: Adult Health

## 2017-04-06 DIAGNOSIS — G459 Transient cerebral ischemic attack, unspecified: Secondary | ICD-10-CM

## 2017-04-06 DIAGNOSIS — IMO0001 Reserved for inherently not codable concepts without codable children: Secondary | ICD-10-CM

## 2017-04-06 DIAGNOSIS — R413 Other amnesia: Secondary | ICD-10-CM

## 2017-04-06 DIAGNOSIS — G450 Vertebro-basilar artery syndrome: Secondary | ICD-10-CM

## 2017-04-06 MED ORDER — ALPRAZOLAM 0.5 MG PO TABS: 0.5000 mg | ORAL_TABLET | Freq: Two times a day (BID) | ORAL | 0 refills | Status: DC | PRN

## 2017-04-06 NOTE — Telephone Encounter (Signed)
Jordan Evans, Can you please call Msr. Jordan Evans and share that I sent in Alprazolam 0.5mg  Q12H PRN anxiety. FYI... Notes recorded by Esaw Grandchild, NP on 04/04/2017 at 5:06 PM EST Good Jordan Evans, Can you please call Jordan Evans and share that his Holter monitor results showed SR with rare PACs, PVCs, NO sustained arrhythmias. Please ask how he is feeling and if he needs anything. Thanks! Jordan Evans

## 2017-04-06 NOTE — Telephone Encounter (Signed)
LVM for pt's spouse to call to discuss.  T. Nelson, CMA ? ?

## 2017-04-06 NOTE — Telephone Encounter (Signed)
Please advise of appropriateness of medication request.  Charyl Bigger, CMA

## 2017-04-06 NOTE — Telephone Encounter (Signed)
Patient had MRI sch for today but was unable to complete because of anxiety in the machine, would like to be prescribed something for this. Also, they would like to discuss results of heart monitor too. Please call wife Anne Ng @ [phone]

## 2017-04-07 NOTE — Telephone Encounter (Signed)
Pt informed of results.  Pt expressed understanding.  Pt states that he is continuing to have dizziness with nausea almost every day.  Per Mina Marble, NP pt needs to call his neurologist regarding the dizziness.  Pt informed, expressed understanding and is agreeable.  Charyl Bigger, CMA

## 2017-04-08 ENCOUNTER — Telehealth: Payer: Self-pay | Admitting: Adult Health

## 2017-04-08 NOTE — Telephone Encounter (Signed)
Advised pt's spouse that all referrals and treatment for dizziness need to be addressed by neurology since they are the providers treating him for this condition.  Spouse expressed understanding.  Charyl Bigger, CMA

## 2017-04-08 NOTE — Telephone Encounter (Signed)
Pt's wife called to request provider refer patient for physical therapy due to his continued dizziness--forwarding msg to  Medical assistant/ provider. --glh

## 2017-04-15 ENCOUNTER — Ambulatory Visit: Admission: RE | Admit: 2017-04-15 | Payer: Medicare Other | Source: Ambulatory Visit

## 2017-06-14 NOTE — Progress Notes (Signed)
Subjective:    Patient ID: Jordan Evans, male    DOB: 1950/12/20, 67 y.o.   MRN: [id number]  HPI: 12/14/16 OV: Jordan Evans is here for f/u: HTN. He has been experiencing daily/brief dizziness (lasts for <30min), associated with position change, has been occurring >6 months.  He denies syncope or falls. Home BP readings: SBP 90-120, DBP 60-70 He has been on Amlodipine 10mg  >10 years. He denies CP/dyspnea/palpitations. He denies tobacco or excessive EOTH use. He has 2 acute issues: 1) Ventral hernia repair 1996, revision to remove protruding mesh in 2000.  12 months ago, one small/painful/darkened/hard area developed over umbilicus. 2) Itching with "white stuff" around tip of of penis that would worsen after sex with his wife.  This became so uncomfortable that they stopped having sex in April.  He denies swollen inguinal lymph nodes or hematuria.  He has existing appt with urology 01/05/17.  03/17/17 OV: Jordan Evans is here for ED f/u: vasovagal syncope episode.  We decreased Amlodipine from 10mg  to 5mg  Sept 2018 due to dizziness and he reports that sx's  resolved.  Then 03/09/17 he had syncopal episode- LOC 1-25mins?, we reviewed all imaging/labs/notes from encounter.  CT HEAD WO- 03/09/17 TECHNIQUE: Contiguous axial images were obtained from the base of the skull through the vertex without intravenous contrast.  COMPARISON:  None.  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 No mass effect or midline shift is noted. Ventricular size is within normal limits. There is no evidence of mass lesion, hemorrhage or acute infarction.  Vascular: No hyperdense vessel or unexpected calcification.  Skull: Normal. Negative for fracture or focal lesion.  Sinuses/Orbits: Mild bilateral ethmoid sinusitis is noted.  Other: Small right posterior parietal scalp hematoma is noted.  IMPRESSION: Small right posterior scalp hematoma. No acute  intracranial abnormality seen.  He denies HA/visions change/CP/palpitatoins. He continues to have intermittent dizziness that occurs everyday and happens more often with position changes, but can occur evenwhen sitting still and when lying in bed, vertigo perhaps? He denies any recent LOC, but did have a fall 03/12/17 r/t severe abdominal pain due to kidney stone that resulted in anither ED visit. He denies first degree family hx of any neurological disorders. 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 His wife is at [employer] [employer], who is a Marine scientist and she is very concerned and even tearful at times.    Referral to Neurology placed at ED, however he has not received a call to make an appt.  06/16/17 OV: Jordan Evans presents for f/u: He was seen by Neuro 03/21/18- recommended MRI brain, MRA head / neck, cardiac monitor testing (Holter Results- Sinus rhythm Rare premature atrial contractions and premature ventricular contractions) No sustained arrhythmias, f/u with cards, and  return for MMSE and brain check testing in 1-2 weeks MRI/MRA was not completed due to severe claustrophobia. He denies HA/dizzines/change in vision/memory difficulties/CP/dyspnea/palpitations. He denies tobacco use and rarely uses ETOH. He tries to follow heart healthy diet, but does indulge in chips/sweets most days of week He has started walking the dog several days/week- GREAT! Overall, he feels "pretty great" Wife at [employer]  Patient Care Team    Relationship Specialty Notifications Start End  Esaw Grandchild, NP PCP - General Family Medicine  08/31/16     Patient Active Problem List   Diagnosis Date Noted  . Screening for AAA (abdominal aortic  aneurysm) 06/16/2017  . Hypothyroidism 06/16/2017  . Dizziness 03/17/2017  . Vasovagal syncope 03/17/2017  . Yeast infection 12/14/2016  . H/O multiple pulmonary nodules 12/14/2016  . Health care maintenance  08/31/2016  . Kidney calculi 08/31/2016  . Hernia of abdominal cavity 08/31/2016  . HTN (hypertension) 08/31/2016  . Hyperlipidemia 08/31/2016     Past Medical History:  Diagnosis Date  . History of kidney stones   . Hyperlipidemia   . Hypertension   . Hypothyroidism   . Syncope   . Thyroid disease      Past Surgical History:  Procedure Laterality Date  . EXTRACORPOREAL SHOCK WAVE LITHOTRIPSY Right 01/24/2017   Procedure: EXTRACORPOREAL SHOCK WAVE LITHOTRIPSY (ESWL);  Surgeon: Nickie Retort, MD;  Location: [facility];  Service: Urology;  Laterality: Right;  . HERNIA REPAIR     umbilical hernia- [phone]  . LITHOTRIPSY    . ROTATOR CUFF REPAIR Right   . URETHRAL DILATION     x5   . VASECTOMY       Family History  Problem Relation Age of Onset  . Cancer Mother        ovarian  . Hyperlipidemia Mother   . Hypertension Mother   . Heart attack Father   . Hyperlipidemia Father   . Hypertension Father   . Hyperlipidemia Sister   . Hypertension Sister   . Cancer Brother        lymph  . Hyperlipidemia Brother   . Hypertension Brother   . Diabetes Paternal Grandmother   . Hyperlipidemia Brother   . Hypertension Brother   . Hyperlipidemia Brother   . Hypertension Brother   . Hyperlipidemia Brother   . Hypertension Brother      Social History   Substance and Sexual Activity  Drug Use No     Social History   Substance and Sexual Activity  Alcohol Use Yes   Comment: rarely     Social History   Tobacco Use  Smoking Status Never Smoker  Smokeless Tobacco Never Used     Outpatient Encounter Medications as of 06/16/2017  Medication Sig  . amLODipine (NORVASC) 5 MG tablet Take 1 tablet (5 mg total) by mouth daily.  Marland Kitchen atorvastatin (LIPITOR) 20 MG tablet Take 1 tablet (20 mg total) by mouth daily.  Marland Kitchen levothyroxine (SYNTHROID, LEVOTHROID) 50 MCG tablet Take 1 tablet (50 mcg total) by mouth daily before breakfast.  . triamcinolone ointment (KENALOG) 0.1 %  Apply 1 application topically as needed.  . [DISCONTINUED] ALPRAZolam (XANAX) 0.5 MG tablet Take 1 tablet (0.5 mg total) by mouth 2 (two) times daily as needed for anxiety.  . [DISCONTINUED] tamsulosin (FLOMAX) 0.4 MG CAPS capsule Take 0.4 mg by mouth as needed.   No facility-administered encounter medications on file as of 06/16/2017.     Allergies: Percocet [oxycodone-acetaminophen]  Body mass index is 29.31 kg/m.  Blood pressure 117/72, pulse 60, height 5\' 10"  (1.778 m), weight 204 lb 4.8 oz (92.7 kg), SpO2 98 %.    Review of Systems  Constitutional: Positive for fatigue. Negative for activity change, appetite change, chills, diaphoresis, fever and unexpected weight change.  HENT: Negative for congestion.   Eyes: Negative for visual disturbance.  Respiratory: Negative for cough, chest tightness, shortness of breath, wheezing and stridor.   Cardiovascular: Negative for chest pain, palpitations and leg swelling.  Gastrointestinal: Negative for abdominal distention, abdominal pain, blood in stool, constipation, diarrhea, nausea and vomiting.  Genitourinary: Negative for decreased urine volume, difficulty urinating, flank pain, penile  swelling, testicular pain and urgency.  Skin: Negative for color change, pallor, rash and wound.  Neurological: Negative for dizziness, tremors, seizures, syncope, speech difficulty, weakness and headaches.  Hematological: Does not bruise/bleed easily.  Psychiatric/Behavioral: Negative for sleep disturbance.       Objective:   Physical Exam  Constitutional: He is oriented to person, place, and time. He appears well-developed and well-nourished. No distress.  HENT:  Head: Normocephalic and atraumatic.  Right Ear: External ear normal.  Left Ear: External ear normal.  Eyes: Conjunctivae are normal. Pupils are equal, round, and reactive to light.  Neck: Neck supple.  Cardiovascular: Normal rate, regular rhythm, normal heart sounds and intact distal  pulses.  No murmur heard. Pulmonary/Chest: Effort normal and breath sounds normal. He has no wheezes. He has no rales. He exhibits no tenderness.  Lymphadenopathy:    He has no cervical adenopathy.  Neurological: He is alert and oriented to person, place, and time. He has normal reflexes. He displays no atrophy and no tremor. No cranial nerve deficit. He exhibits normal muscle tone. He displays no seizure activity. Coordination and gait normal.  Skin: Skin is warm and dry. No rash noted. He is not diaphoretic. No erythema. No pallor.  Psychiatric: He has a normal mood and affect. His behavior is normal. Judgment and thought content normal.          Assessment & Plan:   1. Screening for AAA (abdominal aortic aneurysm)   2. Hypertension, unspecified type   3. Hyperlipidemia, unspecified hyperlipidemia type   4. Dizziness   5. Health care maintenance   6. Kidney calculi   7. Hypothyroidism, unspecified type     Health care maintenance Continue all medications as directed. Follow-up with Neurology and Urology as directed. Follow Heart Healthy diet Increase regular exercise.  Recommend at least 30 minutes daily, 5 days per week of walking, jogging, biking, swimming, YouTube/Pinterest workout videos. Recommend Welcome to [employer] Visit and Complete physical this summer- separate office visits.  Dizziness Has not experienced dizziness in months Unable to completed MRI/MRA due to claustrophobia Has f/u with Neuro May 2019 Advised to call Neuro is dizziness develops  Kidney calculi Follow up with Urology  Hypothyroidism TSH drawn today Currently on Levothyroxine 50 mcg/day  HTN (hypertension) BP at goal 117/72, HR 60 Continue Amlodipine 5mg  QD Reviewed recent Holter results again  Hyperlipidemia Fasting labs obtained today Currently on Atorvastatin 20mg  QD, denies myaglia's  Pt was in the office today for 25+ minutes, with over 50% time spent in face to face  counseling of patient's various medical conditions and in coordination of care FOLLOW-UP:  Return in about 3 months (around 09/16/2017) for Medical Wellness.

## 2017-06-16 ENCOUNTER — Ambulatory Visit (INDEPENDENT_AMBULATORY_CARE_PROVIDER_SITE_OTHER): Payer: Medicare Other | Admitting: Adult Health

## 2017-06-16 ENCOUNTER — Encounter: Payer: Self-pay | Admitting: Adult Health

## 2017-06-16 VITALS — BP 117/72 | HR 60 | Ht 70.0 in | Wt 204.3 lb

## 2017-06-16 DIAGNOSIS — N200 Calculus of kidney: Secondary | ICD-10-CM

## 2017-06-16 DIAGNOSIS — Z136 Encounter for screening for cardiovascular disorders: Secondary | ICD-10-CM | POA: Diagnosis not present

## 2017-06-16 DIAGNOSIS — Z0000 Encounter for general adult medical examination without abnormal findings: Secondary | ICD-10-CM | POA: Diagnosis not present

## 2017-06-16 DIAGNOSIS — E039 Hypothyroidism, unspecified: Secondary | ICD-10-CM

## 2017-06-16 DIAGNOSIS — R42 Dizziness and giddiness: Secondary | ICD-10-CM | POA: Diagnosis not present

## 2017-06-16 DIAGNOSIS — I10 Essential (primary) hypertension: Secondary | ICD-10-CM

## 2017-06-16 DIAGNOSIS — E785 Hyperlipidemia, unspecified: Secondary | ICD-10-CM

## 2017-06-16 NOTE — Assessment & Plan Note (Signed)
BP at goal 117/72, HR 60 Continue Amlodipine 5mg  QD Reviewed recent Holter results again

## 2017-06-16 NOTE — Addendum Note (Signed)
Addended by: Fonnie Mu on: 06/16/2017 09:08 AM   Modules accepted: Orders

## 2017-06-16 NOTE — Assessment & Plan Note (Signed)
TSH drawn today Currently on Levothyroxine 50 mcg/day

## 2017-06-16 NOTE — Patient Instructions (Addendum)
Heart-Healthy Eating Plan Heart-healthy meal planning includes:  Limiting unhealthy fats.  Increasing healthy fats.  Making other small dietary changes.  You may need to talk with your doctor or a diet specialist (dietitian) to create an eating plan that is right for you. What types of fat should I choose?  Choose healthy fats. These include olive oil and canola oil, flaxseeds, walnuts, almonds, and seeds.  Eat more omega-3 fats. These include salmon, mackerel, sardines, tuna, flaxseed oil, and ground flaxseeds. Try to eat fish at least twice each week.  Limit saturated fats. ? Saturated fats are often found in animal products, such as meats, butter, and cream. ? Plant sources of saturated fats include palm oil, palm kernel oil, and coconut oil.  Avoid foods with partially hydrogenated oils in them. These include stick margarine, some tub margarines, cookies, crackers, and other baked goods. These contain trans fats. What general guidelines do I need to follow?  Check food labels carefully. Identify foods with trans fats or high amounts of saturated fat.  Fill one half of your plate with vegetables and green salads. Eat 4-5 servings of vegetables per day. A serving of vegetables is: ? 1 cup of raw leafy vegetables. ?  cup of raw or cooked cut-up vegetables. ?  cup of vegetable juice.  Fill one fourth of your plate with whole grains. Look for the word "whole" as the first word in the ingredient list.  Fill one fourth of your plate with lean protein foods.  Eat 4-5 servings of fruit per day. A serving of fruit is: ? One medium whole fruit. ?  cup of dried fruit. ?  cup of fresh, frozen, or canned fruit. ?  cup of 100% fruit juice.  Eat more foods that contain soluble fiber. These include apples, broccoli, carrots, beans, peas, and barley. Try to get 20-30 g of fiber per day.  Eat more home-cooked food. Eat less restaurant, buffet, and fast food.  Limit or avoid  alcohol.  Limit foods high in starch and sugar.  Avoid fried foods.  Avoid frying your food. Try baking, boiling, grilling, or broiling it instead. You can also reduce fat by: ? Removing the skin from poultry. ? Removing all visible fats from meats. ? Skimming the fat off of stews, soups, and gravies before serving them. ? Steaming vegetables in water or broth.  Lose weight if you are overweight.  Eat 4-5 servings of nuts, legumes, and seeds per week: ? One serving of dried beans or legumes equals  cup after being cooked. ? One serving of nuts equals 1 ounces. ? One serving of seeds equals  ounce or one tablespoon.  You may need to keep track of how much salt or sodium you eat. This is especially true if you have high blood pressure. Talk with your doctor or dietitian to get more information. What foods can I eat? Grains Breads, including French, white, pita, wheat, raisin, rye, oatmeal, and Italian. Tortillas that are neither fried nor made with lard or trans fat. Low-fat rolls, including hotdog and hamburger buns and English muffins. Biscuits. Muffins. Waffles. Pancakes. Light popcorn. Whole-grain cereals. Flatbread. Melba toast. Pretzels. Breadsticks. Rusks. Low-fat snacks. Low-fat crackers, including oyster, saltine, matzo, graham, animal, and rye. Rice and pasta, including brown rice and pastas that are made with whole wheat. Vegetables All vegetables. Fruits All fruits, but limit coconut. Meats and Other Protein Sources Lean, well-trimmed beef, veal, pork, and lamb. Chicken and turkey without skin. All fish and shellfish.   Wild duck, rabbit, pheasant, and venison. Egg whites or low-cholesterol egg substitutes. Dried beans, peas, lentils, and tofu. Seeds and most nuts. Dairy Low-fat or nonfat cheeses, including ricotta, string, and mozzarella. Skim or 1% milk that is liquid, powdered, or evaporated. Buttermilk that is made with low-fat milk. Nonfat or low-fat  yogurt. Beverages Mineral water. Diet carbonated beverages. Sweets and Desserts Sherbets and fruit ices. Honey, jam, marmalade, jelly, and syrups. Meringues and gelatins. Pure sugar candy, such as hard candy, jelly beans, gumdrops, mints, marshmallows, and small amounts of dark chocolate. [employer] Eat all sweets and desserts in moderation. Fats and Oils Nonhydrogenated (trans-free) margarines. Vegetable oils, including soybean, sesame, sunflower, olive, peanut, safflower, corn, canola, and cottonseed. Salad dressings or mayonnaise made with a vegetable oil. Limit added fats and oils that you use for cooking, baking, salads, and as spreads. Other Cocoa powder. Coffee and tea. All seasonings and condiments. The items listed above may not be a complete list of recommended foods or beverages. Contact your dietitian for more options. What foods are not recommended? Grains Breads that are made with saturated or trans fats, oils, or whole milk. Croissants. Butter rolls. Cheese breads. Sweet rolls. Donuts. Buttered popcorn. Chow mein noodles. High-fat crackers, such as cheese or butter crackers. Meats and Other Protein Sources Fatty meats, such as hotdogs, short ribs, sausage, spareribs, bacon, rib eye roast or steak, and mutton. High-fat deli meats, such as salami and bologna. Caviar. Domestic duck and goose. Organ meats, such as kidney, liver, sweetbreads, and heart. Dairy Cream, sour cream, cream cheese, and creamed cottage cheese. Whole-milk cheeses, including blue (bleu), [place], [place], [place], American, [place], Swiss, cheddar, [place], and [place]. Whole or 2% milk that is liquid, evaporated, or condensed. Whole buttermilk. Cream sauce or high-fat cheese sauce. Yogurt that is made from whole milk. Beverages Regular sodas and juice drinks with added sugar. Sweets and Desserts Frosting. Pudding. Cookies. Cakes other than angel food cake. Candy that has milk chocolate or white  chocolate, hydrogenated fat, butter, coconut, or unknown ingredients. Buttered syrups. Full-fat ice cream or ice cream drinks. Fats and Oils Gravy that has suet, meat fat, or shortening. Cocoa butter, hydrogenated oils, palm oil, coconut oil, palm kernel oil. These can often be found in baked products, candy, fried foods, nondairy creamers, and whipped toppings. Solid fats and shortenings, including bacon fat, salt pork, lard, and butter. Nondairy cream substitutes, such as coffee creamers and sour cream substitutes. Salad dressings that are made of unknown oils, cheese, or sour cream. The items listed above may not be a complete list of foods and beverages to avoid. Contact your dietitian for more information. This information is not intended to replace advice given to you by your health care provider. Make sure you discuss any questions you have with your health care provider. Document Released: 09/21/2011 Document Revised: 08/28/2015 Document Reviewed: 09/13/2013 Elsevier Interactive Patient Education  2018 [employer].   Exercising to [employer] Exercising can help you to lose weight. In order to lose weight through exercise, you need to do vigorous-intensity exercise. You can tell that you are exercising with vigorous intensity if you are breathing very hard and fast and cannot hold a conversation while exercising. Moderate-intensity exercise helps to maintain your current weight. You can tell that you are exercising at a moderate level if you have a higher heart rate and faster breathing, but you are still able to hold a conversation. How often should I exercise? Choose an activity that you enjoy  and set realistic goals. Your health care provider can help you to make an activity plan that works for you. Exercise regularly as directed by your health care provider. This may include:  Doing resistance training twice each week, such as: ? Push-ups. ? Sit-ups. ? Lifting weights. ? Using  resistance bands.  Doing a given intensity of exercise for a given amount of time. Choose from these options: ? 150 minutes of moderate-intensity exercise every week. ? 75 minutes of vigorous-intensity exercise every week. ? A mix of moderate-intensity and vigorous-intensity exercise every week.  Children, pregnant women, people who are out of shape, people who are overweight, and older adults may need to consult a health care provider for individual recommendations. If you have any sort of medical condition, be sure to consult your health care provider before starting a new exercise program. What are some activities that can help me to lose weight?  Walking at a rate of at least 4.5 miles an hour.  Jogging or running at a rate of 5 miles per hour.  Biking at a rate of at least 10 miles per hour.  Lap swimming.  Roller-skating or in-line skating.  Cross-country skiing.  Vigorous competitive sports, such as football, basketball, and soccer.  Jumping rope.  Aerobic dancing. How can I be more active in my day-to-day activities?  Use the stairs instead of the elevator.  Take a walk during your lunch break.  If you drive, park your car farther away from work or school.  If you take public transportation, get off one stop early and walk the rest of the way.  Make all of your phone calls while standing up and walking around.  Get up, stretch, and walk around every 30 minutes throughout the day. What guidelines should I follow while exercising?  Do not exercise so much that you hurt yourself, feel dizzy, or get very short of breath.  Consult your health care provider prior to starting a new exercise program.  Wear comfortable clothes and shoes with good support.  Drink plenty of water while you exercise to prevent dehydration or heat stroke. Body water is lost during exercise and must be replaced.  Work out until you breathe faster and your heart beats faster. This  information is not intended to replace advice given to you by your health care provider. Make sure you discuss any questions you have with your health care provider. Document Released: 04/24/2010 Document Revised: 08/28/2015 Document Reviewed: 08/23/2013 Elsevier Interactive Patient Education  2018 [place] all medications as directed. Follow-up with Neurology and Urology as directed. Follow Heart Healthy diet Increase regular exercise.  Recommend at least 30 minutes daily, 5 days per week of walking, jogging, biking, swimming, YouTube/Pinterest workout videos. Recommend Welcome to [employer] Visit and Complete physical this summer- separate office visits. NICE TO SEE YOU!

## 2017-06-16 NOTE — Assessment & Plan Note (Signed)
Has not experienced dizziness in months Unable to completed MRI/MRA due to claustrophobia Has f/u with Neuro May 2019 Advised to call Neuro is dizziness develops

## 2017-06-16 NOTE — Assessment & Plan Note (Signed)
Fasting labs obtained today Currently on Atorvastatin 20mg  QD, denies myaglia's

## 2017-06-16 NOTE — Assessment & Plan Note (Signed)
Follow up with Urology

## 2017-06-16 NOTE — Assessment & Plan Note (Signed)
Continue all medications as directed. Follow-up with Neurology and Urology as directed. Follow Heart Healthy diet Increase regular exercise.  Recommend at least 30 minutes daily, 5 days per week of walking, jogging, biking, swimming, YouTube/Pinterest workout videos. Recommend Welcome to [employer] Visit and Complete physical this summer- separate office visits.

## 2017-06-17 LAB — LIPID PANEL
CHOL/HDL RATIO: 3.2 ratio (ref 0.0–5.0)
Cholesterol, Total: 174 mg/dL (ref 100–199)
HDL: 54 mg/dL (ref >39)
LDL CALC: 99 mg/dL (ref 0–99)
Triglycerides: 103 mg/dL (ref 0–149)
VLDL Cholesterol Cal: 21 mg/dL (ref 5–40)

## 2017-06-17 LAB — COMPREHENSIVE METABOLIC PANEL
A/G RATIO: 1.6 (ref 1.2–2.2)
ALBUMIN: 4.4 g/dL (ref 3.6–4.8)
ALK PHOS: 117 IU/L (ref 39–117)
ALT: 20 IU/L (ref 0–44)
AST: 23 IU/L (ref 0–40)
BUN / CREAT RATIO: 10 (ref 10–24)
BUN: 11 mg/dL (ref 8–27)
Bilirubin Total: 0.5 mg/dL (ref 0.0–1.2)
CO2: 24 mmol/L (ref 20–29)
Calcium: 9 mg/dL (ref 8.6–10.2)
Chloride: 100 mmol/L (ref 96–106)
Creatinine, Ser: 1.09 mg/dL (ref 0.76–1.27)
GFR calc Af Amer: 82 mL/min/{1.73_m2} (ref >59)
GFR, EST NON AFRICAN AMERICAN: 71 mL/min/{1.73_m2} (ref >59)
GLOBULIN, TOTAL: 2.8 g/dL (ref 1.5–4.5)
Glucose: 91 mg/dL (ref 65–99)
POTASSIUM: 3.9 mmol/L (ref 3.5–5.2)
SODIUM: 140 mmol/L (ref 134–144)
Total Protein: 7.2 g/dL (ref 6.0–8.5)

## 2017-06-17 LAB — CBC WITH DIFFERENTIAL/PLATELET
BASOS: 1 %
Basophils Absolute: 0 10*3/uL (ref 0.0–0.2)
EOS (ABSOLUTE): 0.3 10*3/uL (ref 0.0–0.4)
EOS: 5 %
HEMATOCRIT: 47.3 % (ref 37.5–51.0)
Hemoglobin: 15.5 g/dL (ref 13.0–17.7)
Immature Grans (Abs): 0 10*3/uL (ref 0.0–0.1)
Immature Granulocytes: 0 %
LYMPHS ABS: 1.6 10*3/uL (ref 0.7–3.1)
Lymphs: 28 %
MCH: 28.4 pg (ref 26.6–33.0)
MCHC: 32.8 g/dL (ref 31.5–35.7)
MCV: 87 fL (ref 79–97)
MONOS ABS: 0.4 10*3/uL (ref 0.1–0.9)
Monocytes: 7 %
NEUTROS ABS: 3.4 10*3/uL (ref 1.4–7.0)
Neutrophils: 59 %
Platelets: 256 10*3/uL (ref 150–379)
RBC: 5.45 x10E6/uL (ref 4.14–5.80)
RDW: 13.9 % (ref 12.3–15.4)
WBC: 5.8 10*3/uL (ref 3.4–10.8)

## 2017-06-17 LAB — HEMOGLOBIN A1C
ESTIMATED AVERAGE GLUCOSE: 114 mg/dL
HEMOGLOBIN A1C: 5.6 % (ref 4.8–5.6)

## 2017-06-17 LAB — TSH: TSH: 6.83 u[IU]/mL — ABNORMAL HIGH (ref 0.450–4.500)

## 2017-06-21 ENCOUNTER — Other Ambulatory Visit: Payer: Self-pay | Admitting: Adult Health

## 2017-06-21 DIAGNOSIS — E039 Hypothyroidism, unspecified: Secondary | ICD-10-CM

## 2017-06-21 MED ORDER — LEVOTHYROXINE SODIUM 75 MCG PO TABS: 75.0000 ug | ORAL_TABLET | Freq: Every day | ORAL | 0 refills | Status: DC

## 2017-06-24 ENCOUNTER — Ambulatory Visit
Admission: RE | Admit: 2017-06-24 | Discharge: 2017-06-24 | Disposition: A | Discharge status: Home / Self Care | Payer: Medicare Other | Source: Ambulatory Visit | Attending: Adult Health | Admitting: Adult Health

## 2017-06-24 DIAGNOSIS — Z136 Encounter for screening for cardiovascular disorders: Secondary | ICD-10-CM

## 2017-06-24 DIAGNOSIS — Z87891 Personal history of nicotine dependence: Secondary | ICD-10-CM | POA: Diagnosis not present

## 2017-07-06 ENCOUNTER — Ambulatory Visit (INDEPENDENT_AMBULATORY_CARE_PROVIDER_SITE_OTHER): Payer: Medicare Other | Admitting: Adult Health

## 2017-07-06 VITALS — BP 119/74 | HR 78 | Temp 98.5°F | Ht 70.0 in | Wt 203.2 lb

## 2017-07-06 DIAGNOSIS — J209 Acute bronchitis, unspecified: Secondary | ICD-10-CM

## 2017-07-06 MED ORDER — AZITHROMYCIN 250 MG PO TABS: ORAL_TABLET | ORAL | 0 refills | Status: DC

## 2017-07-06 MED ORDER — BENZONATATE 200 MG PO CAPS: 200.0000 mg | ORAL_CAPSULE | Freq: Two times a day (BID) | ORAL | 0 refills | Status: DC | PRN

## 2017-07-06 NOTE — Assessment & Plan Note (Signed)
Please take Azithromycin and Tessalon as directed. Increase fluids/rest/vit c-2,000mg /day Alternate OTC Acetaminophen and Ibuprofen as directed by manufacturer. If symptoms persist after antibiotic completed, please call clinic.

## 2017-07-06 NOTE — Patient Instructions (Signed)

## 2017-07-06 NOTE — Progress Notes (Addendum)
Subjective:    Patient ID: Jordan Evans, male    DOB: Aug 04, 1950, 67 y.o.   MRN: [id number]  HPI:  Mr. Lenz presents with productive cough (thick/yellow mucus), post nasal gtt, and scant clear nasal drainage. He also reports intermittent occipital HA, rated 2/10 and described as dull ache. He denies CP/palpitations/fever/night sweats/N/V/D He has been increasing fluids. He denies tobacco use  Patient Care Team    Relationship Specialty Notifications Start End  Esaw Grandchild, NP PCP - General Family Medicine  08/31/16     Patient Active Problem List   Diagnosis Date Noted  . Screening for AAA (abdominal aortic aneurysm) 06/16/2017  . Hypothyroidism 06/16/2017  . Dizziness 03/17/2017  . Vasovagal syncope 03/17/2017  . Yeast infection 12/14/2016  . H/O multiple pulmonary nodules 12/14/2016  . Health care maintenance 08/31/2016  . Kidney calculi 08/31/2016  . Hernia of abdominal cavity 08/31/2016  . HTN (hypertension) 08/31/2016  . Hyperlipidemia 08/31/2016     Past Medical History:  Diagnosis Date  . History of kidney stones   . Hyperlipidemia   . Hypertension   . Hypothyroidism   . Syncope   . Thyroid disease      Past Surgical History:  Procedure Laterality Date  . EXTRACORPOREAL SHOCK WAVE LITHOTRIPSY Right 01/24/2017   Procedure: EXTRACORPOREAL SHOCK WAVE LITHOTRIPSY (ESWL);  Surgeon: Nickie Retort, MD;  Location: [facility];  Service: Urology;  Laterality: Right;  . HERNIA REPAIR     umbilical hernia- [id number]  . LITHOTRIPSY    . ROTATOR CUFF REPAIR Right   . URETHRAL DILATION     x5   . VASECTOMY       Family History  Problem Relation Age of Onset  . Cancer Mother        ovarian  . Hyperlipidemia Mother   . Hypertension Mother   . Heart attack Father   . Hyperlipidemia Father   . Hypertension Father   . Hyperlipidemia Sister   . Hypertension Sister   . Cancer Brother        lymph  . Hyperlipidemia Brother   . Hypertension Brother   .  Diabetes Paternal Grandmother   . Hyperlipidemia Brother   . Hypertension Brother   . Hyperlipidemia Brother   . Hypertension Brother   . Hyperlipidemia Brother   . Hypertension Brother      Social History   Substance and Sexual Activity  Drug Use No     Social History   Substance and Sexual Activity  Alcohol Use Yes   Comment: rarely     Social History   Tobacco Use  Smoking Status Never Smoker  Smokeless Tobacco Never Used     Outpatient Encounter Medications as of 07/06/2017  Medication Sig  . amLODipine (NORVASC) 5 MG tablet Take 1 tablet (5 mg total) by mouth daily.  Marland Kitchen atorvastatin (LIPITOR) 20 MG tablet Take 1 tablet (20 mg total) by mouth daily.  Marland Kitchen levothyroxine (SYNTHROID, LEVOTHROID) 75 MCG tablet Take 1 tablet (75 mcg total) by mouth daily before breakfast.  . triamcinolone ointment (KENALOG) 0.1 % Apply 1 application topically as needed.   No facility-administered encounter medications on file as of 07/06/2017.     Allergies: Percocet [oxycodone-acetaminophen]  Body mass index is 29.16 kg/m.  Blood pressure 119/74, pulse 78, temperature 98.5 F (36.9 C), temperature source Oral, height 5\' 10"  (1.778 m), weight 203 lb 3.2 oz (92.2 kg), SpO2 96 %.     Review of Systems  Constitutional: Positive  for activity change and fatigue. Negative for appetite change, chills, diaphoresis, fever and unexpected weight change.  HENT: Positive for congestion, postnasal drip, rhinorrhea and sinus pressure. Negative for sinus pain, sneezing and sore throat.   Eyes: Negative for visual disturbance.  Respiratory: Positive for cough and wheezing. Negative for chest tightness, shortness of breath and stridor.   Cardiovascular: Negative for chest pain, palpitations and leg swelling.  Gastrointestinal: Negative for abdominal distention, abdominal pain, blood in stool, constipation, diarrhea, nausea and vomiting.  Endocrine: Negative for cold intolerance, heat intolerance,  polydipsia, polyphagia and polyuria.  Genitourinary: Negative for difficulty urinating and flank pain.  Neurological: Positive for headaches. Negative for dizziness.  Hematological: Does not bruise/bleed easily.  Psychiatric/Behavioral: Positive for sleep disturbance.       Objective:   Physical Exam  Constitutional: He is oriented to person, place, and time. He appears well-developed and well-nourished. No distress.  HENT:  Head: Normocephalic and atraumatic.  Right Ear: Hearing, external ear and ear canal normal. Tympanic membrane is bulging. Tympanic membrane is not erythematous. No decreased hearing is noted.  Left Ear: Hearing, external ear and ear canal normal. Tympanic membrane is bulging. Tympanic membrane is not erythematous. No decreased hearing is noted.  Nose: Mucosal edema and rhinorrhea present. Right sinus exhibits no maxillary sinus tenderness and no frontal sinus tenderness. Left sinus exhibits no maxillary sinus tenderness and no frontal sinus tenderness.  Mouth/Throat: Uvula is midline and mucous membranes are normal. Posterior oropharyngeal edema and posterior oropharyngeal erythema present. No oropharyngeal exudate or tonsillar abscesses.  Eyes: Pupils are equal, round, and reactive to light. Conjunctivae are normal.  Neck: Normal range of motion. Neck supple.  Cardiovascular: Normal rate, regular rhythm, normal heart sounds and intact distal pulses.  No murmur heard. Pulmonary/Chest: Effort normal and breath sounds normal. No respiratory distress. He has no wheezes. He has no rales. He exhibits no tenderness.  Lymphadenopathy:    He has no cervical adenopathy.  Neurological: He is alert and oriented to person, place, and time.  Skin: Skin is warm and dry. No rash noted. He is not diaphoretic. No erythema. No pallor.  Psychiatric: He has a normal mood and affect. His behavior is normal. Judgment and thought content normal.  Nursing note and vitals reviewed.          Assessment & Plan:   1. Acute bronchitis, unspecified organism     Acute bronchitis Please take Azithromycin and Tessalon as directed. Increase fluids/rest/vit c-2,000mg /day Alternate OTC Acetaminophen and Ibuprofen as directed by manufacturer. If symptoms persist after antibiotic completed, please call clinic.    FOLLOW-UP:  Return if symptoms worsen or fail to improve.

## 2017-07-07 ENCOUNTER — Ambulatory Visit: Payer: Medicare Other | Admitting: Adult Health

## 2017-08-09 ENCOUNTER — Telehealth: Payer: Self-pay | Admitting: Adult Health

## 2017-08-09 NOTE — Telephone Encounter (Signed)
Patient is questioning when he needs to come in for updated thyroid blood work. He thought six weeks but isn't for sure certain. He has a f/u sch on at the end of the month and can wait til then or come in sooner if needed. Please advise

## 2017-08-11 NOTE — Telephone Encounter (Signed)
Pt informed that he is to have TSH repeated now.  Pt expressed understanding and is agreeable.  Charyl Bigger, CMA

## 2017-08-15 ENCOUNTER — Other Ambulatory Visit (INDEPENDENT_AMBULATORY_CARE_PROVIDER_SITE_OTHER): Payer: Medicare Other

## 2017-08-15 DIAGNOSIS — E039 Hypothyroidism, unspecified: Secondary | ICD-10-CM

## 2017-08-16 ENCOUNTER — Other Ambulatory Visit: Payer: Self-pay | Admitting: Adult Health

## 2017-08-16 ENCOUNTER — Ambulatory Visit: Payer: Medicare Other | Admitting: Diagnostic Neuroimaging

## 2017-08-16 LAB — TSH: TSH: 0.491 u[IU]/mL (ref 0.450–4.500)

## 2017-08-25 ENCOUNTER — Encounter: Payer: Self-pay | Admitting: Adult Health

## 2017-08-25 ENCOUNTER — Ambulatory Visit (INDEPENDENT_AMBULATORY_CARE_PROVIDER_SITE_OTHER): Payer: Medicare Other | Admitting: Adult Health

## 2017-08-25 VITALS — BP 128/76 | HR 68 | Ht 70.0 in | Wt 198.6 lb

## 2017-08-25 DIAGNOSIS — Z0000 Encounter for general adult medical examination without abnormal findings: Secondary | ICD-10-CM

## 2017-08-25 DIAGNOSIS — Z1211 Encounter for screening for malignant neoplasm of colon: Secondary | ICD-10-CM | POA: Insufficient documentation

## 2017-08-25 NOTE — Assessment & Plan Note (Addendum)
EKG- NSR with Incomplete RBBB Pt reports hx of RBBB, he denies acute cardiac sx's Continue regular exercise and tracking your blood pressure/heart rate. Follow-up in 6 months for chronic medical conditions.

## 2017-08-25 NOTE — Patient Instructions (Addendum)
Jordan Evans , Thank you for taking time to come for your Medicare Wellness Visit. I appreciate your ongoing commitment to your health goals. Please review the following plan we discussed and let me know if I can assist you in the future.   These are the goals we discussed: Goals    None     Continue daily biking and heart healthy diet. Continue to abstain from tobacco use  This is a list of the screening recommended for you and due dates:  Health Maintenance  Topic Date Due  .  Hepatitis C: One time screening is recommended by Center for Disease Control  (CDC) for  adults born from [birth year] through 1964.   09/05/2017*  . HIV Screening  09/05/2017*  . Pneumonia vaccines (1 of 2 - PCV13) 06/17/2018*  . Flu Shot  11/03/2017  . Colon Cancer Screening  06/14/2021  . Tetanus Vaccine  03/10/2027  *Topic was postponed. The date shown is not the original due date.    Mediterranean Diet A Mediterranean diet refers to food and lifestyle choices that are based on the traditions of countries located on the [employer]. This way of eating has been shown to help prevent certain conditions and improve outcomes for people who have chronic diseases, like kidney disease and heart disease. What are tips for following this plan? Lifestyle  Cook and eat meals together with your family, when possible.  Drink enough fluid to keep your urine clear or pale yellow.  Be physically active every day. This includes: ? Aerobic exercise like running or swimming. ? Leisure activities like gardening, walking, or housework.  Get 7-8 hours of sleep each night.  If recommended by your health care provider, drink red wine in moderation. This means 1 glass a day for nonpregnant women and 2 glasses a day for men. A glass of wine equals 5 oz (150 mL). Reading food labels  Check the serving size of packaged foods. For foods such as rice and pasta, the serving size refers to the amount of cooked product, not  dry.  Check the total fat in packaged foods. Avoid foods that have saturated fat or trans fats.  Check the ingredients list for added sugars, such as corn syrup. Shopping  At the grocery store, buy most of your food from the areas near the walls of the store. This includes: ? Fresh fruits and vegetables (produce). ? Grains, beans, nuts, and seeds. Some of these may be available in unpackaged forms or large amounts (in bulk). ? Fresh seafood. ? Poultry and eggs. ? Low-fat dairy products.  Buy whole ingredients instead of prepackaged foods.  Buy fresh fruits and vegetables in-season from local farmers markets.  Buy frozen fruits and vegetables in resealable bags.  If you do not have access to quality fresh seafood, buy precooked frozen shrimp or canned fish, such as tuna, salmon, or sardines.  Buy small amounts of raw or cooked vegetables, salads, or olives from the deli or salad bar at your store.  Stock your pantry so you always have certain foods on hand, such as olive oil, canned tuna, canned tomatoes, rice, pasta, and beans. Cooking  Cook foods with extra-virgin olive oil instead of using butter or other vegetable oils.  Have meat as a side dish, and have vegetables or grains as your main dish. This means having meat in small portions or adding small amounts of meat to foods like pasta or stew.  Use beans or vegetables instead of meat in  common dishes like chili or lasagna.  Experiment with different cooking methods. Try roasting or broiling vegetables instead of steaming or sauteing them.  Add frozen vegetables to soups, stews, pasta, or rice.  Add nuts or seeds for added healthy fat at each meal. You can add these to yogurt, salads, or vegetable dishes.  Marinate fish or vegetables using olive oil, lemon juice, garlic, and fresh herbs. Meal planning  Plan to eat 1 vegetarian meal one day each week. Try to work up to 2 vegetarian meals, if possible.  Eat seafood 2 or  more times a week.  Have healthy snacks readily available, such as: ? Vegetable sticks with hummus. ? [place] yogurt. ? Fruit and nut trail mix.  Eat balanced meals throughout the week. This includes: ? Fruit: 2-3 servings a day ? Vegetables: 4-5 servings a day ? Low-fat dairy: 2 servings a day ? Fish, poultry, or lean meat: 1 serving a day ? Beans and legumes: 2 or more servings a week ? Nuts and seeds: 1-2 servings a day ? Whole grains: 6-8 servings a day ? Extra-virgin olive oil: 3-4 servings a day  Limit red meat and sweets to only a few servings a month What are my food choices?  Mediterranean diet ? Recommended ? Grains: Whole-grain pasta. Brown rice. Bulgar wheat. Polenta. Couscous. Whole-wheat bread. [place]. ? Vegetables: Artichokes. Beets. Broccoli. Cabbage. Carrots. Eggplant. Green beans. Chard. Kale. Spinach. Onions. Leeks. Peas. Squash. Tomatoes. Peppers. Radishes. ? Fruits: Apples. Apricots. Avocado. Berries. Bananas. Cherries. Dates. Figs. Grapes. Lemons. Melon. Oranges. Peaches. Plums. Pomegranate. ? Meats and other protein foods: Beans. Almonds. Sunflower seeds. Pine nuts. Peanuts. [place]. Salmon. Scallops. Shrimp. [place]. Tilapia. Clams. Oysters. Eggs. ? Dairy: Low-fat milk. Cheese. Greek yogurt. ? Beverages: Water. Red wine. Herbal tea. ? Fats and oils: Extra virgin olive oil. Avocado oil. Grape seed oil. ? Sweets and desserts: [place] yogurt with honey. Baked apples. Poached pears. Trail mix. ? Seasoning and other foods: Basil. Cilantro. Coriander. Cumin. Mint. Parsley. Sage. Rosemary. Tarragon. Garlic. Oregano. Thyme. Pepper. Balsalmic vinegar. Tahini. Hummus. Tomato sauce. Olives. Mushrooms. ? Limit these ? Grains: Prepackaged pasta or rice dishes. Prepackaged cereal with added sugar. ? Vegetables: Deep fried potatoes (french fries). ? Fruits: Fruit canned in syrup. ? Meats and other protein foods: Beef. Pork. Lamb. Poultry with skin. Hot dogs. [place] Salines. ? Dairy:  Ice cream. Sour cream. Whole milk. ? Beverages: Juice. Sugar-sweetened soft drinks. Beer. Liquor and spirits. ? Fats and oils: Butter. Canola oil. Vegetable oil. Beef fat (tallow). Lard. ? Sweets and desserts: Cookies. Cakes. Pies. Candy. ? Seasoning and other foods: Mayonnaise. Premade sauces and marinades. ? The items listed may not be a complete list. Talk with your dietitian about what dietary choices are right for you. Summary  The Mediterranean diet includes both food and lifestyle choices.  Eat a variety of fresh fruits and vegetables, beans, nuts, seeds, and whole grains.  Limit the amount of red meat and sweets that you eat.  Talk with your health care provider about whether it is safe for you to drink red wine in moderation. This means 1 glass a day for nonpregnant women and 2 glasses a day for men. A glass of wine equals 5 oz (150 mL). This information is not intended to replace advice given to you by your health care provider. Make sure you discuss any questions you have with your health care provider. Document Released: 11/13/2015 Document Revised: 12/16/2015 Document Reviewed: 11/13/2015 Elsevier Interactive Patient Education  2018  [place] ARE DOING A GREAT JOB WITH YOUR OVERALL HEALTH! Continue regular exercise and tracking your blood pressure/heart rate. Follow-up in 6 months for chronic medical conditions. NICE TO SEE YOU!

## 2017-08-25 NOTE — Progress Notes (Signed)
Subjective:   Jordan Evans is a 67 y.o. male who presents for an Initial Medicare Annual Wellness Visit.  Review of Systems: General:   Denies fever, chills, unexplained weight loss.  Optho/Auditory:   Denies visual changes, blurred vision/LOV Respiratory:   Denies SOB, DOE more than baseline levels.  Cardiovascular:   Denies chest pain, palpitations, new onset peripheral edema  Gastrointestinal:   Denies nausea, vomiting, diarrhea.  Genitourinary: Denies dysuria, freq/ urgency, flank pain or discharge from genitals.  Endocrine:     Denies hot or cold intolerance, polyuria, polydipsia. Musculoskeletal:   Denies unexplained myalgias, joint swelling, unexplained arthralgias, gait problems.  Skin:  Denies rash, suspicious lesions Neurological:     Denies dizziness, unexplained weakness, numbness  Psychiatric/Behavioral:   Denies mood changes, suicidal or homicidal ideations, hallucinations       Objective:    Today's Vitals   08/25/17 1307  BP: 128/76  Pulse: 68  SpO2: 97%  Weight: 198 lb 9.6 oz (90.1 kg)  Height: 5\' 10"  (1.778 m)   Body mass index is 28.5 kg/m.  Advanced Directives 03/12/2017 01/24/2017 08/31/2016  Does Patient Have a Medical Advance Directive? No No No  Would patient like information on creating a medical advance directive? No - Patient declined Yes (MAU/Ambulatory/Procedural Areas - Information given) Yes (MAU/Ambulatory/Procedural Areas - Information given)    Current Medications (verified) Outpatient Encounter Medications as of 08/25/2017  Medication Sig  . amLODipine (NORVASC) 5 MG tablet Take 1 tablet (5 mg total) by mouth daily.  Marland Kitchen atorvastatin (LIPITOR) 20 MG tablet TAKE 1 TABLET BY MOUTH EVERY DAY  . levothyroxine (SYNTHROID, LEVOTHROID) 75 MCG tablet Take 1 tablet (75 mcg total) by mouth daily before breakfast.  . triamcinolone ointment (KENALOG) 0.1 % Apply 1 application topically as needed.  . [DISCONTINUED] amLODipine (NORVASC) 10 MG tablet  TAKE 1 TABLET BY MOUTH EVERY DAY  . [DISCONTINUED] azithromycin (ZITHROMAX) 250 MG tablet 2 tabs days one. 1 tab days 2-5  . [DISCONTINUED] benzonatate (TESSALON) 200 MG capsule Take 1 capsule (200 mg total) by mouth 2 (two) times daily as needed for cough.  . [DISCONTINUED] levothyroxine (SYNTHROID, LEVOTHROID) 50 MCG tablet TAKE 1 TABLET BY MOUTH EVERY DAY BEFORE BREAKFAST   No facility-administered encounter medications on file as of 08/25/2017.     Allergies (verified) Percocet [oxycodone-acetaminophen]   History: Past Medical History:  Diagnosis Date  . History of kidney stones   . Hyperlipidemia   . Hypertension   . Hypothyroidism   . Syncope   . Thyroid disease    Past Surgical History:  Procedure Laterality Date  . EXTRACORPOREAL SHOCK WAVE LITHOTRIPSY Right 01/24/2017   Procedure: EXTRACORPOREAL SHOCK WAVE LITHOTRIPSY (ESWL);  Surgeon: Nickie Retort, MD;  Location: [facility];  Service: Urology;  Laterality: Right;  . HERNIA REPAIR     umbilical hernia- [phone]  . LITHOTRIPSY    . ROTATOR CUFF REPAIR Right   . URETHRAL DILATION     x5   . VASECTOMY     Family History  Problem Relation Age of Onset  . Cancer Mother        ovarian  . Hyperlipidemia Mother   . Hypertension Mother   . Heart attack Father   . Hyperlipidemia Father   . Hypertension Father   . Hyperlipidemia Sister   . Hypertension Sister   . Cancer Brother        lymph  . Hyperlipidemia Brother   . Hypertension Brother   . Diabetes  Paternal Grandmother   . Hyperlipidemia Brother   . Hypertension Brother   . Hyperlipidemia Brother   . Hypertension Brother   . Hyperlipidemia Brother   . Hypertension Brother    Social History   Socioeconomic History  . Marital status: Married    Spouse name: Not on file  . Number of children: Not on file  . Years of education: Not on file  . Highest education level: Not on file  Occupational History  . Not on file  Social Needs  . Financial resource  strain: Not on file  . Food insecurity:    Worry: Not on file    Inability: Not on file  . Transportation needs:    Medical: Not on file    Non-medical: Not on file  Tobacco Use  . Smoking status: Never Smoker  . Smokeless tobacco: Never Used  Substance and Sexual Activity  . Alcohol use: Yes    Comment: rarely  . Drug use: No  . Sexual activity: Yes    Birth control/protection: Surgical  Lifestyle  . Physical activity:    Days per week: Not on file    Minutes per session: Not on file  . Stress: Not on file  Relationships  . Social connections:    Talks on phone: Not on file    Gets together: Not on file    Attends religious service: Not on file    Active member of club or organization: Not on file    Attends meetings of clubs or organizations: Not on file    Relationship status: Not on file  Other Topics Concern  . Not on file  Social History Narrative   Lives with wife   's[employer], works for [employer]   Children 5   Caffeine- 1 soda daily   Tobacco Counseling Counseling given: Not Answered   Clinical Intake: EKG- NSR, with incomplete RBBB He reports hx of RBBB He denies acute cardiac sx's  Activities of Daily Living In your present state of health, do you have any difficulty performing the following activities: 08/25/2017 01/24/2017  Hearing? N N  Vision? N N  Difficulty concentrating or making decisions? N N  Walking or climbing stairs? N N  Dressing or bathing? N N  Doing errands, shopping? N -  Some recent data might be hidden     Immunizations and Health Maintenance Immunization History  Administered Date(s) Administered  . Influenza, High Dose Seasonal PF 02/07/2017  . Influenza-Unspecified 02/07/2017  . Tdap 04/06/2011, 03/09/2017   There are no preventive care reminders to display for this patient.  Patient Care Team: Esaw Grandchild, NP as PCP - General (Family Medicine)  Indicate any recent Medical Services you may have  received from other than [facility] providers in the past year (date may be approximate).    Assessment:   This is a routine wellness examination for Jordan Evans.  Hearing/Vision screen  Visual Acuity Screening   Right eye Left eye Both eyes  Without correction:     With correction: 20/30 20/30 20/30     Dietary issues and exercise activities discussed:    Goals    None    Continue daily biking and heart healthy diet. Continue to abstain from tobacco use  Depression Screen PHQ 2/9 Scores 08/25/2017 07/06/2017 06/16/2017 03/17/2017  PHQ - 2 Score 0 0 0 0  PHQ- 9 Score 0 0 0 0    Fall Risk Fall Risk  08/25/2017 03/21/2017  Falls in the past year?  Yes Yes  Number falls in past yr: 1 1  Injury with Fall? Yes Yes  Comment - hit head  Risk for fall due to : - (No Data)  Risk for fall due to: Comment - passed out    Is the patient's home free of loose throw rugs in walkways, pet beds, electrical cords, etc?   yes      Grab bars in the bathroom? no      Handrails on the stairs?   no      Adequate lighting?   yes  Timed Get Up and Go performed: WNL  Cognitive Function:  WNL   6CIT Screen 08/25/2017  What Year? 0 points  What month? 0 points  What time? 0 points  Count back from 20 0 points  Months in reverse 0 points  Repeat phrase 0 points  Total Score 0    Screening Tests Health Maintenance  Topic Date Due  . Hepatitis C Screening  09/05/2017 (Originally May 29, 1950)  . HIV Screening  09/05/2017 (Originally 01/14/1966)  . PNA vac Low Risk Adult (1 of 2 - PCV13) 06/17/2018 (Originally 01/13/2017)  . INFLUENZA VACCINE  11/03/2017  . COLONOSCOPY  06/14/2021  . TETANUS/TDAP  03/10/2027    Qualifies for Shingles Vaccine? Declined  Cancer Screenings: Lung: Low Dose CT Chest recommended if Age 54-80 years, 30 pack-year currently smoking OR have quit w/in 15years. Patient does not qualify. Colorectal: UTD  Additional Screenings:  Hepatitis C Screening: Declined      Plan:    Mediterranean Diet and excellent water intake. Continue daily biking- GREAT JOB! Continue to abstain from tobacco use   I have personally reviewed and noted the following in the patient's chart:   . Medical and social history . Use of alcohol, tobacco or illicit drugs  . Current medications and supplements . Functional ability and status . Nutritional status . Physical activity . Advanced directives . List of other physicians . Hospitalizations, surgeries, and ER visits in previous 12 months . Vitals . Screenings to include cognitive, depression, and falls . Referrals and appointments  In addition, I have reviewed and discussed with patient certain preventive protocols, quality metrics, and best practice recommendations. A written personalized care plan for preventive services as well as general preventive health recommendations were provided to patient.     Esaw Grandchild, NP   08/25/2017

## 2017-08-25 NOTE — Addendum Note (Signed)
Addended by: Fonnie Mu on: 08/25/2017 02:16 PM   Modules accepted: Orders

## 2017-08-27 ENCOUNTER — Other Ambulatory Visit: Payer: Self-pay

## 2017-08-27 ENCOUNTER — Ambulatory Visit (HOSPITAL_COMMUNITY)
Admission: EM | Admit: 2017-08-27 | Discharge: 2017-08-27 | Disposition: A | Discharge status: Home / Self Care | Payer: Medicare Other

## 2017-08-27 ENCOUNTER — Emergency Department (HOSPITAL_COMMUNITY)
Admission: EM | Admit: 2017-08-27 | Discharge: 2017-08-27 | Disposition: A | Discharge status: Home / Self Care | Payer: Medicare Other | Attending: Emergency Medicine | Admitting: Emergency Medicine

## 2017-08-27 ENCOUNTER — Emergency Department (HOSPITAL_COMMUNITY): Payer: Medicare Other

## 2017-08-27 ENCOUNTER — Encounter (HOSPITAL_COMMUNITY): Payer: Self-pay | Admitting: Emergency Medicine

## 2017-08-27 DIAGNOSIS — R509 Fever, unspecified: Secondary | ICD-10-CM | POA: Insufficient documentation

## 2017-08-27 DIAGNOSIS — R197 Diarrhea, unspecified: Secondary | ICD-10-CM | POA: Diagnosis not present

## 2017-08-27 DIAGNOSIS — R000 Tachycardia, unspecified: Secondary | ICD-10-CM | POA: Diagnosis not present

## 2017-08-27 DIAGNOSIS — Z5321 Procedure and treatment not carried out due to patient leaving prior to being seen by health care provider: Secondary | ICD-10-CM | POA: Insufficient documentation

## 2017-08-27 LAB — COMPREHENSIVE METABOLIC PANEL
ALBUMIN: 3.6 g/dL (ref 3.5–5.0)
ALT: 43 U/L (ref 17–63)
AST: 47 U/L — AB (ref 15–41)
Alkaline Phosphatase: 104 U/L (ref 38–126)
Anion gap: 7 (ref 5–15)
BILIRUBIN TOTAL: 1.1 mg/dL (ref 0.3–1.2)
BUN: 14 mg/dL (ref 6–20)
CHLORIDE: 105 mmol/L (ref 101–111)
CO2: 22 mmol/L (ref 22–32)
Calcium: 8.4 mg/dL — ABNORMAL LOW (ref 8.9–10.3)
Creatinine, Ser: 1.22 mg/dL (ref 0.61–1.24)
GFR calc Af Amer: >60 mL/min (ref >60)
GFR calc non Af Amer: >60 mL/min (ref >60)
GLUCOSE: 117 mg/dL — AB (ref 65–99)
POTASSIUM: 3.9 mmol/L (ref 3.5–5.1)
SODIUM: 134 mmol/L — AB (ref 135–145)
TOTAL PROTEIN: 7.3 g/dL (ref 6.5–8.1)

## 2017-08-27 LAB — URINALYSIS, ROUTINE W REFLEX MICROSCOPIC
Bacteria, UA: NONE SEEN
Bilirubin Urine: NEGATIVE
Glucose, UA: NEGATIVE mg/dL
Ketones, ur: NEGATIVE mg/dL
Leukocytes, UA: NEGATIVE
Nitrite: NEGATIVE
Protein, ur: NEGATIVE mg/dL
Specific Gravity, Urine: 1.024 (ref 1.005–1.030)
pH: 5 (ref 5.0–8.0)

## 2017-08-27 LAB — I-STAT TROPONIN, ED: Troponin i, poc: 0 ng/mL (ref 0.00–0.08)

## 2017-08-27 LAB — CBC WITH DIFFERENTIAL/PLATELET
ABS IMMATURE GRANULOCYTES: 0 10*3/uL (ref 0.0–0.1)
BASOS ABS: 0 10*3/uL (ref 0.0–0.1)
Basophils Relative: 0 %
EOS PCT: 1 %
Eosinophils Absolute: 0.1 10*3/uL (ref 0.0–0.7)
HEMATOCRIT: 46.1 % (ref 39.0–52.0)
HEMOGLOBIN: 15.3 g/dL (ref 13.0–17.0)
Immature Granulocytes: 0 %
LYMPHS ABS: 0.7 10*3/uL (ref 0.7–4.0)
LYMPHS PCT: 10 %
MCH: 28.4 pg (ref 26.0–34.0)
MCHC: 33.2 g/dL (ref 30.0–36.0)
MCV: 85.5 fL (ref 78.0–100.0)
MONO ABS: 0.9 10*3/uL (ref 0.1–1.0)
MONOS PCT: 13 %
NEUTROS ABS: 5.5 10*3/uL (ref 1.7–7.7)
Neutrophils Relative %: 76 %
Platelets: 210 10*3/uL (ref 150–400)
RBC: 5.39 MIL/uL (ref 4.22–5.81)
RDW: 12.6 % (ref 11.5–15.5)
WBC: 7.2 10*3/uL (ref 4.0–10.5)

## 2017-08-27 LAB — LIPASE, BLOOD: Lipase: 72 U/L — ABNORMAL HIGH (ref 11–51)

## 2017-08-27 LAB — I-STAT CG4 LACTIC ACID, ED: Lactic Acid, Venous: 1.1 mmol/L (ref 0.5–1.9)

## 2017-08-27 NOTE — ED Notes (Signed)
Per pt & wife: started with constant diarrhea 4 days ago.  Now having fevers since yesterday.  Wife states his HR has been irregular PTA.  HR currently 130's, but regular at this time, however varies from 110s-150s.  C/O neck and shoulder pain x 1 wk.  Discussed with Juline Patch, NP - recommended pt go to ED.

## 2017-08-27 NOTE — ED Triage Notes (Signed)
Pt reports diarrhea since tuesday, fever of 100.8 at home, fast heart rate, and abdominal pain. Denies chest pain at this time. Denies being on antibiotics recently. HR 133 in triage. Sent from [facility]

## 2017-09-03 ENCOUNTER — Encounter

## 2017-09-08 ENCOUNTER — Ambulatory Visit (INDEPENDENT_AMBULATORY_CARE_PROVIDER_SITE_OTHER): Payer: Medicare Other | Admitting: Adult Health

## 2017-09-08 ENCOUNTER — Encounter: Payer: Self-pay | Admitting: Adult Health

## 2017-09-08 VITALS — BP 104/64 | HR 69 | Ht 70.0 in | Wt 199.2 lb

## 2017-09-08 DIAGNOSIS — I10 Essential (primary) hypertension: Secondary | ICD-10-CM

## 2017-09-08 DIAGNOSIS — I499 Cardiac arrhythmia, unspecified: Secondary | ICD-10-CM | POA: Diagnosis not present

## 2017-09-08 DIAGNOSIS — Z0000 Encounter for general adult medical examination without abnormal findings: Secondary | ICD-10-CM | POA: Diagnosis not present

## 2017-09-08 LAB — EKG 12-LEAD

## 2017-09-08 NOTE — Patient Instructions (Addendum)
Hypertension Hypertension, commonly called high blood pressure, is when the force of blood pumping through the arteries is too strong. The arteries are the blood vessels that carry blood from the heart throughout the body. Hypertension forces the heart to work harder to pump blood and may cause arteries to become narrow or stiff. Having untreated or uncontrolled hypertension can cause heart attacks, strokes, kidney disease, and other problems. A blood pressure reading consists of a higher number over a lower number. Ideally, your blood pressure should be below 120/80. The first ("top") number is called the systolic pressure. It is a measure of the pressure in your arteries as your heart beats. The second ("bottom") number is called the diastolic pressure. It is a measure of the pressure in your arteries as the heart relaxes. What are the causes? The cause of this condition is not known. What increases the risk? Some risk factors for high blood pressure are under your control. Others are not. Factors you can change  Smoking.  Having type 2 diabetes mellitus, high cholesterol, or both.  Not getting enough exercise or physical activity.  Being overweight.  Having too much fat, sugar, calories, or salt (sodium) in your diet.  Drinking too much alcohol. Factors that are difficult or impossible to change  Having chronic kidney disease.  Having a family history of high blood pressure.  Age. Risk increases with age.  Race. You may be at higher risk if you are African-American.  Gender. Men are at higher risk than women before age 45. After age 65, women are at higher risk than men.  Having obstructive sleep apnea.  Stress. What are the signs or symptoms? Extremely high blood pressure (hypertensive crisis) may cause:  Headache.  Anxiety.  Shortness of breath.  Nosebleed.  Nausea and vomiting.  Severe chest pain.  Jerky movements you cannot control (seizures).  How is this  diagnosed? This condition is diagnosed by measuring your blood pressure while you are seated, with your arm resting on a surface. The cuff of the blood pressure monitor will be placed directly against the skin of your upper arm at the level of your heart. It should be measured at least twice using the same arm. Certain conditions can cause a difference in blood pressure between your right and left arms. Certain factors can cause blood pressure readings to be lower or higher than normal (elevated) for a short period of time:  When your blood pressure is higher when you are in a health care provider's office than when you are at home, this is called white coat hypertension. Most people with this condition do not need medicines.  When your blood pressure is higher at home than when you are in a health care provider's office, this is called masked hypertension. Most people with this condition may need medicines to control blood pressure.  If you have a high blood pressure reading during one visit or you have normal blood pressure with other risk factors:  You may be asked to return on a different day to have your blood pressure checked again.  You may be asked to monitor your blood pressure at home for 1 week or longer.  If you are diagnosed with hypertension, you may have other blood or imaging tests to help your health care provider understand your overall risk for other conditions. How is this treated? This condition is treated by making healthy lifestyle changes, such as eating healthy foods, exercising more, and reducing your alcohol intake. Your   health care provider may prescribe medicine if lifestyle changes are not enough to get your blood pressure under control, and if:  Your systolic blood pressure is above 130.  Your diastolic blood pressure is above 80.  Your personal target blood pressure may vary depending on your medical conditions, your age, and other factors. Follow these  instructions at home: Eating and drinking  Eat a diet that is high in fiber and potassium, and low in sodium, added sugar, and fat. An example eating plan is called the DASH (Dietary Approaches to Stop Hypertension) diet. To eat this way: ? Eat plenty of fresh fruits and vegetables. Try to fill half of your plate at each meal with fruits and vegetables. ? Eat whole grains, such as whole wheat pasta, brown rice, or whole grain bread. Fill about one quarter of your plate with whole grains. ? Eat or drink low-fat dairy products, such as skim milk or low-fat yogurt. ? Avoid fatty cuts of meat, processed or cured meats, and poultry with skin. Fill about one quarter of your plate with lean proteins, such as fish, chicken without skin, beans, eggs, and tofu. ? Avoid premade and processed foods. These tend to be higher in sodium, added sugar, and fat.  Reduce your daily sodium intake. Most people with hypertension should eat less than 1,500 mg of sodium a day.  Limit alcohol intake to no more than 1 drink a day for nonpregnant women and 2 drinks a day for men. One drink equals 12 oz of beer, 5 oz of wine, or 1 oz of hard liquor. Lifestyle  Work with your health care provider to maintain a healthy body weight or to lose weight. Ask what an ideal weight is for you.  Get at least 30 minutes of exercise that causes your heart to beat faster (aerobic exercise) most days of the week. Activities may include walking, swimming, or biking.  Include exercise to strengthen your muscles (resistance exercise), such as pilates or lifting weights, as part of your weekly exercise routine. Try to do these types of exercises for 30 minutes at least 3 days a week.  Do not use any products that contain nicotine or tobacco, such as cigarettes and e-cigarettes. If you need help quitting, ask your health care provider.  Monitor your blood pressure at home as told by your health care provider.  Keep all follow-up visits as  told by your health care provider. This is important. Medicines  Take over-the-counter and prescription medicines only as told by your health care provider. Follow directions carefully. Blood pressure medicines must be taken as prescribed.  Do not skip doses of blood pressure medicine. Doing this puts you at risk for problems and can make the medicine less effective.  Ask your health care provider about side effects or reactions to medicines that you should watch for. Contact a health care provider if:  You think you are having a reaction to a medicine you are taking.  You have headaches that keep coming back (recurring).  You feel dizzy.  You have swelling in your ankles.  You have trouble with your vision. Get help right away if:  You develop a severe headache or confusion.  You have unusual weakness or numbness.  You feel faint.  You have severe pain in your chest or abdomen.  You vomit repeatedly.  You have trouble breathing. Summary  Hypertension is when the force of blood pumping through your arteries is too strong. If this condition is not   controlled, it may put you at risk for serious complications.  Your personal target blood pressure may vary depending on your medical conditions, your age, and other factors. For most people, a normal blood pressure is less than 120/80.  Hypertension is treated with lifestyle changes, medicines, or a combination of both. Lifestyle changes include weight loss, eating a healthy, low-sodium diet, exercising more, and limiting alcohol. This information is not intended to replace advice given to you by your health care provider. Make sure you discuss any questions you have with your health care provider. Document Released: 03/22/2005 Document Revised: 02/18/2016 Document Reviewed: 02/18/2016 Elsevier Interactive Patient Education  2018 [place] refers to food and lifestyle choices that  are based on the traditions of countries located on the [employer]. This way of eating has been shown to help prevent certain conditions and improve outcomes for people who have chronic diseases, like kidney disease and heart disease. What are tips for following this plan? Lifestyle  Cook and eat meals together with your family, when possible.  Drink enough fluid to keep your urine clear or pale yellow.  Be physically active every day. This includes: ? Aerobic exercise like running or swimming. ? Leisure activities like gardening, walking, or housework.  Get 7-8 hours of sleep each night.  If recommended by your health care provider, drink red wine in moderation. This means 1 glass a day for nonpregnant women and 2 glasses a day for men. A glass of wine equals 5 oz (150 mL). Reading food labels  Check the serving size of packaged foods. For foods such as rice and pasta, the serving size refers to the amount of cooked product, not dry.  Check the total fat in packaged foods. Avoid foods that have saturated fat or trans fats.  Check the ingredients list for added sugars, such as corn syrup. Shopping  At the grocery store, buy most of your food from the areas near the walls of the store. This includes: ? Fresh fruits and vegetables (produce). ? Grains, beans, nuts, and seeds. Some of these may be available in unpackaged forms or large amounts (in bulk). ? Fresh seafood. ? Poultry and eggs. ? Low-fat dairy products.  Buy whole ingredients instead of prepackaged foods.  Buy fresh fruits and vegetables in-season from local farmers markets.  Buy frozen fruits and vegetables in resealable bags.  If you do not have access to quality fresh seafood, buy precooked frozen shrimp or canned fish, such as tuna, salmon, or sardines.  Buy small amounts of raw or cooked vegetables, salads, or olives from the deli or salad bar at your store.  Stock your pantry so you always have certain  foods on hand, such as olive oil, canned tuna, canned tomatoes, rice, pasta, and beans. Cooking  Cook foods with extra-virgin olive oil instead of using butter or other vegetable oils.  Have meat as a side dish, and have vegetables or grains as your main dish. This means having meat in small portions or adding small amounts of meat to foods like pasta or stew.  Use beans or vegetables instead of meat in common dishes like chili or lasagna.  Experiment with different cooking methods. Try roasting or broiling vegetables instead of steaming or sauteing them.  Add frozen vegetables to soups, stews, pasta, or rice.  Add nuts or seeds for added healthy fat at each meal. You can add these to yogurt, salads, or vegetable dishes.  Marinate  fish or vegetables using olive oil, lemon juice, garlic, and fresh herbs. Meal planning  Plan to eat 1 vegetarian meal one day each week. Try to work up to 2 vegetarian meals, if possible.  Eat seafood 2 or more times a week.  Have healthy snacks readily available, such as: ? Vegetable sticks with hummus. ? [place] yogurt. ? Fruit and nut trail mix.  Eat balanced meals throughout the week. This includes: ? Fruit: 2-3 servings a day ? Vegetables: 4-5 servings a day ? Low-fat dairy: 2 servings a day ? Fish, poultry, or lean meat: 1 serving a day ? Beans and legumes: 2 or more servings a week ? Nuts and seeds: 1-2 servings a day ? Whole grains: 6-8 servings a day ? Extra-virgin olive oil: 3-4 servings a day  Limit red meat and sweets to only a few servings a month What are my food choices?  Mediterranean diet ? Recommended ? Grains: Whole-grain pasta. Brown rice. Bulgar wheat. Polenta. Couscous. Whole-wheat bread. [place]. ? Vegetables: Artichokes. Beets. Broccoli. Cabbage. Carrots. Eggplant. Green beans. Chard. Kale. Spinach. Onions. Leeks. Peas. Squash. Tomatoes. Peppers. Radishes. ? Fruits: Apples. Apricots. Avocado. Berries. Bananas.  [place]. Dates. Figs. Grapes. Lemons. Melon. Oranges. Peaches. Plums. Pomegranate. ? Meats and other protein foods: Beans. Almonds. Sunflower seeds. Pine nuts. Peanuts. [place]. Salmon. [place]all[place]. Shrimp. [place]. Tilapia. Clams. Oysters. Eggs. ? Dairy: Low-fat milk. Cheese. Greek yogurt. ? Beverages: Water. Red wine. Herbal tea. ? Fats and oils: Extra virgin olive oil. Avocado oil. Grape seed oil. ? Sweets and desserts: [place] yogurt with honey. Baked apples. Poached pears. Trail mix. ? Seasoning and other foods: Basil. Cilantro. Coriander. Cumin. Mint. Parsley. Sage. Rosemary. Tarragon. Garlic. Oregano. Thyme. Pepper. Balsalmic vinegar. Tahini. Hummus. Tomato sauce. Olives. Mushrooms. ? Limit these ? Grains: Prepackaged pasta or rice dishes. Prepackaged cereal with added sugar. ? Vegetables: Deep fried potatoes (french fries). ? Fruits: Fruit canned in syrup. ? Meats and other protein foods: Beef. Pork. Lamb. Poultry with skin. Hot dogs. [place] Salines. ?  ? Dairy: Ice cream. Sour cream. Whole milk. ? Beverages: Juice. Sugar-sweetened soft drinks. Beer. Liquor and spirits. ? Fats and oils: Butter. Canola oil. Vegetable oil. Beef fat (tallow). Lard. ? Sweets and desserts: Cookies. Cakes. Pies. Candy. ? Seasoning and other foods: Mayonnaise. Premade sauces and marinades. ? The items listed may not be a complete list. Talk with your dietitian about what dietary choices are right for you. Summary  The Mediterranean diet includes both food and lifestyle choices.  Eat a variety of fresh fruits and vegetables, beans, nuts, seeds, and whole grains.  Limit the amount of red meat and sweets that you eat.  Talk with your health care provider about whether it is safe for you to drink red wine in moderation. This means 1 glass a day for nonpregnant women and 2 glasses a day for men. A glass of wine equals 5 oz (150 mL). This information is not intended to replace advice given to you by your health care provider.  Make sure you discuss any questions you have with your health care provider. Document Released: 11/13/2015 Document Revised: 12/16/2015 Document Reviewed: 11/13/2015 Elsevier Interactive Patient Education  2018 [place] to check blood pressure and heart rate and if consistently <110/60- please call clinic and we will reduce Amlodipine dosage. EKG- normal. Follow-up in 3 months for complete physical. NICE TO SEE YOU!

## 2017-09-08 NOTE — Assessment & Plan Note (Signed)
EKG today- NSR Denies current cardiac sx's

## 2017-09-08 NOTE — Progress Notes (Signed)
Subjective:    Patient ID: Jordan Evans, male    DOB: 1950-04-20, 67 y.o.   MRN: [id number]  HPI :  [name] is here for hospital f/u: He was seen in [facility] 08/27/17 for N/V/D/fever- due to irregular HR and tachycardia UC referred him to ED Labs and EKG unremarkable. He denies any acute sx's of distress/pain  He continues to ride stationary bike 30-60 mins daily. He drinks >90 oz water/day He follows heart healthy diet and abstains from tobacco use He has upcoming appt with Urologist in 2 weeks- he reports intermittent dysuria and hx of renal calculi Home BP readings: SBP- 100-120s  DBP-60-80s HR 50-80s  Patient Care Team    Relationship Specialty Notifications Start End  Esaw Grandchild, NP PCP - General Family Medicine  08/31/16     Patient Active Problem List   Diagnosis Date Noted  . Irregular heartbeat 09/08/2017  . Encounter for Medicare annual wellness exam 08/25/2017  . Acute bronchitis 07/06/2017  . Screening for AAA (abdominal aortic aneurysm) 06/16/2017  . Hypothyroidism 06/16/2017  . Dizziness 03/17/2017  . Vasovagal syncope 03/17/2017  . Yeast infection 12/14/2016  . H/O multiple pulmonary nodules 12/14/2016  . Health care maintenance 08/31/2016  . Kidney calculi 08/31/2016  . Hernia of abdominal cavity 08/31/2016  . HTN (hypertension) 08/31/2016  . Hyperlipidemia 08/31/2016     Past Medical History:  Diagnosis Date  . History of kidney stones   . Hyperlipidemia   . Hypertension   . Hypothyroidism   . Syncope   . Thyroid disease      Past Surgical History:  Procedure Laterality Date  . EXTRACORPOREAL SHOCK WAVE LITHOTRIPSY Right 01/24/2017   Procedure: EXTRACORPOREAL SHOCK WAVE LITHOTRIPSY (ESWL);  Surgeon: Nickie Retort, MD;  Location: [facility];  Service: Urology;  Laterality: Right;  . HERNIA REPAIR     umbilical hernia- [id number]  . LITHOTRIPSY    . ROTATOR CUFF REPAIR Right   . URETHRAL DILATION     x5   . VASECTOMY       Family History   Problem Relation Age of Onset  . Cancer Mother        ovarian  . Hyperlipidemia Mother   . Hypertension Mother   . Heart attack Father   . Hyperlipidemia Father   . Hypertension Father   . Hyperlipidemia Sister   . Hypertension Sister   . Cancer Brother        lymph  . Hyperlipidemia Brother   . Hypertension Brother   . Diabetes Paternal Grandmother   . Hyperlipidemia Brother   . Hypertension Brother   . Hyperlipidemia Brother   . Hypertension Brother   . Hyperlipidemia Brother   . Hypertension Brother      Social History   Substance and Sexual Activity  Drug Use No     Social History   Substance and Sexual Activity  Alcohol Use Yes   Comment: rarely     Social History   Tobacco Use  Smoking Status Never Smoker  Smokeless Tobacco Never Used     Outpatient Encounter Medications as of 09/08/2017  Medication Sig  . amLODipine (NORVASC) 5 MG tablet Take 1 tablet (5 mg total) by mouth daily.  Marland Kitchen atorvastatin (LIPITOR) 20 MG tablet TAKE 1 TABLET BY MOUTH EVERY DAY  . levothyroxine (SYNTHROID, LEVOTHROID) 75 MCG tablet Take 1 tablet (75 mcg total) by mouth daily before breakfast.  . triamcinolone ointment (KENALOG) 0.1 % Apply 1 application topically as  needed.   No facility-administered encounter medications on file as of 09/08/2017.     Allergies: Percocet [oxycodone-acetaminophen]  Body mass index is 28.58 kg/m.  Blood pressure 104/64, pulse 69, height 5\' 10"  (1.778 m), weight 199 lb 3.2 oz (90.4 kg), SpO2 96 %.  Review of Systems  Constitutional: Positive for fatigue. Negative for activity change, appetite change, chills, diaphoresis, fever and unexpected weight change.  HENT: Negative for congestion.   Eyes: Negative for visual disturbance.  Respiratory: Negative for cough, chest tightness, shortness of breath, wheezing and stridor.   Cardiovascular: Negative for chest pain, palpitations and leg swelling.  Gastrointestinal: Negative for abdominal  distention, abdominal pain, blood in stool, constipation, diarrhea, nausea and vomiting.  Endocrine: Negative for cold intolerance, heat intolerance, polydipsia, polyphagia and polyuria.  Genitourinary: Negative for difficulty urinating and flank pain.  Musculoskeletal: Negative for arthralgias, back pain, gait problem, joint swelling, myalgias, neck pain and neck stiffness.  Skin: Negative for color change, pallor, rash and wound.  Neurological: Negative for dizziness and headaches.  Hematological: Does not bruise/bleed easily.  Psychiatric/Behavioral: Negative for confusion, decreased concentration, dysphoric mood, hallucinations, self-injury, sleep disturbance and suicidal ideas. The patient is not nervous/anxious and is not hyperactive.        Objective:   Physical Exam  Constitutional: He is oriented to person, place, and time. He appears well-developed and well-nourished. No distress.  HENT:  Head: Normocephalic and atraumatic.  Right Ear: External ear normal.  Left Ear: External ear normal.  Mouth/Throat: Oropharynx is clear and moist.  Eyes: Pupils are equal, round, and reactive to light. Conjunctivae and EOM are normal.  Cardiovascular: Normal rate, regular rhythm, normal heart sounds and intact distal pulses.  No murmur heard. Pulmonary/Chest: Effort normal and breath sounds normal. No stridor. No respiratory distress. He has no wheezes. He has no rales. He exhibits no tenderness.  Neurological: He is alert and oriented to person, place, and time.  Skin: Skin is warm and dry. Capillary refill takes less than 2 seconds. No rash noted. He is not diaphoretic. No erythema. No pallor.  Psychiatric: He has a normal mood and affect. His behavior is normal. Judgment and thought content normal.  Nursing note and vitals reviewed.     Assessment & Plan:   1. Hypertension, unspecified type   2. Irregular heartbeat   3. Health care maintenance     HTN (hypertension) BP 104/64, HR  69 Currently on amlodipine 5mg   Continue to check blood pressure and heart rate and if consistently <110/60- please call clinic and we will reduce Amlodipine dosage. EKG- normal. Follow-up in 3 months for complete physical.  Health care maintenance Follow-up in 3 months for complete physical.  Irregular heartbeat EKG today- NSR Denies current cardiac sx's    FOLLOW-UP:  Return in about 3 months (around 12/09/2017) for CPE.

## 2017-09-08 NOTE — Assessment & Plan Note (Signed)
Follow-up in 3 months for complete physical.

## 2017-09-08 NOTE — Assessment & Plan Note (Signed)
BP 104/64, HR 69 Currently on amlodipine 5mg   Continue to check blood pressure and heart rate and if consistently <110/60- please call clinic and we will reduce Amlodipine dosage. EKG- normal. Follow-up in 3 months for complete physical.

## 2017-09-12 ENCOUNTER — Other Ambulatory Visit: Payer: Self-pay | Admitting: Adult Health

## 2017-09-15 ENCOUNTER — Encounter: Payer: Self-pay | Admitting: Adult Health

## 2017-09-19 DIAGNOSIS — Z87442 Personal history of urinary calculi: Secondary | ICD-10-CM | POA: Diagnosis not present

## 2017-09-19 DIAGNOSIS — N481 Balanitis: Secondary | ICD-10-CM | POA: Diagnosis not present

## 2017-10-18 ENCOUNTER — Other Ambulatory Visit: Payer: Self-pay | Admitting: Adult Health

## 2017-10-18 DIAGNOSIS — M25519 Pain in unspecified shoulder: Secondary | ICD-10-CM

## 2017-10-26 ENCOUNTER — Ambulatory Visit (INDEPENDENT_AMBULATORY_CARE_PROVIDER_SITE_OTHER): Payer: Medicare Other | Admitting: Orthopaedic Surgery

## 2017-10-26 ENCOUNTER — Encounter (INDEPENDENT_AMBULATORY_CARE_PROVIDER_SITE_OTHER): Payer: Self-pay | Admitting: Orthopaedic Surgery

## 2017-10-26 ENCOUNTER — Ambulatory Visit (INDEPENDENT_AMBULATORY_CARE_PROVIDER_SITE_OTHER): Payer: Medicare Other

## 2017-10-26 DIAGNOSIS — G8929 Other chronic pain: Secondary | ICD-10-CM | POA: Diagnosis not present

## 2017-10-26 DIAGNOSIS — M25512 Pain in left shoulder: Secondary | ICD-10-CM

## 2017-10-26 MED ORDER — METHYLPREDNISOLONE ACETATE 40 MG/ML IJ SUSP
40.0000 mg | INTRAMUSCULAR | Status: AC | PRN
  Administered 2017-10-26: 40 mg via INTRA_ARTICULAR

## 2017-10-26 MED ORDER — LIDOCAINE HCL 1 % IJ SOLN
3.0000 mL | INTRAMUSCULAR | Status: AC | PRN
  Administered 2017-10-26: 3 mL

## 2017-10-26 NOTE — Progress Notes (Signed)
Office Visit Note   Patient: Jordan Evans           Date of Birth: 1950-10-29           MRN: [id number] Visit Date: 10/26/2017              Requested by: Esaw Grandchild, NP [place], [place] [address] PCP: Esaw Grandchild, NP   Assessment & Plan: Visit Diagnoses:  1. Chronic left shoulder pain     Plan: He will follow-up in 2 weeks check his response to the injection however if he is doing well he can cancel the appointment.  Did review with him shoulder exercises and he has Thera-Band which he can do the exercises at home with.  Questions were encouraged and answered at length.  He did have diminished pain after the injection of the left shoulder today.  Follow-Up Instructions: Return in about 2 weeks (around 11/09/2017).   Orders:  Orders Placed This Encounter  Procedures  . Large Joint Inj  . XR Shoulder Left   No orders of the defined types were placed in this encounter.     Procedures: Large Joint Inj on 10/26/2017 10:54 AM Indications: pain Details: 22 G 1.5 in needle, superior approach  Arthrogram: No  Medications: 3 mL lidocaine 1 %; 40 mg methylPREDNISolone acetate 40 MG/ML Outcome: tolerated well, no immediate complications Procedure, treatment alternatives, risks and benefits explained, specific risks discussed. Consent was given by the patient. Immediately prior to procedure a time out was called to verify the correct patient, procedure, equipment, support staff and site/side marked as required. Patient was prepped and draped in the usual sterile fashion.       Clinical Data: No additional findings.   Subjective: Chief Complaint  Patient presents with  . Left Shoulder - Pain    HPI Jordan Evans is a 67 year old male comes in today for left shoulder pain for the past 4 months.  He was lifting some lumber at [place] states that he reports for about 12 feet length he was lifting the chest level and needle level.  He developed pain in  his left arm.  Had some occasional numbness and tingling to his fingertips but this is only been on 3 occasions.  Most of his pain is a proximal left humerus region.  He denies any neck pain.  Pain does radiate down to his elbow at times.  He has waking pain.  He is seen no real improvement since the injury.  Has had no treatment for this.  Denies any bruising about the arm at the time of injury. Review of Systems Please see HPI otherwise negative  Objective: Vital Signs: There were no vitals taken for this visit.  Physical Exam  Constitutional: He is oriented to person, place, and time. He appears well-developed and well-nourished. No distress.  Pulmonary/Chest: Effort normal.  Neurological: He is alert and oriented to person, place, and time.  Skin: He is not diaphoretic.  Psychiatric: He has a normal mood and affect.    Ortho Exam Bilateral shoulders he has 5 out of 5 strength with external and internal rotation against resistance.  Negative empty can test bilaterally liftoff test is negative bilaterally.  Positive impingement on the left negative on the right.  Negative crossover test.  Nontender about the left shoulder with palpation.  As 5 out of 5 strength in the biceps and there is no deformity of the biceps bilaterally.  Nontender about the  cervical spine good range of motion cervical spine without pain.  Negative Spurling's.  Nontender over the medial borders of the scapulas bilaterally andthe trapezius regions bilaterally are nontender to palpation. Specialty Comments:  No specialty comments available.  Imaging: Xr Shoulder Left  Result Date: 10/26/2017 3 views left shoulder: Shoulders well located.  Glenohumeral joint is well-maintained.  Y-view shows the subacromial space to be well-maintained.  Mild to moderate acromioclavicular joint changes.  No bony abnormalities otherwise.    PMFS History: Patient Active Problem List   Diagnosis Date Noted  . Irregular heartbeat  09/08/2017  . Encounter for Medicare annual wellness exam 08/25/2017  . Acute bronchitis 07/06/2017  . Screening for AAA (abdominal aortic aneurysm) 06/16/2017  . Hypothyroidism 06/16/2017  . Dizziness 03/17/2017  . Vasovagal syncope 03/17/2017  . Yeast infection 12/14/2016  . H/O multiple pulmonary nodules 12/14/2016  . Health care maintenance 08/31/2016  . Kidney calculi 08/31/2016  . Hernia of abdominal cavity 08/31/2016  . HTN (hypertension) 08/31/2016  . Hyperlipidemia 08/31/2016   Past Medical History:  Diagnosis Date  . History of kidney stones   . Hyperlipidemia   . Hypertension   . Hypothyroidism   . Syncope   . Thyroid disease     Family History  Problem Relation Age of Onset  . Cancer Mother        ovarian  . Hyperlipidemia Mother   . Hypertension Mother   . Heart attack Father   . Hyperlipidemia Father   . Hypertension Father   . Hyperlipidemia Sister   . Hypertension Sister   . Cancer Brother        lymph  . Hyperlipidemia Brother   . Hypertension Brother   . Diabetes Paternal Grandmother   . Hyperlipidemia Brother   . Hypertension Brother   . Hyperlipidemia Brother   . Hypertension Brother   . Hyperlipidemia Brother   . Hypertension Brother     Past Surgical History:  Procedure Laterality Date  . EXTRACORPOREAL SHOCK WAVE LITHOTRIPSY Right 01/24/2017   Procedure: EXTRACORPOREAL SHOCK WAVE LITHOTRIPSY (ESWL);  Surgeon: Nickie Retort, MD;  Location: [facility];  Service: Urology;  Laterality: Right;  . HERNIA REPAIR     umbilical hernia- [phone]  . LITHOTRIPSY    . ROTATOR CUFF REPAIR Right   . URETHRAL DILATION     x5   . VASECTOMY     Social History   Occupational History  . Not on file  Tobacco Use  . Smoking status: Never Smoker  . Smokeless tobacco: Never Used  Substance and Sexual Activity  . Alcohol use: Yes    Comment: rarely  . Drug use: No  . Sexual activity: Yes    Birth control/protection: Surgical

## 2017-11-10 ENCOUNTER — Other Ambulatory Visit: Payer: Self-pay

## 2017-11-10 ENCOUNTER — Other Ambulatory Visit: Payer: Self-pay | Admitting: Adult Health

## 2017-11-10 MED ORDER — AMLODIPINE BESYLATE 5 MG PO TABS: 5.0000 mg | ORAL_TABLET | Freq: Every day | ORAL | 0 refills | Status: DC

## 2017-11-10 MED ORDER — LEVOTHYROXINE SODIUM 75 MCG PO TABS: ORAL_TABLET | ORAL | 0 refills | Status: DC

## 2017-11-11 ENCOUNTER — Other Ambulatory Visit: Payer: Self-pay | Admitting: Adult Health

## 2017-11-16 ENCOUNTER — Encounter (INDEPENDENT_AMBULATORY_CARE_PROVIDER_SITE_OTHER): Payer: Self-pay | Admitting: Orthopaedic Surgery

## 2017-11-16 ENCOUNTER — Ambulatory Visit (INDEPENDENT_AMBULATORY_CARE_PROVIDER_SITE_OTHER): Payer: Medicare Other | Admitting: Orthopaedic Surgery

## 2017-11-16 ENCOUNTER — Other Ambulatory Visit (INDEPENDENT_AMBULATORY_CARE_PROVIDER_SITE_OTHER): Payer: Self-pay

## 2017-11-16 DIAGNOSIS — M25512 Pain in left shoulder: Principal | ICD-10-CM

## 2017-11-16 DIAGNOSIS — G8929 Other chronic pain: Secondary | ICD-10-CM

## 2017-11-16 NOTE — Progress Notes (Signed)
The patient is continuing to experience significant left shoulder pain after lifting injury.  He is tried activity modification as well as taking anti-inflammatories.  He has had a subacromial injection in the left shoulder as well.  This did temporize his pain but he still experiencing significant left shoulder weakness and continued pain again with left shoulder.  He has a history of a previous right rotator cuff tear which is a full-thickness tear that required a repair and he did quite well with this.  On examination of his left shoulder today he is slightly using his deltoids to abduct his shoulder.  There is significant pain in the rotator interval of the left shoulder as well.  He has stiffness and pain with internal rotation and abduction.  His liftoff is negative.  At this point I do suspect that he has a least a partial full-thickness rotator cuff tear.  Given the fact that the left shoulder subacromial injection did help for a while but also given the fact that he has weakness in the shoulder and deficits on his clinical exam, I do feel it is medically necessary and warranted to obtain an MRI of the left shoulder at this point to rule out a rotator cuff tear.  He is agreeable to this as well.  All questions concerns were answered and addressed.  We will see him back in 2 weeks after he has had an MRI of his left shoulder to rule out a rotator cuff tear.

## 2017-11-25 ENCOUNTER — Telehealth (INDEPENDENT_AMBULATORY_CARE_PROVIDER_SITE_OTHER): Payer: Self-pay | Admitting: Orthopaedic Surgery

## 2017-11-25 NOTE — Telephone Encounter (Signed)
Patients wife called, said he has an MRI tomorrow at 64. Patient needs a rx written for valium in order to have the MRI done. He uses [facility] on [address], please advise # [phone]

## 2017-11-25 NOTE — Telephone Encounter (Signed)
Called into pharmacy

## 2017-11-26 ENCOUNTER — Ambulatory Visit
Admission: RE | Admit: 2017-11-26 | Discharge: 2017-11-26 | Disposition: A | Discharge status: Home / Self Care | Payer: Medicare Other | Source: Ambulatory Visit | Attending: Orthopaedic Surgery | Admitting: Orthopaedic Surgery

## 2017-11-26 DIAGNOSIS — M75102 Unspecified rotator cuff tear or rupture of left shoulder, not specified as traumatic: Secondary | ICD-10-CM | POA: Diagnosis not present

## 2017-11-26 DIAGNOSIS — G8929 Other chronic pain: Secondary | ICD-10-CM

## 2017-11-26 DIAGNOSIS — M25512 Pain in left shoulder: Principal | ICD-10-CM

## 2017-11-30 ENCOUNTER — Encounter (INDEPENDENT_AMBULATORY_CARE_PROVIDER_SITE_OTHER): Payer: Self-pay | Admitting: Orthopaedic Surgery

## 2017-11-30 ENCOUNTER — Ambulatory Visit (INDEPENDENT_AMBULATORY_CARE_PROVIDER_SITE_OTHER): Payer: Medicare Other | Admitting: Orthopaedic Surgery

## 2017-11-30 DIAGNOSIS — M75121 Complete rotator cuff tear or rupture of right shoulder, not specified as traumatic: Secondary | ICD-10-CM | POA: Diagnosis not present

## 2017-11-30 DIAGNOSIS — M7541 Impingement syndrome of right shoulder: Secondary | ICD-10-CM | POA: Diagnosis not present

## 2017-11-30 NOTE — Progress Notes (Signed)
Patient is well-known to me.  He is following up after having an MRI of his left shoulder due to continued pain and weakness.  He actually has a remote history of a right shoulder arthroscopic rotator cuff repair done in [place].  Is been working at home and ablations and having a 38-month history of worsening left shoulder pain and failed conservative treatment measures.  On exam his shoulder moves pretty fluidly but it does have pain and some weakness of the supraspinatus on abduction.  He does show positive signs of impingement.  MRI is reviewed with him and his wife.  He does have a full-thickness rotator cuff tear of the supra space insertion but no retraction and no muscle atrophy.  He is a type II acromion with a subacromial bone spur.  He has thickening of the rotator cuff itself in terms of tendinosis.  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  The glenohumeral joint and biceps tendon and labrum all appear normal.  At this point we are recommending an arthroscopic intervention of his left shoulder.  Given the success of surgery on his right shoulder he does wish to proceed with this on his left shoulder.  He understands fully the risk and benefits of the surgery with intraoperative and postoperative course were involved and all these were explained to him in detail showing a shoulder model.  He would like to have surgery as an outpatient toward the end of September.  We will work on getting that scheduled.  All question concerns were answered and addressed.  We will see him back in 1 week postoperative.

## 2017-12-09 ENCOUNTER — Other Ambulatory Visit: Payer: Self-pay | Admitting: Adult Health

## 2017-12-12 NOTE — Progress Notes (Signed)
Subjective:    Patient ID: Jordan Evans, male    DOB: [birth year], 67 y.o.   MRN: [id number]  HPI:  Jordan Evans is here for CPE He reports medication compliance, denies SE He has been riding stationary bike >56miles 5 days/week He has sig reduced saturated fat/sugar/CHO He has lost >13 lbs in 3 months Home BP readings: SBP 100-130, mean 110 DBP 60-80, mean 70 HR 50s-60  L shoulder surgery scheduled in 2 weeks to address torn rotator cuff and bone spurs. He continues to abstain from tobacco and seldom drinks ETOH  Healthcare Maintenance: Colonoscopy-UTD, due 2028, provided stool cards. Immunizations-decined Zoster and Pneumococcal  LDCT- No smoking hx AAA Screening-completed 06/2017- aortic atherosclerosis w/o aneurysm.  Patient Care Team    Relationship Specialty Notifications Start End  Esaw Grandchild, NP PCP - General Family Medicine  08/31/16     Patient Active Problem List   Diagnosis Date Noted  . BMI 26.0-26.9,adult 12/13/2017  . Irregular heartbeat 09/08/2017  . Encounter for screening fecal occult blood testing 08/25/2017  . Acute bronchitis 07/06/2017  . Screening for AAA (abdominal aortic aneurysm) 06/16/2017  . Hypothyroidism 06/16/2017  . Dizziness 03/17/2017  . Vasovagal syncope 03/17/2017  . Yeast infection 12/14/2016  . H/O multiple pulmonary nodules 12/14/2016  . Health care maintenance 08/31/2016  . Kidney calculi 08/31/2016  . Hernia of abdominal cavity 08/31/2016  . HTN (hypertension) 08/31/2016  . Hyperlipidemia 08/31/2016     Past Medical History:  Diagnosis Date  . History of kidney stones   . Hyperlipidemia   . Hypertension   . Hypothyroidism   . Syncope   . Thyroid disease      Past Surgical History:  Procedure Laterality Date  . EXTRACORPOREAL SHOCK WAVE LITHOTRIPSY Right 01/24/2017   Procedure: EXTRACORPOREAL SHOCK WAVE LITHOTRIPSY (ESWL);  Surgeon: Nickie Retort, MD;  Location: [facility];  Service: Urology;  Laterality: Right;   . HERNIA REPAIR     umbilical hernia- [phone]  . LITHOTRIPSY    . ROTATOR CUFF REPAIR Right   . URETHRAL DILATION     x5   . VASECTOMY       Family History  Problem Relation Age of Onset  . Cancer Mother        ovarian  . Hyperlipidemia Mother   . Hypertension Mother   . Heart attack Father   . Hyperlipidemia Father   . Hypertension Father   . Hyperlipidemia Sister   . Hypertension Sister   . Cancer Brother        lymph  . Hyperlipidemia Brother   . Hypertension Brother   . Diabetes Paternal Grandmother   . Hyperlipidemia Brother   . Hypertension Brother   . Hyperlipidemia Brother   . Hypertension Brother   . Hyperlipidemia Brother   . Hypertension Brother      Social History   Substance and Sexual Activity  Drug Use No     Social History   Substance and Sexual Activity  Alcohol Use Yes   Comment: rarely     Social History   Tobacco Use  Smoking Status Never Smoker  Smokeless Tobacco Never Used     Outpatient Encounter Medications as of 12/13/2017  Medication Sig  . amLODipine (NORVASC) 2.5 MG tablet 1 tablet daily  . atorvastatin (LIPITOR) 20 MG tablet TAKE 1 TABLET BY MOUTH EVERY DAY  . levothyroxine (SYNTHROID, LEVOTHROID) 75 MCG tablet TAKE 1 TABLET BY MOUTH DAILY BEFORE BREAKFAST.  Marland Kitchen triamcinolone ointment (KENALOG) 0.1 %  Apply 1 application topically as needed.  . [DISCONTINUED] amLODipine (NORVASC) 5 MG tablet Take 1 tablet (5 mg total) by mouth daily.   No facility-administered encounter medications on file as of 12/13/2017.     Allergies: Percocet [oxycodone-acetaminophen]  Body mass index is 26.72 kg/m.  Blood pressure 113/66, pulse (!) 58, height 5\' 10"  (1.778 m), weight 186 lb 3.2 oz (84.5 kg), SpO2 98 %.   Review of Systems  Constitutional: Negative for activity change, appetite change, chills, diaphoresis, fatigue, fever and unexpected weight change.  Eyes: Negative for visual disturbance.  Respiratory: Negative for cough,  chest tightness, shortness of breath, wheezing and stridor.   Cardiovascular: Negative for chest pain, palpitations and leg swelling.  Gastrointestinal: Negative for abdominal distention, abdominal pain, blood in stool, constipation, diarrhea and nausea.  Endocrine: Negative for cold intolerance, heat intolerance, polydipsia, polyphagia and polyuria.  Genitourinary: Negative for difficulty urinating and flank pain.  Musculoskeletal: Positive for arthralgias and myalgias. Negative for back pain, gait problem, joint swelling, neck pain and neck stiffness.  Skin: Negative for color change, pallor, rash and wound.  Neurological: Negative for dizziness and headaches.  Hematological: Does not bruise/bleed easily.  Psychiatric/Behavioral: Negative for confusion, decreased concentration, dysphoric mood, hallucinations, self-injury, sleep disturbance and suicidal ideas. The patient is not nervous/anxious and is not hyperactive.        Objective:   Physical Exam  Constitutional: He is oriented to person, place, and time. He appears well-developed and well-nourished. No distress.  HENT:  Head: Normocephalic and atraumatic.  Right Ear: External ear normal. Tympanic membrane is not erythematous and not bulging. No decreased hearing is noted.  Left Ear: External ear normal. Tympanic membrane is not erythematous and not bulging. No decreased hearing is noted.  Nose: Nose normal. No mucosal edema or rhinorrhea. Right sinus exhibits no maxillary sinus tenderness and no frontal sinus tenderness. Left sinus exhibits no maxillary sinus tenderness and no frontal sinus tenderness.  Mouth/Throat: Uvula is midline, oropharynx is clear and moist and mucous membranes are normal. Tonsils are 0 on the right. Tonsils are 0 on the left.  Eyes: Pupils are equal, round, and reactive to light. Conjunctivae and EOM are normal.  Neck: Normal range of motion. Neck supple.  Cardiovascular: Normal rate, regular rhythm, normal  heart sounds and intact distal pulses.  No murmur heard. Pulmonary/Chest: Effort normal and breath sounds normal. No stridor. No respiratory distress. He has no wheezes. He has no rales. He exhibits no tenderness.  Abdominal: Soft. Bowel sounds are normal. He exhibits no distension and no mass. There is no tenderness. There is no rigidity, no rebound, no guarding and no CVA tenderness. No hernia.  Umbilical mass will appear when sitting up/using abdominal muscles. No pain, mass is reducible   Genitourinary: Rectum normal and prostate normal. Rectal exam shows guaiac negative stool.  Genitourinary Comments: Chaperone present during examination.  Musculoskeletal: He exhibits tenderness.       Right shoulder: Normal.       Left shoulder: He exhibits decreased range of motion and tenderness.       Left elbow: Normal.  Lymphadenopathy:    He has no cervical adenopathy.  Neurological: He is alert and oriented to person, place, and time. Coordination normal.  Skin: Skin is warm and dry. Capillary refill takes less than 2 seconds. No rash noted. He is not diaphoretic. No erythema. No pallor.  Psychiatric: He has a normal mood and affect. His behavior is normal. Judgment and thought content normal.  Nursing note and vitals reviewed.     Assessment & Plan:   1. Hypothyroidism, unspecified type   2. Encounter for screening fecal occult blood testing   3. Hypertension, unspecified type   4. BMI 26.0-26.9,adult     HTN (hypertension) BP well below goal 113/66, HR 58 Home BP readings- SBP 100-130, mean 110 DBP 60-80, mean 70 HR mid 50s- mid 60s He has lost >13 lbs since June 2019 Reduced Amlodipine from 5mg  to 2.5mg  QD Continue to check HR/BP at home  Hypothyroidism 08/15/17 TSH 0.491 Currently on Levothyroxine 75 mcg QD TSH re-drawn today  BMI 26.0-26.9,adult Body mass index is 26.72 kg/m.  Currnet wt 186 He has lost > 13 lbs 3 months BP much improved Continue biking program and  heart healthy diet    FOLLOW-UP:  Return in about 6 months (around 06/13/2018) for Regular Follow Up, Fasting Labs.

## 2017-12-13 ENCOUNTER — Encounter: Payer: Self-pay | Admitting: Adult Health

## 2017-12-13 ENCOUNTER — Ambulatory Visit (INDEPENDENT_AMBULATORY_CARE_PROVIDER_SITE_OTHER): Payer: Medicare Other | Admitting: Adult Health

## 2017-12-13 VITALS — BP 113/66 | HR 58 | Ht 70.0 in | Wt 186.2 lb

## 2017-12-13 DIAGNOSIS — I10 Essential (primary) hypertension: Secondary | ICD-10-CM | POA: Diagnosis not present

## 2017-12-13 DIAGNOSIS — Z1211 Encounter for screening for malignant neoplasm of colon: Secondary | ICD-10-CM

## 2017-12-13 DIAGNOSIS — E039 Hypothyroidism, unspecified: Secondary | ICD-10-CM

## 2017-12-13 DIAGNOSIS — Z6826 Body mass index (BMI) 26.0-26.9, adult: Secondary | ICD-10-CM | POA: Diagnosis not present

## 2017-12-13 LAB — POC HEMOCCULT BLD/STL (OFFICE/1-CARD/DIAGNOSTIC): Fecal Occult Blood, POC: NEGATIVE

## 2017-12-13 MED ORDER — AMLODIPINE BESYLATE 2.5 MG PO TABS: ORAL_TABLET | ORAL | 2 refills | Status: DC

## 2017-12-13 NOTE — Patient Instructions (Addendum)

## 2017-12-13 NOTE — Assessment & Plan Note (Addendum)
BP well below goal 113/66, HR 58 Home BP readings- SBP 100-130, mean 110 DBP 60-80, mean 70 HR mid 50s- mid 60s He has lost >13 lbs since June 2019 Reduced Amlodipine from 5mg  to 2.5mg  QD Continue to check HR/BP at home

## 2017-12-13 NOTE — Assessment & Plan Note (Signed)
Body mass index is 26.72 kg/m.  Currnet wt 186 He has lost > 13 lbs 3 months BP much improved Continue biking program and heart healthy diet

## 2017-12-13 NOTE — Assessment & Plan Note (Signed)
08/15/17 TSH 0.491 Currently on Levothyroxine 75 mcg QD TSH re-drawn today

## 2017-12-14 LAB — TSH: TSH: 1.42 u[IU]/mL (ref 0.450–4.500)

## 2017-12-29 DIAGNOSIS — M7541 Impingement syndrome of right shoulder: Secondary | ICD-10-CM | POA: Diagnosis not present

## 2017-12-29 DIAGNOSIS — M7542 Impingement syndrome of left shoulder: Secondary | ICD-10-CM | POA: Diagnosis not present

## 2017-12-29 DIAGNOSIS — G8918 Other acute postprocedural pain: Secondary | ICD-10-CM | POA: Diagnosis not present

## 2017-12-29 DIAGNOSIS — M7522 Bicipital tendinitis, left shoulder: Secondary | ICD-10-CM | POA: Diagnosis not present

## 2017-12-29 DIAGNOSIS — M7552 Bursitis of left shoulder: Secondary | ICD-10-CM | POA: Diagnosis not present

## 2017-12-29 DIAGNOSIS — M19011 Primary osteoarthritis, right shoulder: Secondary | ICD-10-CM | POA: Diagnosis not present

## 2018-01-09 ENCOUNTER — Ambulatory Visit (INDEPENDENT_AMBULATORY_CARE_PROVIDER_SITE_OTHER): Payer: Medicare Other | Admitting: Orthopaedic Surgery

## 2018-01-09 ENCOUNTER — Encounter (INDEPENDENT_AMBULATORY_CARE_PROVIDER_SITE_OTHER): Payer: Self-pay | Admitting: Orthopaedic Surgery

## 2018-01-09 DIAGNOSIS — M7541 Impingement syndrome of right shoulder: Secondary | ICD-10-CM

## 2018-01-09 NOTE — Progress Notes (Signed)
Patient is at his first postoperative appointment status post a left shoulder subacromial decompression with debridement to treat impingement syndrome.  He is doing well overall.  The suture lines look good to remove the sutures.  There is no evidence of infection.  Shoulder is stiff postoperative.  I did offer him physical therapy but he wants to do this on his own.  He did rehab successfully a right shoulder after rotator cuff surgery.  We gave him a handout on exercises and Thera-Band.  I demonstrated some exercises to try as well.  He is not taking any pain medications and says he does not need them.  He will stop his sling.  We will see him back in 3 weeks for repeat exam of the left shoulder.  He understands if he is not progressing we may recommend formal physical therapy.

## 2018-01-30 ENCOUNTER — Ambulatory Visit (INDEPENDENT_AMBULATORY_CARE_PROVIDER_SITE_OTHER): Payer: Medicare Other | Admitting: Orthopaedic Surgery

## 2018-01-30 ENCOUNTER — Encounter (INDEPENDENT_AMBULATORY_CARE_PROVIDER_SITE_OTHER): Payer: Self-pay | Admitting: Orthopaedic Surgery

## 2018-01-30 DIAGNOSIS — M75121 Complete rotator cuff tear or rupture of right shoulder, not specified as traumatic: Secondary | ICD-10-CM | POA: Diagnosis not present

## 2018-01-30 DIAGNOSIS — M7541 Impingement syndrome of right shoulder: Secondary | ICD-10-CM | POA: Diagnosis not present

## 2018-01-30 MED ORDER — METHYLPREDNISOLONE ACETATE 40 MG/ML IJ SUSP
40.0000 mg | INTRAMUSCULAR | Status: AC | PRN
  Administered 2018-01-30: 40 mg via INTRA_ARTICULAR

## 2018-01-30 MED ORDER — LIDOCAINE HCL 1 % IJ SOLN
3.0000 mL | INTRAMUSCULAR | Status: AC | PRN
  Administered 2018-01-30: 3 mL

## 2018-01-30 NOTE — Progress Notes (Signed)
   Procedure Note  Patient: Jordan Evans             Date of Birth: 1950-10-07           MRN: [id number]             Visit Date: 01/30/2018  Procedures: Visit Diagnoses: Impingement syndrome of right shoulder  Nontraumatic complete tear of right rotator cuff  Large Joint Inj: L subacromial bursa on 01/30/2018 3:54 PM Indications: pain and diagnostic evaluation Details: 22 G 1.5 in needle  Arthrogram: No  Medications: 3 mL lidocaine 1 %; 40 mg methylPREDNISolone acetate 40 MG/ML Outcome: tolerated well, no immediate complications Procedure, treatment alternatives, risks and benefits explained, specific risks discussed. Consent was given by the patient. Immediately prior to procedure a time out was called to verify the correct patient, procedure, equipment, support staff and site/side marked as required. Patient was prepped and draped in the usual sterile fashion.    The patient is a 67 year old gentleman who is now 6 weeks status post a left shoulder arthroscopy with significant and extensive debridement due to impingement syndrome of the shoulder.  He says he is doing well but does not feel like his shoulder is much better.  Is been trying the best he can with exercises.  On exam his shoulder abduction and forward flexion certainly limited knee showing pain in the shoulder itself no significant swelling.  I have recommended steroid injection subacromial space of the shoulder to treat postoperative pain and inflammation.  He understands this and tolerated it well.  He absolutely needs formal physical therapy at this point on his left shoulder to increase the mobility of his shoulder and hopefully decrease his pain.  We will work on getting that set up.  We will see him back in a month to see how is doing overall.

## 2018-01-31 ENCOUNTER — Other Ambulatory Visit (INDEPENDENT_AMBULATORY_CARE_PROVIDER_SITE_OTHER): Payer: Self-pay

## 2018-01-31 DIAGNOSIS — G8929 Other chronic pain: Secondary | ICD-10-CM

## 2018-01-31 DIAGNOSIS — M25512 Pain in left shoulder: Principal | ICD-10-CM

## 2018-02-02 ENCOUNTER — Other Ambulatory Visit: Payer: Self-pay | Admitting: Adult Health

## 2018-02-16 ENCOUNTER — Other Ambulatory Visit: Payer: Self-pay

## 2018-02-16 ENCOUNTER — Ambulatory Visit: Payer: Medicare Other | Attending: Orthopaedic Surgery | Admitting: Physical Therapy

## 2018-02-16 ENCOUNTER — Encounter: Payer: Self-pay | Admitting: Physical Therapy

## 2018-02-16 DIAGNOSIS — M25512 Pain in left shoulder: Secondary | ICD-10-CM

## 2018-02-16 DIAGNOSIS — M6281 Muscle weakness (generalized): Secondary | ICD-10-CM | POA: Insufficient documentation

## 2018-02-16 DIAGNOSIS — M25612 Stiffness of left shoulder, not elsewhere classified: Secondary | ICD-10-CM | POA: Insufficient documentation

## 2018-02-16 NOTE — Therapy (Signed)
[place], [place], [address] Phone: [phone]   Fax:  [phone]  Physical Therapy Evaluation  Patient Details  Name: Jordan Evans MRN: [id number] Date of Birth: 04/16/1950 Referring Provider (PT): Jean Rosenthal, MD   Encounter Date: 02/16/2018  PT End of Session - 02/16/18 1949    Visit Number  1    Number of Visits  12    Date for PT Re-Evaluation  03/30/18    Authorization Type  Medicare-Add Kx at 15 visits    PT Start Time  1617    PT Stop Time  1700    PT Time Calculation (min)  43 min    Activity Tolerance  Patient tolerated treatment well    Behavior During Therapy  [facility] for tasks assessed/performed       Past Medical History:  Diagnosis Date  . History of kidney stones   . Hyperlipidemia   . Hypertension   . Hypothyroidism   . Syncope   . Thyroid disease     Past Surgical History:  Procedure Laterality Date  . EXTRACORPOREAL SHOCK WAVE LITHOTRIPSY Right 01/24/2017   Procedure: EXTRACORPOREAL SHOCK WAVE LITHOTRIPSY (ESWL);  Surgeon: Nickie Retort, MD;  Location: [facility];  Service: Urology;  Laterality: Right;  . HERNIA REPAIR     umbilical hernia- [id number]  . LITHOTRIPSY    . ROTATOR CUFF REPAIR Right   . URETHRAL DILATION     x5   . VASECTOMY      There were no vitals filed for this visit.   Subjective Assessment - 02/16/18 1615    Subjective  Pt. is a 67 y/o male s/p left shoulder arthroscopic surgery with history of pain secondary to impingement issues unresolved with conservative tx. measures. Pt. had initially wished to try exercises on his own but was recommended to follow up with formal therapy due to continued limitations.    Pertinent History  s/p left shoulder arthroscopic surgery, previous history right rotator cuff surgery    Limitations  Lifting;House hold activities   Reaching ADLs, dressing, bathing   How long can you sit comfortably?  no limitations     How long can you stand comfortably?  no limitations    How long can you walk comfortably?  no limitations    Diagnostic tests  MRI, X-rays    Patient Stated Goals  Improve left shoulder use/decrease pain    Currently in Pain?  Yes    Pain Score  5     Pain Location  Shoulder    Pain Orientation  Left    Pain Descriptors / Indicators  Sharp    Pain Type  Surgical pain    Pain Onset  More than a month ago    Pain Frequency  Intermittent    Aggravating Factors   reaching, activity    Pain Relieving Factors  rest    Effect of Pain on Daily Activities  limited tolerance/ability reaching ADLs and lifting activities         [facility] PT Assessment - 02/16/18 0001      Assessment   Medical Diagnosis  s/p left shoulder arthroscopic surgery-SAD, debridement (no repair)    Referring Provider (PT)  Jean Rosenthal, MD    Onset Date/Surgical Date  12/29/17    Hand Dominance  Right    Prior Therapy  none      Precautions   Precautions  None      Restrictions   Weight  Bearing Restrictions  No      Balance Screen   Has the patient fallen in the past 6 months  No      Comfort residence    Living Arrangements  Spouse/significant other      Prior Function   Level of Independence  Independent with basic ADLs      Cognition   Overall Cognitive Status  Within Functional Limits for tasks assessed      Observation/Other Assessments   Skin Integrity  left shoulder surgical/arthroscopic incision sites well-healed/no signs infection    Focus on Therapeutic Outcomes (FOTO)   54% limited      ROM / Strength   AROM / PROM / Strength  AROM;PROM;Strength      AROM   AROM Assessment Site  Shoulder    Right/Left Shoulder  Right;Left    Right Shoulder Flexion  160 Degrees    Right Shoulder ABduction  160 Degrees    Right Shoulder Internal Rotation  --   reach to T8   Right Shoulder External Rotation  60 Degrees   at 0 deg abd   Left Shoulder  Flexion  110 Degrees    Left Shoulder ABduction  80 Degrees    Left Shoulder Internal Rotation  --   reach to L1   Left Shoulder External Rotation  40 Degrees   at 0 deg abd     PROM   PROM Assessment Site  Shoulder    Right/Left Shoulder  Left    Left Shoulder Flexion  130 Degrees    Left Shoulder ABduction  120 Degrees    Left Shoulder Internal Rotation  60 Degrees    Left Shoulder External Rotation  40 Degrees      Strength   Strength Assessment Site  Shoulder    Right/Left Shoulder  Right;Left    Right Shoulder Flexion  5/5    Right Shoulder ABduction  5/5    Right Shoulder Internal Rotation  5/5    Right Shoulder External Rotation  5/5    Left Shoulder Flexion  4/5    Left Shoulder ABduction  3-/5    Left Shoulder Internal Rotation  5/5    Left Shoulder External Rotation  4+/5                Objective measurements completed on examination: See above findings.      [facility] Adult PT Treatment/Exercise - 02/16/18 0001      Exercises   Exercises  Shoulder      Shoulder Exercises: Supine   External Rotation  AAROM;Left;10 reps   supine wand ER stretch   Flexion  AAROM;Both;10 reps   supine wand flexion stretch     Shoulder Exercises: Sidelying   ABduction  --   Left scaption in right sidelying x 10     Shoulder Exercises: Standing   External Rotation  Strengthening;Left;10 reps    Theraband Level (Shoulder External Rotation)  Level 3 (Green)    External Rotation Limitations  Tried yellow and red first but pt. reported too light    Internal Rotation  Strengthening;Left;10 reps    Theraband Level (Shoulder Internal Rotation)  Level 3 (Green)      Manual Therapy   Manual Therapy  Joint mobilization;Soft tissue mobilization;Passive ROM    Joint Mobilization  gentle AP GH mobilization grade I-II    Soft tissue mobilization  Left subscapularis release for ER ROM    Passive ROM  Left shouldeer 4-way PROM             PT Education - 02/16/18 1949     Education Details  HEP, POC    Person(s) Educated  Patient    Methods  Explanation;Demonstration;Handout    Comprehension  Verbalized understanding;Returned demonstration       PT Short Term Goals - 02/16/18 2000      PT SHORT TERM GOAL #1   Title  Independent with HEP    Time  3    Period  Weeks    Status  New      PT SHORT TERM GOAL #2   Title  Increasee left shoulder abduction AROM 10 deg or greater to improve ability use left UE for showering    Time  3    Period  Weeks    Status  New        PT Long Term Goals - 02/16/18 2000      PT LONG TERM GOAL #1   Title  Left shoulder strength grossly 5/5 to improve ability for lifting for chores, carrying grocery bags    Time  6    Period  Weeks    Status  New    Target Date  03/30/18      PT LONG TERM GOAL #2   Title  Left shoulder AROM grossly WFL to dresss and bathe with LUE use    Time  6    Period  Weeks    Status  New    Target Date  03/30/18      PT LONG TERM GOAL #3   Title  Improve FOTO outcome measure score 20% or greater    Baseline  54% limited    Time  6    Period  Weeks    Status  New    Target Date  03/30/18      PT LONG TERM GOAL #4   Title  Perform ADLs/IADLs including dressing, bathing with left shoulder pain 2/10 or less    Baseline  5/10 pain    Time  6    Period  Weeks    Status  New    Target Date  03/30/18             Plan - 02/16/18 1950    Clinical Impression Statement  Pt. presents with left shoulder pain, decreased ROM/stiffness, and muscle weakness 7 weeks s/p left shoulder arthroscopic surgery. Pt. would benefit from PT to address current associated functional limitations to improve LUE use for ADLs/IADLs.    History and Personal Factors relevant to plan of care:  time since surgery/previous independent efforts, history right rotator cuff surgery    Clinical Decision Making  Low    Rehab Potential  Good    Clinical Impairments Affecting Rehab Potential  No other significant  comorbidities, pt. motivated and expect will progress well with therapy    PT Frequency  2x / week    PT Duration  6 weeks    PT Treatment/Interventions  Passive range of motion;ADLs/Self Care Home Management;Cryotherapy;Electrical Stimulation;Moist Heat;Therapeutic activities;Therapeutic exercise;Patient/family education;Manual techniques;Dry needling;Taping;Vasopneumatic Device    PT Next Visit Plan  Left shoulder PROM/stretching, progress supine/sidelying flexion and abduction ROM to standing pending mechnics, STM and gentle joint mobs as needed, progress T-band exercises as tolerated-add rows, ext    PT Home Exercise Plan  supine wand flexion stretch vs. table slide, sidelying abduction, wand ER stretch, Theraband ER/IR    Consulted and Agree with  Plan of Care  Patient       Patient will benefit from skilled therapeutic intervention in order to improve the following deficits and impairments:  Pain, Impaired UE functional use, Decreased strength, Decreased range of motion, Decreased activity tolerance  Visit Diagnosis: Left shoulder pain, unspecified chronicity  Stiffness of left shoulder, not elsewhere classified  Muscle weakness (generalized)     Problem List Patient Active Problem List   Diagnosis Date Noted  . BMI 26.0-26.9,adult 12/13/2017  . Irregular heartbeat 09/08/2017  . Encounter for screening fecal occult blood testing 08/25/2017  . Acute bronchitis 07/06/2017  . Screening for AAA (abdominal aortic aneurysm) 06/16/2017  . Hypothyroidism 06/16/2017  . Dizziness 03/17/2017  . Vasovagal syncope 03/17/2017  . Yeast infection 12/14/2016  . H/O multiple pulmonary nodules 12/14/2016  . Health care maintenance 08/31/2016  . Kidney calculi 08/31/2016  . Hernia of abdominal cavity 08/31/2016  . HTN (hypertension) 08/31/2016  . Hyperlipidemia 08/31/2016    Beaulah Dinning, PT, DPT 02/16/18 8:06 PM  [facility] [address] [place], [place], [address] Phone: [phone]   Fax:  [phone]  Name: HUMZA TALLERICO MRN: [id number] Date of Birth: Sep 16, 1950

## 2018-02-17 ENCOUNTER — Other Ambulatory Visit: Payer: Self-pay

## 2018-02-17 MED ORDER — LEVOTHYROXINE SODIUM 75 MCG PO TABS: ORAL_TABLET | ORAL | 1 refills | Status: DC

## 2018-02-21 ENCOUNTER — Ambulatory Visit: Payer: Medicare Other | Admitting: Physical Therapy

## 2018-02-21 ENCOUNTER — Encounter: Payer: Self-pay | Admitting: Physical Therapy

## 2018-02-21 DIAGNOSIS — M25512 Pain in left shoulder: Secondary | ICD-10-CM | POA: Diagnosis not present

## 2018-02-21 DIAGNOSIS — M6281 Muscle weakness (generalized): Secondary | ICD-10-CM | POA: Diagnosis not present

## 2018-02-21 DIAGNOSIS — M25612 Stiffness of left shoulder, not elsewhere classified: Secondary | ICD-10-CM

## 2018-02-21 NOTE — Therapy (Signed)
[place], [place], [address] Phone: [phone]   Fax:  [phone]  Physical Therapy Treatment  Patient Details  Name: Jordan Evans MRN: [id number] Date of Birth: January 16, 1951 Referring Provider (PT): Jean Rosenthal, MD   Encounter Date: 02/21/2018  PT End of Session - 02/21/18 0910    Visit Number  2    Number of Visits  12    Date for PT Re-Evaluation  03/30/18    Authorization Type  Medicare-Add Kx at 15 visits    PT Start Time  0915    PT Stop Time  0959    PT Time Calculation (min)  44 min    Activity Tolerance  Patient tolerated treatment well    Behavior During Therapy  [facility] for tasks assessed/performed       Past Medical History:  Diagnosis Date  . History of kidney stones   . Hyperlipidemia   . Hypertension   . Hypothyroidism   . Syncope   . Thyroid disease     Past Surgical History:  Procedure Laterality Date  . EXTRACORPOREAL SHOCK WAVE LITHOTRIPSY Right 01/24/2017   Procedure: EXTRACORPOREAL SHOCK WAVE LITHOTRIPSY (ESWL);  Surgeon: Nickie Retort, MD;  Location: [facility];  Service: Urology;  Laterality: Right;  . HERNIA REPAIR     umbilical hernia- [phone]  . LITHOTRIPSY    . ROTATOR CUFF REPAIR Right   . URETHRAL DILATION     x5   . VASECTOMY      There were no vitals filed for this visit.  Subjective Assessment - 02/21/18 0911    Subjective  "Still having soreness with use up 5/10, doing the exercises every day"     Patient Stated Goals  Improve left shoulder use/decrease pain    Currently in Pain?  Yes    Pain Score  5     Pain Location  Shoulder    Pain Orientation  Left    Pain Type  Surgical pain    Pain Onset  More than a month ago    Pain Frequency  Intermittent                       [facility] Adult PT Treatment/Exercise - 02/21/18 0919      Shoulder Exercises: Supine   External Rotation  AAROM;Left;10 reps   holding end range x 5 sec  ea. with dowel rod   Flexion  AAROM;Both;10 reps   holding end range x 5 sec ea. with dowel rod     Shoulder Exercises: Seated   Row  Strengthening;15 reps;Theraband   x 2 sets   Theraband Level (Shoulder Row)  Level 3 (Green)    Other Seated Exercises  scapular retraction with ER 2 x 10 with green theraband   cues to increase distance between hands to reduce tension      Manual Therapy   Joint Mobilization  Grade 2 PA/AP an inferior mobs     Soft tissue mobilization  IASTM along the L anterior/ middle deltoid    Passive ROM  shoulder flexion/ abduction working into end ranges with grade 1 distraction oscillations to decrease pain             PT Education - 02/21/18 0943    Education Details  reviewed previously provided HEP and updated for Rows    Person(s) Educated  Patient    Methods  Explanation;Verbal cues    Comprehension  Verbalized understanding;Verbal cues  required       PT Short Term Goals - 02/16/18 2000      PT SHORT TERM GOAL #1   Title  Independent with HEP    Time  3    Period  Weeks    Status  New      PT SHORT TERM GOAL #2   Title  Increasee left shoulder abduction AROM 10 deg or greater to improve ability use left UE for showering    Time  3    Period  Weeks    Status  New        PT Long Term Goals - 02/16/18 2000      PT LONG TERM GOAL #1   Title  Left shoulder strength grossly 5/5 to improve ability for lifting for chores, carrying grocery bags    Time  6    Period  Weeks    Status  New    Target Date  03/30/18      PT LONG TERM GOAL #2   Title  Left shoulder AROM grossly WFL to dresss and bathe with LUE use    Time  6    Period  Weeks    Status  New    Target Date  03/30/18      PT LONG TERM GOAL #3   Title  Improve FOTO outcome measure score 20% or greater    Baseline  54% limited    Time  6    Period  Weeks    Status  New    Target Date  03/30/18      PT LONG TERM GOAL #4   Title  Perform ADLs/IADLs including dressing,  bathing with left shoulder pain 2/10 or less    Baseline  5/10 pain    Time  6    Period  Weeks    Status  New    Target Date  03/30/18            Plan - 02/21/18 0959    Clinical Impression Statement  pt reports consistency with HEP noting 5/10 pain mostly with activity and no pain at rest. cotnined shoulder mobs and PROM working into end ranges. reviewed HEP and cotninued strengthening which he performed well reporting no increase in pain. pt declined modalities end of session.     PT Next Visit Plan  Left shoulder PROM/ AAROM, progress supine/sidelying flexion and abduction ROM to standing pending mechnics, STM and gentle joint mobs as needed, progress T-band exercises as tolerated-add shoulder ext    PT Home Exercise Plan  supine wand flexion stretch vs. table slide, sidelying abduction, wand ER stretch, Theraband ER/IR, rows    Consulted and Agree with Plan of Care  Patient       Patient will benefit from skilled therapeutic intervention in order to improve the following deficits and impairments:  Pain, Impaired UE functional use, Decreased strength, Decreased range of motion, Decreased activity tolerance  Visit Diagnosis: Stiffness of left shoulder, not elsewhere classified  Muscle weakness (generalized)  Left shoulder pain, unspecified chronicity     Problem List Patient Active Problem List   Diagnosis Date Noted  . BMI 26.0-26.9,adult 12/13/2017  . Irregular heartbeat 09/08/2017  . Encounter for screening fecal occult blood testing 08/25/2017  . Acute bronchitis 07/06/2017  . Screening for AAA (abdominal aortic aneurysm) 06/16/2017  . Hypothyroidism 06/16/2017  . Dizziness 03/17/2017  . Vasovagal syncope 03/17/2017  . Yeast infection 12/14/2016  . H/O multiple pulmonary nodules 12/14/2016  .  Health care maintenance 08/31/2016  . Kidney calculi 08/31/2016  . Hernia of abdominal cavity 08/31/2016  . HTN (hypertension) 08/31/2016  . Hyperlipidemia 08/31/2016    Starr Lake PT, DPT, LAT, ATC  02/21/18  10:04 AM      [facility] [address] [place], [place], [address] Phone: [phone]   Fax:  [phone]  Name: Jordan Evans MRN: [id number] Date of Birth: September 26, 1950

## 2018-02-22 ENCOUNTER — Encounter: Payer: Self-pay | Admitting: Rehabilitative and Restorative Service Providers"

## 2018-02-22 ENCOUNTER — Ambulatory Visit: Payer: Medicare Other | Admitting: Adult Health

## 2018-02-22 ENCOUNTER — Ambulatory Visit: Payer: Medicare Other | Admitting: Rehabilitative and Restorative Service Providers"

## 2018-02-22 DIAGNOSIS — M6281 Muscle weakness (generalized): Secondary | ICD-10-CM

## 2018-02-22 DIAGNOSIS — M25512 Pain in left shoulder: Secondary | ICD-10-CM

## 2018-02-22 DIAGNOSIS — Z23 Encounter for immunization: Secondary | ICD-10-CM | POA: Diagnosis not present

## 2018-02-22 DIAGNOSIS — M25612 Stiffness of left shoulder, not elsewhere classified: Secondary | ICD-10-CM | POA: Diagnosis not present

## 2018-02-22 NOTE — Therapy (Signed)
[place], [place], [address] Phone: [phone]   Fax:  [phone]  Physical Therapy Treatment  Patient Details  Name: Jordan Evans MRN: [id number] Date of Birth: May 15, 1950 Referring Provider (PT): Jean Rosenthal, MD   Encounter Date: 02/22/2018  PT End of Session - 02/22/18 1623    Visit Number  3    Number of Visits  12    Date for PT Re-Evaluation  03/30/18    Authorization Type  Medicare-Add Kx at 15 visits    PT Start Time  1623    PT Stop Time  5427   heat post treatment    PT Time Calculation (min)  52 min    Activity Tolerance  Patient tolerated treatment well       Past Medical History:  Diagnosis Date  . History of kidney stones   . Hyperlipidemia   . Hypertension   . Hypothyroidism   . Syncope   . Thyroid disease     Past Surgical History:  Procedure Laterality Date  . EXTRACORPOREAL SHOCK WAVE LITHOTRIPSY Right 01/24/2017   Procedure: EXTRACORPOREAL SHOCK WAVE LITHOTRIPSY (ESWL);  Surgeon: Nickie Retort, MD;  Location: [facility];  Service: Urology;  Laterality: Right;  . HERNIA REPAIR     umbilical hernia- 0623  . LITHOTRIPSY    . ROTATOR CUFF REPAIR Right   . URETHRAL DILATION     x5   . VASECTOMY      There were no vitals filed for this visit.  Subjective Assessment - 02/22/18 1626    Subjective  Shoulder is feeling "allright" - doesn't hurt at all if he doesn't use it      Currently in Pain?  No/denies    Pain Score  0-No pain                       [facility] Adult PT Treatment/Exercise - 02/22/18 0001      Neuro Re-ed    Neuro Re-ed Details   workiing on posture and alignment engaging posterior shoulder girdle       Shoulder Exercises: Standing   Other Standing Exercises  scap squeeze 10 sec x 10 with noodle;       Shoulder Exercises: Pulleys   Flexion  --   10 sec hold x 10 reps    Scaption  --   10 sec hold x 10 reps      Shoulder Exercises: Therapy Ball   Flexion  Both;5 reps   rolling therapy ball forward on table    Other Therapy Ball Exercises  scapular depression pressing into large therapy ball while seated 5 sec hold x 10 reps       Shoulder Exercises: Stretch   Internal Rotation Stretch  5 reps   10 sec hold w/wand behind back    Internal Rotation Stretch Limitations  strap up back 10 sec x 5     External Rotation Stretch  5 reps;10 seconds   standing noodle along spine using stick   Wall Stretch - Flexion  3 reps;10 seconds   PT providing verbal and tactile cues for scapular position    Other Shoulder Stretches  1/2 doorway standing at wall shd ~ 60 deg abduction turning to strength into ER 20 sec x 3       Moist Heat Therapy   Number Minutes Moist Heat  10 Minutes    Moist Heat Location  Shoulder   Lt  Manual Therapy   Manual therapy comments  pt supine     Joint Mobilization  Grade 2 PA/AP an inferior mobs     Soft tissue mobilization  working through the upper trap; leveator; pecs; teres Lt shoulder     Passive ROM  Lt shoulder flexion; extension; abduction; ER  to end ranges with hold 3-4 reps each                PT Short Term Goals - 02/16/18 2000      PT SHORT TERM GOAL #1   Title  Independent with HEP    Time  3    Period  Weeks    Status  New      PT SHORT TERM GOAL #2   Title  Increasee left shoulder abduction AROM 10 deg or greater to improve ability use left UE for showering    Time  3    Period  Weeks    Status  New        PT Long Term Goals - 02/16/18 2000      PT LONG TERM GOAL #1   Title  Left shoulder strength grossly 5/5 to improve ability for lifting for chores, carrying grocery bags    Time  6    Period  Weeks    Status  New    Target Date  03/30/18      PT LONG TERM GOAL #2   Title  Left shoulder AROM grossly WFL to dresss and bathe with LUE use    Time  6    Period  Weeks    Status  New    Target Date  03/30/18      PT LONG TERM GOAL  #3   Title  Improve FOTO outcome measure score 20% or greater    Baseline  54% limited    Time  6    Period  Weeks    Status  New    Target Date  03/30/18      PT LONG TERM GOAL #4   Title  Perform ADLs/IADLs including dressing, bathing with left shoulder pain 2/10 or less    Baseline  5/10 pain    Time  6    Period  Weeks    Status  New    Target Date  03/30/18            Plan - 02/22/18 1623    Clinical Impression Statement  Jordan Evans reports some improvement in shoulder pain - no pain upon entering clinic. Progressing with stretching and working on posterior shoulder girdle control pulling scapulae down and back. Will benefit form continued treatment to achieve goals.     PT Next Visit Plan  Left shoulder PROM/ AAROM, progress supine/sidelying flexion and abduction ROM to standing pending mechanics, STM and gentle joint mobs as needed, progress T-band exercises as tolerated-add shoulder ext    PT Home Exercise Plan  supine wand flexion stretch vs. table slide, sidelying abduction, wand ER stretch, Theraband ER/IR, rows    Consulted and Agree with Plan of Care  Patient       Patient will benefit from skilled therapeutic intervention in order to improve the following deficits and impairments:  Pain, Impaired UE functional use, Decreased strength, Decreased range of motion, Decreased activity tolerance  Visit Diagnosis: Stiffness of left shoulder, not elsewhere classified  Muscle weakness (generalized)  Left shoulder pain, unspecified chronicity     Problem List Patient Active Problem List  Diagnosis Date Noted  . BMI 26.0-26.9,adult 12/13/2017  . Irregular heartbeat 09/08/2017  . Encounter for screening fecal occult blood testing 08/25/2017  . Acute bronchitis 07/06/2017  . Screening for AAA (abdominal aortic aneurysm) 06/16/2017  . Hypothyroidism 06/16/2017  . Dizziness 03/17/2017  . Vasovagal syncope 03/17/2017  . Yeast infection 12/14/2016  . H/O multiple  pulmonary nodules 12/14/2016  . Health care maintenance 08/31/2016  . Kidney calculi 08/31/2016  . Hernia of abdominal cavity 08/31/2016  . HTN (hypertension) 08/31/2016  . Hyperlipidemia 08/31/2016     Nilda Simmer PT, MPH 02/22/2018, 5:21 PM  [facility] [address] [place], [place], [address] Phone: [phone]   Fax:  [phone]  Name: Jordan Evans MRN: [id number] Date of Birth: 07-25-1950

## 2018-02-27 ENCOUNTER — Ambulatory Visit: Payer: Medicare Other | Admitting: Physical Therapy

## 2018-02-27 ENCOUNTER — Encounter: Payer: Self-pay | Admitting: Physical Therapy

## 2018-02-27 DIAGNOSIS — M25612 Stiffness of left shoulder, not elsewhere classified: Secondary | ICD-10-CM | POA: Diagnosis not present

## 2018-02-27 DIAGNOSIS — M25512 Pain in left shoulder: Secondary | ICD-10-CM | POA: Diagnosis not present

## 2018-02-27 DIAGNOSIS — M6281 Muscle weakness (generalized): Secondary | ICD-10-CM | POA: Diagnosis not present

## 2018-02-27 NOTE — Therapy (Signed)
[place], [place], [address] Phone: [phone]   Fax:  [phone]  Physical Therapy Treatment  Patient Details  Name: Jordan Evans MRN: [id number] Date of Birth: January 15, 1951 Referring Provider (PT): Jean Rosenthal, MD   Encounter Date: 02/27/2018  PT End of Session - 02/27/18 1113    Visit Number  4    Number of Visits  12    Date for PT Re-Evaluation  03/30/18    Authorization Type  Medicare-Add Kx at 15 visits    PT Start Time  0731    PT Stop Time  0800    PT Time Calculation (min)  29 min    Activity Tolerance  Patient tolerated treatment well    Behavior During Therapy  [name] for tasks assessed/performed       Past Medical History:  Diagnosis Date  . History of kidney stones   . Hyperlipidemia   . Hypertension   . Hypothyroidism   . Syncope   . Thyroid disease     Past Surgical History:  Procedure Laterality Date  . EXTRACORPOREAL SHOCK WAVE LITHOTRIPSY Right 01/24/2017   Procedure: EXTRACORPOREAL SHOCK WAVE LITHOTRIPSY (ESWL);  Surgeon: Nickie Retort, MD;  Location: [facility];  Service: Urology;  Laterality: Right;  . HERNIA REPAIR     umbilical hernia- [phone]  . LITHOTRIPSY    . ROTATOR CUFF REPAIR Right   . URETHRAL DILATION     x5   . VASECTOMY      There were no vitals filed for this visit.  Subjective Assessment - 02/27/18 1115    Subjective  I have been doing the exercises.  I can use my arm a little longer now.    Currently in Pain?  No/denies    Pain Location  Shoulder    Pain Orientation  Left    Aggravating Factors   longer reaching    Pain Relieving Factors  rest         [facility] PT Assessment - 02/27/18 0001      AROM   Left Shoulder Flexion  122 Degrees    Left Shoulder ABduction  83 Degrees    Left Shoulder External Rotation  64 Degrees                   [facility] Adult PT Treatment/Exercise - 02/27/18 0001      Shoulder Exercises: Prone    Retraction  15 reps    Extension  10 reps   2 sets 0 LBS,  cues   Extension Weight (lbs)  --    Other Prone Exercises  row 10 X 2 sets   1 set AROM and 1 set 3 LBS,  cues initially     Shoulder Exercises: Sidelying   Flexion  10 reps    Flexion Limitations  uit UE ranger and scapular assist      Manual Therapy   Joint Mobilization  Grade 2 PA/AP an inferior mobs    scapular mobs     Soft tissue mobilization  upper quadrant,  tissue softened,      Passive ROM  left shoulder flexion,  abduction ER   ROM improving            PT Education - 02/27/18 1113    Education Details  exercise form, importance of posture    Person(s) Educated  Patient    Methods  Explanation;Demonstration;Tactile cues;Verbal cues    Comprehension  Verbalized understanding;Returned demonstration  PT Short Term Goals - 02/27/18 1117      PT SHORT TERM GOAL #1   Title  Independent with HEP    Baseline  independent so far    Time  3    Period  Weeks    Status  On-going      PT SHORT TERM GOAL #2   Title  Increasee left shoulder abduction AROM 10 deg or greater to improve ability use left UE for showering    Baseline  able to improve 3 degrees    Time  3    Period  Weeks    Status  On-going        PT Long Term Goals - 02/16/18 2000      PT LONG TERM GOAL #1   Title  Left shoulder strength grossly 5/5 to improve ability for lifting for chores, carrying grocery bags    Time  6    Period  Weeks    Status  New    Target Date  03/30/18      PT LONG TERM GOAL #2   Title  Left shoulder AROM grossly WFL to dresss and bathe with LUE use    Time  6    Period  Weeks    Status  New    Target Date  03/30/18      PT LONG TERM GOAL #3   Title  Improve FOTO outcome measure score 20% or greater    Baseline  54% limited    Time  6    Period  Weeks    Status  New    Target Date  03/30/18      PT LONG TERM GOAL #4   Title  Perform ADLs/IADLs including dressing, bathing with left  shoulder pain 2/10 or less    Baseline  5/10 pain    Time  6    Period  Weeks    Status  New    Target Date  03/30/18            Plan - 02/27/18 1114    Clinical Impression Statement  Pain 2/10 at end of session,  declined modalities.    ROM improving 144 flexion , see flow sheet for others.  Progress toward ROM goals.        PT Next Visit Plan  Left shoulder PROM/ AAROM, progress supine/sidelying flexion and abduction ROM to standing pending mechanics, STM and gentle joint mobs as needed, progress T-band exercises as tolerated-add shoulder ext    PT Home Exercise Plan  supine wand flexion stretch vs. table slide, sidelying abduction, wand ER stretch, Theraband ER/IR, rows    Consulted and Agree with Plan of Care  Patient       Patient will benefit from skilled therapeutic intervention in order to improve the following deficits and impairments:     Visit Diagnosis: Stiffness of left shoulder, not elsewhere classified  Muscle weakness (generalized)  Left shoulder pain, unspecified chronicity     Problem List Patient Active Problem List   Diagnosis Date Noted  . BMI 26.0-26.9,adult 12/13/2017  . Irregular heartbeat 09/08/2017  . Encounter for screening fecal occult blood testing 08/25/2017  . Acute bronchitis 07/06/2017  . Screening for AAA (abdominal aortic aneurysm) 06/16/2017  . Hypothyroidism 06/16/2017  . Dizziness 03/17/2017  . Vasovagal syncope 03/17/2017  . Yeast infection 12/14/2016  . H/O multiple pulmonary nodules 12/14/2016  . Health care maintenance 08/31/2016  . Kidney calculi 08/31/2016  . Hernia of abdominal  cavity 08/31/2016  . HTN (hypertension) 08/31/2016  . Hyperlipidemia 08/31/2016    ,  PTA 02/27/2018, 11:20 AM  [name] [address] [place], [place], [address] Phone: [phone]   Fax:  [phone]  Name: Jordan Evans MRN: [id number] Date of Birth: February 04, 1951

## 2018-02-28 ENCOUNTER — Encounter: Payer: Self-pay | Admitting: Physical Therapy

## 2018-02-28 ENCOUNTER — Ambulatory Visit: Payer: Medicare Other | Admitting: Physical Therapy

## 2018-02-28 DIAGNOSIS — M25612 Stiffness of left shoulder, not elsewhere classified: Secondary | ICD-10-CM

## 2018-02-28 DIAGNOSIS — M6281 Muscle weakness (generalized): Secondary | ICD-10-CM | POA: Diagnosis not present

## 2018-02-28 DIAGNOSIS — M25512 Pain in left shoulder: Secondary | ICD-10-CM

## 2018-02-28 NOTE — Therapy (Signed)
[place], [place], [address] Phone: [phone]   Fax:  [phone]  Physical Therapy Treatment  Patient Details  Name: Jordan Evans MRN: [id number] Date of Birth: 03/25/1951 Referring Provider (PT): Jean Rosenthal, MD   Encounter Date: 02/28/2018  PT End of Session - 02/28/18 0819    Visit Number  5    Number of Visits  12    Date for PT Re-Evaluation  03/30/18    Authorization Type  Medicare-Add Kx at 15 visits    PT Start Time  0731    PT Stop Time  0815    PT Time Calculation (min)  44 min    Activity Tolerance  Patient tolerated treatment well    Behavior During Therapy  [facility] for tasks assessed/performed       Past Medical History:  Diagnosis Date  . History of kidney stones   . Hyperlipidemia   . Hypertension   . Hypothyroidism   . Syncope   . Thyroid disease     Past Surgical History:  Procedure Laterality Date  . EXTRACORPOREAL SHOCK WAVE LITHOTRIPSY Right 01/24/2017   Procedure: EXTRACORPOREAL SHOCK WAVE LITHOTRIPSY (ESWL);  Surgeon: Nickie Retort, MD;  Location: [facility];  Service: Urology;  Laterality: Right;  . HERNIA REPAIR     umbilical hernia- [phone]  . LITHOTRIPSY    . ROTATOR CUFF REPAIR Right   . URETHRAL DILATION     x5   . VASECTOMY      There were no vitals filed for this visit.  Subjective Assessment - 02/28/18 0736    Subjective  My arm felt good after last session, I was able to hang a ceiling fan.  No pain now.    Currently in Pain?  No/denies    Pain Location  Shoulder                       [facility] Adult PT Treatment/Exercise - 02/28/18 0001      Shoulder Exercises: Standing   Other Standing Exercises  self mobs for IR  ROM with rolled towel    Other Standing Exercises  wall push up  10 C mod cues initially      Shoulder Exercises: Pulleys   Flexion  2 minutes      Shoulder Exercises: Stretch   Other Shoulder Stretches   doorway stretch 3 X 30 Noted left neck musculature greatly larger than right.  HX of whiplash       Cryotherapy   Number Minutes Cryotherapy  10 Minutes    Cryotherapy Location  Shoulder    Type of Cryotherapy  --   cold pack     Manual Therapy   Joint Mobilization  Grade 2 PA/AP an inferior mobs    scapular mobs     Soft tissue mobilization  upper quadrant,  tissue softened,      Passive ROM  ER stretching,  IR stretching with distractions,  scapular mobs  ER/IR ROM improving             PT Education - 02/28/18 0818    Education Details  exercise form    Person(s) Educated  Patient    Methods  Explanation;Demonstration    Comprehension  Verbalized understanding;Returned demonstration       PT Short Term Goals - 02/27/18 1117      PT SHORT TERM GOAL #1   Title  Independent with HEP    Baseline  independent so far    Time  3    Period  Weeks    Status  On-going      PT SHORT TERM GOAL #2   Title  Increasee left shoulder abduction AROM 10 deg or greater to improve ability use left UE for showering    Baseline  able to improve 3 degrees    Time  3    Period  Weeks    Status  On-going        PT Long Term Goals - 02/16/18 2000      PT LONG TERM GOAL #1   Title  Left shoulder strength grossly 5/5 to improve ability for lifting for chores, carrying grocery bags    Time  6    Period  Weeks    Status  New    Target Date  03/30/18      PT LONG TERM GOAL #2   Title  Left shoulder AROM grossly WFL to dresss and bathe with LUE use    Time  6    Period  Weeks    Status  New    Target Date  03/30/18      PT LONG TERM GOAL #3   Title  Improve FOTO outcome measure score 20% or greater    Baseline  54% limited    Time  6    Period  Weeks    Status  New    Target Date  03/30/18      PT LONG TERM GOAL #4   Title  Perform ADLs/IADLs including dressing, bathing with left shoulder pain 2/10 or less    Baseline  5/10 pain    Time  6    Period  Weeks    Status   New    Target Date  03/30/18            Plan - 02/28/18 0819    Clinical Impression Statement  pain 4/10 at end of session from manual.  Pain addressed with cold pack.  IR improved 1 inch with reaching up back however it had tightened by end of session.  ER ROM gained, limited 15 % with hands behind head.      PT Next Visit Plan  Left shoulder PROM/ AAROM, progress supine/sidelying flexion and abduction ROM to standing pending mechanics, STM and gentle joint mobs as needed, progress T-band exercises as tolerated-add shoulder ext    PT Home Exercise Plan  supine wand flexion stretch vs. table slide, sidelying abduction, wand ER stretch, Theraband ER/IR, rows    Consulted and Agree with Plan of Care  Patient       Patient will benefit from skilled therapeutic intervention in order to improve the following deficits and impairments:     Visit Diagnosis: Stiffness of left shoulder, not elsewhere classified  Muscle weakness (generalized)  Left shoulder pain, unspecified chronicity     Problem List Patient Active Problem List   Diagnosis Date Noted  . BMI 26.0-26.9,adult 12/13/2017  . Irregular heartbeat 09/08/2017  . Encounter for screening fecal occult blood testing 08/25/2017  . Acute bronchitis 07/06/2017  . Screening for AAA (abdominal aortic aneurysm) 06/16/2017  . Hypothyroidism 06/16/2017  . Dizziness 03/17/2017  . Vasovagal syncope 03/17/2017  . Yeast infection 12/14/2016  . H/O multiple pulmonary nodules 12/14/2016  . Health care maintenance 08/31/2016  . Kidney calculi 08/31/2016  . Hernia of abdominal cavity 08/31/2016  . HTN (hypertension) 08/31/2016  . Hyperlipidemia 08/31/2016    HARRIS,KAREN PTA  02/28/2018, 8:22 AM  [place], [place], [address] Phone: [phone]   Fax:  [phone]  Name: Jordan Evans MRN: [id number] Date of Birth: July 31, 1950

## 2018-03-07 ENCOUNTER — Encounter: Payer: Self-pay | Admitting: Physical Therapy

## 2018-03-07 ENCOUNTER — Ambulatory Visit: Payer: Medicare Other | Attending: Orthopaedic Surgery | Admitting: Physical Therapy

## 2018-03-07 DIAGNOSIS — M6281 Muscle weakness (generalized): Secondary | ICD-10-CM | POA: Diagnosis not present

## 2018-03-07 DIAGNOSIS — M25612 Stiffness of left shoulder, not elsewhere classified: Secondary | ICD-10-CM | POA: Diagnosis not present

## 2018-03-07 DIAGNOSIS — M25512 Pain in left shoulder: Secondary | ICD-10-CM | POA: Insufficient documentation

## 2018-03-07 NOTE — Therapy (Signed)
[place], [place], [address] Phone: [phone]   Fax:  [phone]  Physical Therapy Treatment  Patient Details  Name: Jordan Evans MRN: [id number] Date of Birth: [birth year] Referring Provider (PT): Jean Rosenthal, MD   Encounter Date: 03/07/2018  PT End of Session - 03/07/18 0827    Visit Number  6    Number of Visits  12    Date for PT Re-Evaluation  03/30/18    Authorization Type  Medicare-Add Kx at 15 visits    PT Start Time  0730   30 min appointment with heat   PT Stop Time  0811    PT Time Calculation (min)  41 min    Activity Tolerance  Patient tolerated treatment well    Behavior During Therapy  [facility] for tasks assessed/performed       Past Medical History:  Diagnosis Date  . History of kidney stones   . Hyperlipidemia   . Hypertension   . Hypothyroidism   . Syncope   . Thyroid disease     Past Surgical History:  Procedure Laterality Date  . EXTRACORPOREAL SHOCK WAVE LITHOTRIPSY Right 01/24/2017   Procedure: EXTRACORPOREAL SHOCK WAVE LITHOTRIPSY (ESWL);  Surgeon: Nickie Retort, MD;  Location: [facility];  Service: Urology;  Laterality: Right;  . HERNIA REPAIR     umbilical hernia- [phone]  . LITHOTRIPSY    . ROTATOR CUFF REPAIR Right   . URETHRAL DILATION     x5   . VASECTOMY      There were no vitals filed for this visit.  Subjective Assessment - 03/07/18 0735    Subjective  I passed trhe kidney stone.  My wife misplaced the bands and pulleys.           [facility] PT Assessment - 03/07/18 0001      AROM   Left Shoulder ABduction  86 Degrees                   [facility] Adult PT Treatment/Exercise - 03/07/18 0001      Shoulder Exercises: Sidelying   ABduction  5 reps;AROM   able to get 90 degrees.      Shoulder Exercises: Standing   Other Standing Exercises  wall ladder abduction 10 X cued body position.     Other Standing Exercises  cane  extension and  IR stretch      Shoulder Exercises: Pulleys   Flexion  2 minutes    Scaption  2 minutes      Moist Heat Therapy   Number Minutes Moist Heat  10 Minutes    Moist Heat Location  Shoulder      Manual Therapy   Joint Mobilization  Grade 2 PA/AP an inferior mobs     Soft tissue mobilization  myofascial work anterior shoulder  lateral glides improved    Passive ROM  abduction ,  ER             PT Education - 03/07/18 0827    Education Details  HEP    Person(s) Educated  Patient    Methods  Explanation;Demonstration;Verbal cues;Handout    Comprehension  Returned demonstration;Verbalized understanding       PT Short Term Goals - 03/07/18 0051      PT SHORT TERM GOAL #1   Title  Independent with HEP    Baseline  independent     Time  3    Period  Weeks  Status  Achieved      PT SHORT TERM GOAL #2   Title  Increasee left shoulder abduction AROM 10 deg or greater to improve ability use left UE for showering    Baseline  gradually improving    Time  3    Period  Weeks    Status  On-going        PT Long Term Goals - 03/07/18 0840      PT LONG TERM GOAL #1   Title  Left shoulder strength grossly 5/5 to improve ability for lifting for chores, carrying grocery bags    Time  6    Status  Unable to assess      PT LONG TERM GOAL #2   Title  Left shoulder AROM grossly WFL to dresss and bathe with LUE use    Time  6    Period  Weeks    Status  Unable to assess      PT LONG TERM GOAL #3   Title  Improve FOTO outcome measure score 20% or greater    Time  6    Period  Weeks    Status  Unable to assess      PT LONG TERM GOAL #4   Title  Perform ADLs/IADLs including dressing, bathing with left shoulder pain 2/10 or less    Baseline  1/10    Time  6    Period  Weeks    Status  Achieved            Plan - 03/07/18 7425    Clinical Impression Statement  Pain goal for dressing met  with 1/10 pain with dressing. (LTG#4)  ARROM improving see flow sheet.  Patient  wanted to foucs on reaching behind today  no pain at end of session.      PT Next Visit Plan  review New ex.  ROM  behind,  Mobs  work toward goals.    PT Home Exercise Plan  supine wand flexion stretch vs. table slide, sidelying abduction, wand ER stretch, Theraband ER/IR, rows.  standing wand    Consulted and Agree with Plan of Care  Patient       Patient will benefit from skilled therapeutic intervention in order to improve the following deficits and impairments:     Visit Diagnosis: Stiffness of left shoulder, not elsewhere classified  Muscle weakness (generalized)  Left shoulder pain, unspecified chronicity     Problem List Patient Active Problem List   Diagnosis Date Noted  . BMI 26.0-26.9,adult 12/13/2017  . Irregular heartbeat 09/08/2017  . Encounter for screening fecal occult blood testing 08/25/2017  . Acute bronchitis 07/06/2017  . Screening for AAA (abdominal aortic aneurysm) 06/16/2017  . Hypothyroidism 06/16/2017  . Dizziness 03/17/2017  . Vasovagal syncope 03/17/2017  . Yeast infection 12/14/2016  . H/O multiple pulmonary nodules 12/14/2016  . Health care maintenance 08/31/2016  . Kidney calculi 08/31/2016  . Hernia of abdominal cavity 08/31/2016  . HTN (hypertension) 08/31/2016  . Hyperlipidemia 08/31/2016    Pennye Beeghly  PTA 03/07/2018, 9:45 AM  [facility] [address] [place], [place], [address] Phone: [phone]   Fax:  [phone]  Name: Jordan Evans MRN: [id number] Date of Birth: December 21, 1950

## 2018-03-07 NOTE — Patient Instructions (Signed)
Cane Exercise: Extension    Stand holding cane behind back with both hands palm-up. Lift the cane away from body. Hold __5__ seconds. Repeat __10_- 20_ times. Do __1__ sessions per day.  [email or web address].[email or web address]       ALSO Slide cane up back.  Hold 5 seconds  .  Repeat 10 to 20  X a day. Copyright  [employer]. All rights reserved.   May add weights to increase difficulty.

## 2018-03-08 ENCOUNTER — Ambulatory Visit: Payer: Medicare Other | Admitting: Physical Therapy

## 2018-03-08 ENCOUNTER — Encounter: Payer: Self-pay | Admitting: Physical Therapy

## 2018-03-08 DIAGNOSIS — M25612 Stiffness of left shoulder, not elsewhere classified: Secondary | ICD-10-CM | POA: Diagnosis not present

## 2018-03-08 DIAGNOSIS — M25512 Pain in left shoulder: Secondary | ICD-10-CM | POA: Diagnosis not present

## 2018-03-08 DIAGNOSIS — M6281 Muscle weakness (generalized): Secondary | ICD-10-CM

## 2018-03-08 NOTE — Therapy (Signed)
[place], [place], [address] Phone: [phone]   Fax:  [phone]  Physical Therapy Treatment  Patient Details  Name: Jordan Evans MRN: [id number] Date of Birth: 1950-07-19 Referring Provider (PT): Jean Rosenthal, MD   Encounter Date: 03/08/2018  PT End of Session - 03/08/18 2059    Visit Number  7    Number of Visits  12    Date for PT Re-Evaluation  03/30/18    Authorization Type  Medicare-Add Kx at 15 visits    PT Start Time  1629    PT Stop Time  1724    PT Time Calculation (min)  55 min    Activity Tolerance  Patient tolerated treatment well    Behavior During Therapy  [facility] for tasks assessed/performed       Past Medical History:  Diagnosis Date  . History of kidney stones   . Hyperlipidemia   . Hypertension   . Hypothyroidism   . Syncope   . Thyroid disease     Past Surgical History:  Procedure Laterality Date  . EXTRACORPOREAL SHOCK WAVE LITHOTRIPSY Right 01/24/2017   Procedure: EXTRACORPOREAL SHOCK WAVE LITHOTRIPSY (ESWL);  Surgeon: Nickie Retort, MD;  Location: [facility];  Service: Urology;  Laterality: Right;  . HERNIA REPAIR     umbilical hernia- [phone]  . LITHOTRIPSY    . ROTATOR CUFF REPAIR Right   . URETHRAL DILATION     x5   . VASECTOMY      There were no vitals filed for this visit.  Subjective Assessment - 03/08/18 1658    Subjective  Pt. reports had felt better after last session but had some sharp pain in shoulder while sitting in car/driving yesterday and today.    Currently in Pain?  Yes    Pain Score  2     Pain Location  Shoulder    Pain Orientation  Left    Pain Descriptors / Indicators  Dull;Aching    Pain Type  Surgical pain    Pain Onset  More than a month ago    Pain Frequency  Intermittent    Aggravating Factors   reaching, activity    Pain Relieving Factors  rest    Effect of Pain on Daily Activities  limits ability for reaching for ADLs          [facility] PT Assessment - 03/08/18 0001      AROM   Left Shoulder Flexion  125 Degrees    Left Shoulder ABduction  110 Degrees    Left Shoulder Internal Rotation  --   reach to T1   Left Shoulder External Rotation  60 Degrees   at 0 deg abd     Strength   Left Shoulder Flexion  4/5    Left Shoulder ABduction  4/5    Left Shoulder Internal Rotation  5/5    Left Shoulder External Rotation  5/5                   [facility] Adult PT Treatment/Exercise - 03/08/18 0001      Exercises   Exercises  Shoulder      Shoulder Exercises: Sidelying   External Rotation  Strengthening;Left;20 reps    ABduction  10 reps      Shoulder Exercises: Standing   External Rotation  Strengthening;Both;20 reps    Theraband Level (Shoulder External Rotation)  Level 2 (Red)    Other Standing Exercises  [place] flexion  red x 20, [place] abd yellow x 20    Other Standing Exercises  standing flexion and scaption to 90 deg x 10 ea.      Shoulder Exercises: Stretch   Internal Rotation Stretch  --   instructed in sleeper stretch for IR   Other Shoulder Stretches  posterior shoulder stretch 3 x 20 sec      Moist Heat Therapy   Number Minutes Moist Heat  10 Minutes    Moist Heat Location  Shoulder      Manual Therapy   Joint Mobilization  Grade I-II GH AP mobilization    Soft tissue mobilization  STM left posterior shoulder    Passive ROM  left shoulder 4-way       Trigger Point Dry Needling - 03/08/18 1715    Consent Given?  Yes    Education Handout Provided  Yes    Muscles Treated Upper Body  Infraspinatus   and teres minor/major, needling x 5 min total          PT Education - 03/08/18 2058    Education Details  HEP-added sleeper stretch for IR and posterior shoulder stretch, dry needling-risks and potential benefits    Person(s) Educated  Patient    Methods  Explanation;Demonstration;Verbal cues;Handout    Comprehension  Verbalized understanding;Returned demonstration        PT Short Term Goals - 03/08/18 2102      PT SHORT TERM GOAL #1   Title  Independent with HEP    Baseline  met with initial HEP, updated today    Time  3    Period  Weeks    Status  Achieved      PT SHORT TERM GOAL #2   Title  Increasee left shoulder abduction AROM 10 deg or greater to improve ability use left UE for showering    Baseline  met    Time  3    Period  Weeks    Status  Achieved        PT Long Term Goals - 03/08/18 2103      PT LONG TERM GOAL #1   Title  Left shoulder strength grossly 5/5 to improve ability for lifting for chores, carrying grocery bags    Baseline  see objective-progressing but not met for flexion and abduction    Time  6    Period  Weeks    Status  On-going      PT LONG TERM GOAL #2   Title  Left shoulder AROM grossly WFL to dresss and bathe with LUE use    Baseline  improving but still with limitations in abduction, flexion and IR>ER    Time  6    Period  Weeks    Status  On-going      PT LONG TERM GOAL #3   Title  Improve FOTO outcome measure score 20% or greater    Baseline  54% limited at eval, not retested    Time  6    Period  Weeks    Status  On-going      PT LONG TERM GOAL #4   Title  Perform ADLs/IADLs including dressing, bathing with left shoulder pain 2/10 or less    Baseline  improving, intermittently higher but progressing-2/10 pain today    Time  6    Period  Weeks    Status  On-going            Plan - 03/08/18 2100  Clinical Impression Statement  Improving ROM and strength from baseline but still limited with tightness at end-ranges ROM and still with shoulder weakness particularly in abduction and flexion.    PT Treatment/Interventions  Passive range of motion;ADLs/Self Care Home Management;Cryotherapy;Electrical Stimulation;Moist Heat;Therapeutic activities;Therapeutic exercise;Patient/family education;Manual techniques;Dry needling;Taping;Vasopneumatic Device    PT Next Visit Plan  Continue work on  ROM and strengthening, manual therapy as needed, further dry needling with PT sessions of noted to be beneficial    PT Home Exercise Plan  supine wand flexion stretch vs. table slide, sidelying abduction, wand ER stretch, Theraband ER/IR, rows.  standing wand, sleeper stretch and posterior shoulder stretch    Consulted and Agree with Plan of Care  Patient       Patient will benefit from skilled therapeutic intervention in order to improve the following deficits and impairments:     Visit Diagnosis: Stiffness of left shoulder, not elsewhere classified  Left shoulder pain, unspecified chronicity  Muscle weakness (generalized)     Problem List Patient Active Problem List   Diagnosis Date Noted  . BMI 26.0-26.9,adult 12/13/2017  . Irregular heartbeat 09/08/2017  . Encounter for screening fecal occult blood testing 08/25/2017  . Acute bronchitis 07/06/2017  . Screening for AAA (abdominal aortic aneurysm) 06/16/2017  . Hypothyroidism 06/16/2017  . Dizziness 03/17/2017  . Vasovagal syncope 03/17/2017  . Yeast infection 12/14/2016  . H/O multiple pulmonary nodules 12/14/2016  . Health care maintenance 08/31/2016  . Kidney calculi 08/31/2016  . Hernia of abdominal cavity 08/31/2016  . HTN (hypertension) 08/31/2016  . Hyperlipidemia 08/31/2016    Beaulah Dinning, PT, DPT 03/08/18 9:05 PM  [facility] [address] [place], [place], [address] Phone: [phone]   Fax:  [phone]  Name: JAMONTE CURFMAN MRN: [id number] Date of Birth: 10-12-1950

## 2018-03-08 NOTE — Patient Instructions (Signed)

## 2018-03-14 ENCOUNTER — Encounter: Payer: Self-pay | Admitting: Physical Therapy

## 2018-03-14 ENCOUNTER — Ambulatory Visit: Payer: Medicare Other | Admitting: Physical Therapy

## 2018-03-14 DIAGNOSIS — M25512 Pain in left shoulder: Secondary | ICD-10-CM

## 2018-03-14 DIAGNOSIS — M6281 Muscle weakness (generalized): Secondary | ICD-10-CM

## 2018-03-14 DIAGNOSIS — M25612 Stiffness of left shoulder, not elsewhere classified: Secondary | ICD-10-CM

## 2018-03-14 NOTE — Therapy (Signed)
[place], [place], [address] Phone: [phone]   Fax:  [phone]  Physical Therapy Treatment  Patient Details  Name: Jordan Evans MRN: [id number] Date of Birth: 11/27/1950 Referring Provider (PT): Jean Rosenthal, MD   Encounter Date: 03/14/2018  PT End of Session - 03/14/18 0817    Visit Number  8    Number of Visits  12    Date for PT Re-Evaluation  03/30/18    Authorization Type  Medicare-Add Kx at 15 visits    PT Start Time  0732    PT Stop Time  0810   30 min visit with cold pack   PT Time Calculation (min)  38 min    Activity Tolerance  Patient tolerated treatment well    Behavior During Therapy  [facility] for tasks assessed/performed       Past Medical History:  Diagnosis Date  . History of kidney stones   . Hyperlipidemia   . Hypertension   . Hypothyroidism   . Syncope   . Thyroid disease     Past Surgical History:  Procedure Laterality Date  . EXTRACORPOREAL SHOCK WAVE LITHOTRIPSY Right 01/24/2017   Procedure: EXTRACORPOREAL SHOCK WAVE LITHOTRIPSY (ESWL);  Surgeon: Nickie Retort, MD;  Location: [facility];  Service: Urology;  Laterality: Right;  . HERNIA REPAIR     umbilical hernia- [id number]  . LITHOTRIPSY    . ROTATOR CUFF REPAIR Right   . URETHRAL DILATION     x5   . VASECTOMY      There were no vitals filed for this visit.  Subjective Assessment - 03/14/18 0806    Subjective  Dry needle made him sore but woke up in am with less pain.  New exercise going well.      Currently in Pain?  Yes    Pain Score  2    at end of session   Pain Location  Shoulder    Pain Orientation  Left;Anterior    Pain Descriptors / Indicators  Aching;Sore    Pain Type  Surgical pain    Aggravating Factors   reaching activity    Pain Relieving Factors  rest,  dry needle,  cold pack    Effect of Pain on Daily Activities  reaching difficult for ADL's         [facility] PT Assessment - 03/14/18  0001      AROM   Left Shoulder Flexion  --   110 prior to PT  120 post session                  [facility] Adult PT Treatment/Exercise - 03/14/18 0001      Cryotherapy   Number Minutes Cryotherapy  10 Minutes    Cryotherapy Location  Shoulder    Type of Cryotherapy  --   cold pack     Manual Therapy   Joint Mobilization  Grade I-II GH AP mobilization    Soft tissue mobilization  STM left posterior shoulder, anterior shoulder    Passive ROM   scapular mobs,  contract relax with sleeper stretch with 10 second intervals,  ROM increased   all planes            PT Education - 03/14/18 0823    Education Details  exercise form    Person(s) Educated  Patient    Methods  Explanation;Demonstration;Tactile cues;Verbal cues    Comprehension  Verbalized understanding;Returned demonstration  PT Short Term Goals - 03/08/18 2102      PT SHORT TERM GOAL #1   Title  Independent with HEP    Baseline  met with initial HEP, updated today    Time  3    Period  Weeks    Status  Achieved      PT SHORT TERM GOAL #2   Title  Increasee left shoulder abduction AROM 10 deg or greater to improve ability use left UE for showering    Baseline  met    Time  3    Period  Weeks    Status  Achieved        PT Long Term Goals - 03/14/18 0981      PT LONG TERM GOAL #1   Title  Left shoulder strength grossly 5/5 to improve ability for lifting for chores, carrying grocery bags    Time  6    Status  Unable to assess      PT LONG TERM GOAL #2   Title  Left shoulder AROM grossly WFL to dresss and bathe with LUE use    Baseline  improving but still with limitations in abduction, flexion and IR>ER    Time  6    Period  Weeks    Status  On-going      PT LONG TERM GOAL #3   Title  Improve FOTO outcome measure score 20% or greater    Time  6    Period  Weeks    Status  Unable to assess      PT LONG TERM GOAL #4   Title  Perform ADLs/IADLs including dressing, bathing with left  shoulder pain 2/10 or less    Time  6    Period  Weeks    Status  Unable to assess            Plan - 03/14/18 0824    Clinical Impression Statement  ROM improving with stiffing between sessions.  2/10 pain at end of session.  Dry needle helpful with morning pain/ stiffness.     PT Next Visit Plan    FOTO Continue work on ROM and strengthening, manual therapy as needed, further dry needling with PT sessions of noted to be beneficial    PT Home Exercise Plan  supine wand flexion stretch vs. table slide, sidelying abduction, wand ER stretch, Theraband ER/IR, rows.  standing wand, sleeper stretch and posterior shoulder stretch    Consulted and Agree with Plan of Care  Patient       Patient will benefit from skilled therapeutic intervention in order to improve the following deficits and impairments:     Visit Diagnosis: Left shoulder pain, unspecified chronicity  Stiffness of left shoulder, not elsewhere classified  Muscle weakness (generalized)     Problem List Patient Active Problem List   Diagnosis Date Noted  . BMI 26.0-26.9,adult 12/13/2017  . Irregular heartbeat 09/08/2017  . Encounter for screening fecal occult blood testing 08/25/2017  . Acute bronchitis 07/06/2017  . Screening for AAA (abdominal aortic aneurysm) 06/16/2017  . Hypothyroidism 06/16/2017  . Dizziness 03/17/2017  . Vasovagal syncope 03/17/2017  . Yeast infection 12/14/2016  . H/O multiple pulmonary nodules 12/14/2016  . Health care maintenance 08/31/2016  . Kidney calculi 08/31/2016  . Hernia of abdominal cavity 08/31/2016  . HTN (hypertension) 08/31/2016  . Hyperlipidemia 08/31/2016    HARRIS,KAREN  PTA 03/14/2018, 8:41 AM  [place],  [place], [address] Phone: [phone]   Fax:  [phone]  Name: Jordan Evans MRN: [id number] Date of Birth: 1950/07/18

## 2018-03-20 DIAGNOSIS — N35911 Unspecified urethral stricture, male, meatal: Secondary | ICD-10-CM | POA: Diagnosis not present

## 2018-03-20 DIAGNOSIS — N481 Balanitis: Secondary | ICD-10-CM | POA: Diagnosis not present

## 2018-03-20 DIAGNOSIS — Z87442 Personal history of urinary calculi: Secondary | ICD-10-CM | POA: Diagnosis not present

## 2018-03-21 ENCOUNTER — Ambulatory Visit: Payer: Medicare Other | Admitting: Physical Therapy

## 2018-03-21 ENCOUNTER — Encounter: Payer: Self-pay | Admitting: Physical Therapy

## 2018-03-21 DIAGNOSIS — M25512 Pain in left shoulder: Secondary | ICD-10-CM

## 2018-03-21 DIAGNOSIS — M6281 Muscle weakness (generalized): Secondary | ICD-10-CM | POA: Diagnosis not present

## 2018-03-21 DIAGNOSIS — M25612 Stiffness of left shoulder, not elsewhere classified: Secondary | ICD-10-CM

## 2018-03-21 NOTE — Therapy (Signed)
[place], [place], [address] Phone: [phone]   Fax:  [phone]  Physical Therapy Treatment  Patient Details  Name: Jordan Evans MRN: [id number] Date of Birth: 1951-02-18 Referring Provider (PT): Jean Rosenthal, MD   Encounter Date: 03/21/2018  PT End of Session - 03/21/18 0825    Visit Number  9    Number of Visits  12    Date for PT Re-Evaluation  03/30/18    Authorization Type  Medicare-Add Kx at 15 visits    PT Start Time  0730   30 min with cold pack   PT Stop Time  0812    PT Time Calculation (min)  42 min    Activity Tolerance  Patient tolerated treatment well    Behavior During Therapy  [facility] for tasks assessed/performed       Past Medical History:  Diagnosis Date  . History of kidney stones   . Hyperlipidemia   . Hypertension   . Hypothyroidism   . Syncope   . Thyroid disease     Past Surgical History:  Procedure Laterality Date  . EXTRACORPOREAL SHOCK WAVE LITHOTRIPSY Right 01/24/2017   Procedure: EXTRACORPOREAL SHOCK WAVE LITHOTRIPSY (ESWL);  Surgeon: Nickie Retort, MD;  Location: [facility];  Service: Urology;  Laterality: Right;  . HERNIA REPAIR     umbilical hernia- 6387  . LITHOTRIPSY    . ROTATOR CUFF REPAIR Right   . URETHRAL DILATION     x5   . VASECTOMY      There were no vitals filed for this visit.  Subjective Assessment - 03/21/18 0831    Subjective  No pain with ADL.  I want to stretch today.    Currently in Pain?  Yes    Pain Score  1     Pain Location  Shoulder    Pain Descriptors / Indicators  Aching    Pain Frequency  Intermittent    Aggravating Factors   end range ROM    Pain Relieving Factors  rest dry needle stretching cold pack    Effect of Pain on Daily Activities  some reaching difficult,  extra time         [facility] PT Assessment - 03/21/18 0001      Observation/Other Assessments   Focus on Therapeutic Outcomes (FOTO)   35%  limitation      Strength   Left Shoulder Flexion  5/5    Left Shoulder ABduction  4+/5   nmild pain   Left Shoulder Internal Rotation  5/5    Left Shoulder External Rotation  4+/5   mild pain                  [facility] Adult PT Treatment/Exercise - 03/21/18 0001      Shoulder Exercises: Standing   Other Standing Exercises  standing shoulder abduction.  red band,  added to HEp  cued to keep shoulder away from ear.  thumb position.      Cryotherapy   Number Minutes Cryotherapy  10 Minutes    Cryotherapy Location  Shoulder    Type of Cryotherapy  --   cold pack     Manual Therapy   Joint Mobilization  Grade I-II GH AP mobilization    Soft tissue mobilization  STM left posterior shoulder, anterior shoulder    Passive ROM  LT shoulder             PT Education - 03/21/18 5643  Education Details  HEP    Person(s) Educated  Patient    Methods  Explanation;Demonstration;Verbal cues;Handout    Comprehension  Verbalized understanding;Returned demonstration       PT Short Term Goals - 03/08/18 2102      PT SHORT TERM GOAL #1   Title  Independent with HEP    Baseline  met with initial HEP, updated today    Time  3    Period  Weeks    Status  Achieved      PT SHORT TERM GOAL #2   Title  Increasee left shoulder abduction AROM 10 deg or greater to improve ability use left UE for showering    Baseline  met    Time  3    Period  Weeks    Status  Achieved        PT Long Term Goals - 03/21/18 3267      PT LONG TERM GOAL #1   Title  Left shoulder strength grossly 5/5 to improve ability for lifting for chores, carrying grocery bags    Baseline  4+ to 5/5    Time  6    Period  Weeks    Status  Partially Met      PT LONG TERM GOAL #2   Title  Left shoulder AROM grossly WFL to dresss and bathe with LUE use    Baseline  improving but still with limitations in abduction, flexion and IR>ER    Time  6    Period  Weeks    Status  On-going      PT LONG TERM  GOAL #3   Title  Improve FOTO outcome measure score 20% or greater    Baseline  35% limited,  improving    Time  6    Period  Weeks    Status  On-going      PT LONG TERM GOAL #4   Title  Perform ADLs/IADLs including dressing, bathing with left shoulder pain 2/10 or less    Baseline  no Jordan pain wuth this    Time  6    Period  Weeks    Status  Achieved            Plan - 03/21/18 1245    Clinical Impression Statement  pain 1/10 at end of session.  FOTO improved to 35% limitation.  shoulder strength 4+/5 to 5/5.  see flow sheet.  Patient feels he will ber ready for discharge by end of plan of care.   LTG#1 partially met.  LTG#4 met.    PT Next Visit Plan  Work toward [employer],  manual continue contract/relax sleeper stretch     PT Home Exercise Plan  supine wand flexion stretch vs. table slide, sidelying abduction, wand ER stretch, Theraband ER/IR, rows.  standing wand, sleeper stretch and posterior shoulder stretch.  bans shoulder abduction.    Consulted and Agree with Plan of Care  Patient       Patient will benefit from skilled therapeutic intervention in order to improve the following deficits and impairments:     Visit Diagnosis: Left shoulder pain, unspecified chronicity  Stiffness of left shoulder, not elsewhere classified  Muscle weakness (generalized)     Problem List Patient Active Problem List   Diagnosis Date Noted  . BMI 26.0-26.9,adult 12/13/2017  . Irregular heartbeat 09/08/2017  . Encounter for screening fecal occult blood testing 08/25/2017  . Acute bronchitis 07/06/2017  . Screening for AAA (abdominal aortic aneurysm) 06/16/2017  .  Hypothyroidism 06/16/2017  . Dizziness 03/17/2017  . Vasovagal syncope 03/17/2017  . Yeast infection 12/14/2016  . H/O multiple pulmonary nodules 12/14/2016  . Health care maintenance 08/31/2016  . Kidney calculi 08/31/2016  . Hernia of abdominal cavity 08/31/2016  . HTN (hypertension) 08/31/2016  . Hyperlipidemia  08/31/2016    HARRIS,KAREN PTA 03/21/2018, 8:33 AM  [facility] [address] [place], [place], [address] Phone: [phone]   Fax:  [phone]  Name: SHREYAN HINZ MRN: [id number] Date of Birth: March 31, 1951

## 2018-03-21 NOTE — Patient Instructions (Signed)
Issued from exercise drawer. Shoulder abduction red band Daily 1 to 5 seconds 10 to 30 X

## 2018-03-23 ENCOUNTER — Encounter: Payer: Self-pay | Admitting: Physical Therapy

## 2018-03-23 ENCOUNTER — Ambulatory Visit: Payer: Medicare Other | Admitting: Physical Therapy

## 2018-03-23 DIAGNOSIS — M6281 Muscle weakness (generalized): Secondary | ICD-10-CM | POA: Diagnosis not present

## 2018-03-23 DIAGNOSIS — M25512 Pain in left shoulder: Secondary | ICD-10-CM

## 2018-03-23 DIAGNOSIS — M25612 Stiffness of left shoulder, not elsewhere classified: Secondary | ICD-10-CM

## 2018-03-23 NOTE — Therapy (Signed)
[place], [place], [address] Phone: [phone]   Fax:  [phone]  Physical Therapy Treatment/Discharge  Patient Details  Name: Jordan Evans MRN: [id number] Date of Birth: [birth year] Referring Provider (PT): Jean Rosenthal, MD   Encounter Date: 03/23/2018  PT End of Session - 03/23/18 1659    Visit Number  10    Number of Visits  12    Date for PT Re-Evaluation  03/30/18    Authorization Type  Medicare-Add Kx at 15 visits    PT Start Time  1629    PT Stop Time  1708    PT Time Calculation (min)  39 min    Activity Tolerance  Patient tolerated treatment well    Behavior During Therapy  [facility] for tasks assessed/performed       Past Medical History:  Diagnosis Date  . History of kidney stones   . Hyperlipidemia   . Hypertension   . Hypothyroidism   . Syncope   . Thyroid disease     Past Surgical History:  Procedure Laterality Date  . EXTRACORPOREAL SHOCK WAVE LITHOTRIPSY Right 01/24/2017   Procedure: EXTRACORPOREAL SHOCK WAVE LITHOTRIPSY (ESWL);  Surgeon: Nickie Retort, MD;  Location: [facility];  Service: Urology;  Laterality: Right;  . HERNIA REPAIR     umbilical hernia- 6606  . LITHOTRIPSY    . ROTATOR CUFF REPAIR Right   . URETHRAL DILATION     x5   . VASECTOMY      There were no vitals filed for this visit.  Subjective Assessment - 03/23/18 1658    Subjective  No pain pre-tx. Pain minimal but main complaint is shoulder stiffness. Discussed status and pt. reports ready for d/c today-feels like he can manage stretches at home at this point.    Pertinent History  s/p left shoulder arthroscopic surgery, previous history right rotator cuff surgery    Limitations  Lifting;House hold activities    How long can you sit comfortably?  no limitations    How long can you stand comfortably?  no limitations    How long can you walk comfortably?  no limitations    Diagnostic tests  MRI,  X-rays    Patient Stated Goals  Improve left shoulder use/decrease pain    Currently in Pain?  No/denies    Pain Score  0-No pain         [facility] PT Assessment - 03/23/18 0001      AROM   Right Shoulder Flexion  150 Degrees    Right Shoulder ABduction  160 Degrees    Right Shoulder Internal Rotation  --   reach to T10   Right Shoulder External Rotation  --   60 deg at 0 deg abd   Left Shoulder Flexion  110 Degrees    Left Shoulder ABduction  90 Degrees    Left Shoulder Internal Rotation  --   reach to L1   Left Shoulder External Rotation  --   50 at 0 deg abd     PROM   Left Shoulder Flexion  120 Degrees    Left Shoulder ABduction  100 Degrees    Left Shoulder Internal Rotation  60 Degrees    Left Shoulder External Rotation  70 Degrees      Strength   Left Shoulder Flexion  5/5    Left Shoulder ABduction  4+/5    Left Shoulder Internal Rotation  5/5    Left Shoulder External  Rotation  4+/5                   [facility] Adult PT Treatment/Exercise - 03/23/18 0001      Shoulder Exercises: Supine   Other Supine Exercises  ER stretch with wand 5-10 sec holds x 10 reps      Shoulder Exercises: Sidelying   Other Sidelying Exercises  sleeper stretch 20 sec x 3      Shoulder Exercises: Standing   Other Standing Exercises  wall slide flexion stretch in doorwar 5-10 sec holds x 10      Shoulder Exercises: Stretch   Corner Stretch  --   ER stretch in doorway 20 sec x 3     Manual Therapy   Soft tissue mobilization  STM posterior shoulder and subscapularis release    Passive ROM  Lt shoulder             PT Education - 03/23/18 1659    Education Details  HEP, POC, tennis ball release left posterior shoulder     Person(s) Educated  Patient    Methods  Explanation;Demonstration    Comprehension  Verbalized understanding;Returned demonstration       PT Short Term Goals - 03/23/18 1702      PT SHORT TERM GOAL #1   Title  Independent with HEP    Baseline   met    Time  3    Period  Weeks    Status  Achieved      PT SHORT TERM GOAL #2   Title  Increasee left shoulder abduction AROM 10 deg or greater to improve ability use left UE for showering    Baseline  met    Time  3    Period  Weeks    Status  Achieved        PT Long Term Goals - 03/23/18 1702      PT LONG TERM GOAL #1   Title  Left shoulder strength grossly 5/5 to improve ability for lifting for chores, carrying grocery bags    Baseline  4+ to 5/5    Time  6    Period  Weeks    Status  Partially Met      PT LONG TERM GOAL #2   Title  Left shoulder AROM grossly WFL to dresss and bathe with LUE use    Baseline  improving but still with limitations in abduction, flexion and IR>ER    Time  6    Period  Weeks    Status  Not Met      PT LONG TERM GOAL #3   Title  Improve FOTO outcome measure score 20% or greater    Baseline  19% improvement    Time  6    Period  Weeks    Status  Not Met      PT LONG TERM GOAL #4   Title  Perform ADLs/IADLs including dressing, bathing with left shoulder pain 2/10 or less    Baseline  met    Time  6    Period  Weeks    Status  Achieved            Plan - 03/23/18 1659    Clinical Impression Statement  Mild shoulder weakness but main limitations due to shoulder stiffness with capsular restriction ROM. Improved from baseline with ROM/strength but still with limitations as noted in objective. At this point per pt. request plan d/c to HEP and pt. will  follow up with MD as needed for any ongoing issues.    Clinical Decision Making  Low    Clinical Impairments Affecting Rehab Potential  No other significant comorbidities, pt. motivated and expect will progress well with therapy    PT Frequency  2x / week    PT Duration  6 weeks    PT Treatment/Interventions  Passive range of motion;ADLs/Self Care Home Management;Cryotherapy;Electrical Stimulation;Moist Heat;Therapeutic activities;Therapeutic exercise;Patient/family education;Manual  techniques;Dry needling;Taping;Vasopneumatic Device    PT Next Visit Plan  d/c to HEP    PT Home Exercise Plan  supine wand flexion stretch vs. table slide, sidelying abduction, wand ER stretch, Theraband ER/IR, rows.  standing wand, sleeper stretch and posterior shoulder stretch.  bans shoulder abduction.    Consulted and Agree with Plan of Care  Patient       Patient will benefit from skilled therapeutic intervention in order to improve the following deficits and impairments:  Pain, Impaired UE functional use, Decreased strength, Decreased range of motion, Decreased activity tolerance  Visit Diagnosis: Left shoulder pain, unspecified chronicity  Stiffness of left shoulder, not elsewhere classified  Muscle weakness (generalized)     Problem List Patient Active Problem List   Diagnosis Date Noted  . BMI 26.0-26.9,adult 12/13/2017  . Irregular heartbeat 09/08/2017  . Encounter for screening fecal occult blood testing 08/25/2017  . Acute bronchitis 07/06/2017  . Screening for AAA (abdominal aortic aneurysm) 06/16/2017  . Hypothyroidism 06/16/2017  . Dizziness 03/17/2017  . Vasovagal syncope 03/17/2017  . Yeast infection 12/14/2016  . H/O multiple pulmonary nodules 12/14/2016  . Health care maintenance 08/31/2016  . Kidney calculi 08/31/2016  . Hernia of abdominal cavity 08/31/2016  . HTN (hypertension) 08/31/2016  . Hyperlipidemia 08/31/2016         PHYSICAL THERAPY DISCHARGE SUMMARY  Visits from Start of Care: 10  Current functional level related to goals / functional outcomes: Improved pain and activity tolerance but still some stiffness limiting ability reaching ADLs.   Remaining deficits: Left shoulder stiffness and mild weakness   Education / Equipment: HEP, POC Plan: Patient agrees to discharge.  Patient goals were partially met. Patient is being discharged due to the patient's request.  ?????            Beaulah Dinning, PT, DPT 03/23/18 5:09  PM     [place] [facility] [address] [place], [place], [address] Phone: [phone]   Fax:  [phone]  Name: Jordan Evans MRN: [id number] Date of Birth: July 28, 1950

## 2018-04-25 ENCOUNTER — Ambulatory Visit (INDEPENDENT_AMBULATORY_CARE_PROVIDER_SITE_OTHER): Payer: Medicare Other | Admitting: Family Medicine

## 2018-04-25 ENCOUNTER — Ambulatory Visit (INDEPENDENT_AMBULATORY_CARE_PROVIDER_SITE_OTHER): Payer: Medicare Other | Admitting: Orthopaedic Surgery

## 2018-04-25 ENCOUNTER — Encounter (INDEPENDENT_AMBULATORY_CARE_PROVIDER_SITE_OTHER): Payer: Self-pay | Admitting: Family Medicine

## 2018-04-25 DIAGNOSIS — G8929 Other chronic pain: Secondary | ICD-10-CM | POA: Diagnosis not present

## 2018-04-25 DIAGNOSIS — M25512 Pain in left shoulder: Secondary | ICD-10-CM

## 2018-04-25 MED ORDER — METHYLPREDNISOLONE ACETATE 40 MG/ML IJ SUSP: 40.0000 mg | Freq: Once | INTRAMUSCULAR | Status: DC

## 2018-04-25 NOTE — Progress Notes (Signed)
Office Visit Note   Patient: Jordan Evans           Date of Birth: [birth year]           MRN: [id number] Visit Date: 04/25/2018 Requested by: Esaw Grandchild, NP [place], [place] [address] PCP: Esaw Grandchild, NP  Subjective: Chief Complaint  Patient presents with  . Left Shoulder - Pain    US - guided cortisone injection per Dr. Ninfa Linden    HPI: He is here for ultrasound-guided left shoulder injection.  He has been struggling with chronic pain status post arthroscopic debridement.  He complains of pain in the anterior shoulder.  Subacromial injection did not help.              ROS: Otherwise noncontributory  Objective: Vital Signs: There were no vitals taken for this visit.  Physical Exam:  He has adhesive capsulitis, abduction limited to about 60 degrees.  Pain with abduction as well, but good rotator cuff strength.  No tenderness at the [facility] joint or the long head biceps tendon but he is tender anterior laterally over the humeral head.  Imaging: Ultrasound was used to image the anterior shoulder over the area of tenderness and he has a teardrop shaped fluid-filled subdeltoid bursa.  Assessment & Plan: 1.  Persistent left shoulder pain status post arthroscopic debridement, clinically localized to fluid-filled bursa.  He has adhesive capsulitis as well. -First I did a diagnostic injection into the fluid-filled bursa with 3 cc 1% lidocaine without epinephrine.  He became completely pain-free within 5 minutes.  His shoulder range of motion did not improve, but he had no further pain.  Therefore I injected 40 mg methylprednisolone into the same area using ultrasound to guide needle placement both times. -If he does not get lasting relief with this could do glenohumeral injection next.  He will keep working on range of motion at home.   Follow-Up Instructions: No follow-ups on file.      Procedures: No procedures performed  No notes on file    PMFS  History: Patient Active Problem List   Diagnosis Date Noted  . BMI 26.0-26.9,adult 12/13/2017  . Irregular heartbeat 09/08/2017  . Encounter for screening fecal occult blood testing 08/25/2017  . Acute bronchitis 07/06/2017  . Screening for AAA (abdominal aortic aneurysm) 06/16/2017  . Hypothyroidism 06/16/2017  . Dizziness 03/17/2017  . Vasovagal syncope 03/17/2017  . Yeast infection 12/14/2016  . H/O multiple pulmonary nodules 12/14/2016  . Health care maintenance 08/31/2016  . Kidney calculi 08/31/2016  . Hernia of abdominal cavity 08/31/2016  . HTN (hypertension) 08/31/2016  . Hyperlipidemia 08/31/2016   Past Medical History:  Diagnosis Date  . History of kidney stones   . Hyperlipidemia   . Hypertension   . Hypothyroidism   . Syncope   . Thyroid disease     Family History  Problem Relation Age of Onset  . Cancer Mother        ovarian  . Hyperlipidemia Mother   . Hypertension Mother   . Heart attack Father   . Hyperlipidemia Father   . Hypertension Father   . Hyperlipidemia Sister   . Hypertension Sister   . Cancer Brother        lymph  . Hyperlipidemia Brother   . Hypertension Brother   . Diabetes Paternal Grandmother   . Hyperlipidemia Brother   . Hypertension Brother   . Hyperlipidemia Brother   . Hypertension Brother   . Hyperlipidemia  Brother   . Hypertension Brother     Past Surgical History:  Procedure Laterality Date  . EXTRACORPOREAL SHOCK WAVE LITHOTRIPSY Right 01/24/2017   Procedure: EXTRACORPOREAL SHOCK WAVE LITHOTRIPSY (ESWL);  Surgeon: Nickie Retort, MD;  Location: [facility];  Service: Urology;  Laterality: Right;  . HERNIA REPAIR     umbilical hernia- 6606  . LITHOTRIPSY    . ROTATOR CUFF REPAIR Right   . URETHRAL DILATION     x5   . VASECTOMY     Social History   Occupational History  . Not on file  Tobacco Use  . Smoking status: Never Smoker  . Smokeless tobacco: Never Used  Substance and Sexual Activity  . Alcohol use:  Yes    Comment: rarely  . Drug use: No  . Sexual activity: Yes    Birth control/protection: Surgical

## 2018-04-25 NOTE — Progress Notes (Signed)
The patient is still struggling with left shoulder pain for months status post a left shoulder arthroscopy with extensive debridement subacromial decompression.  His rotator cuff is intact.  I provided a steroid injection in the subacromial space back in December.  He is been gone through extensive therapy and just not getting better.  On exam the shoulder still quite stiff and painful.  I went over his arthroscopy pictures.  I do feel that he would benefit from an ultrasound-guided intra-articular steroid injection with my partner Dr. Junius Roads.  He agrees with trying this treatment plan as well.  We will see if he can see Dr. Junius Roads for this specialty care and procedure soon.

## 2018-06-13 ENCOUNTER — Other Ambulatory Visit: Payer: Medicare Other

## 2018-06-13 DIAGNOSIS — E039 Hypothyroidism, unspecified: Secondary | ICD-10-CM | POA: Diagnosis not present

## 2018-06-13 DIAGNOSIS — Z0000 Encounter for general adult medical examination without abnormal findings: Secondary | ICD-10-CM

## 2018-06-13 DIAGNOSIS — R7309 Other abnormal glucose: Secondary | ICD-10-CM

## 2018-06-13 DIAGNOSIS — I10 Essential (primary) hypertension: Secondary | ICD-10-CM

## 2018-06-13 DIAGNOSIS — E785 Hyperlipidemia, unspecified: Secondary | ICD-10-CM

## 2018-06-13 NOTE — Progress Notes (Signed)
Subjective:    Patient ID: Jordan Evans, male    DOB: Oct 02, 1950, 68 y.o.   MRN: [id number]  HPI: 12/13/17 OV: [name] is here for CPE He reports medication compliance, denies SE He has been riding stationary bike >70miles 5 days/week He has sig reduced saturated fat/sugar/CHO He has lost >13 lbs in 3 months Home BP readings: SBP 100-130, mean 110 DBP 60-80, mean 70 HR 50s-60  L shoulder surgery scheduled in 2 weeks to address torn rotator cuff and bone spurs. He continues to abstain from tobacco and seldom drinks ETOH  Healthcare Maintenance: Colonoscopy-UTD, due 2028, provided stool cards. Immunizations-decined Zoster and Pneumococcal  LDCT- No smoking hx AAA Screening-completed 06/2017- aortic atherosclerosis w/o aneurysm.  06/19/2018 OV: [name] is here for 6 month f/u: HTN, hypothyroidism, HLD He increased Amlodipine from 2.5mg  to 5mg  QD Home readings0 SBP 120-130s DBP 80-90s Reviewed Recent Labs: CBC-WNL CMP-WNL A1c-WNL, 5.4, down from 5.6 last year- good! The 10-year ASCVD risk score [name] DC Jr., et al., 2013) is: 11.6%   Values used to calculate the score:     Age: 24 years     Sex: Male     Is Non-Hispanic African American: No     Diabetic: No     Tobacco smoker: No     Systolic Blood Pressure: 709 mmHg     Is BP treated: Yes     HDL Cholesterol: 59 mg/dL     Total Cholesterol: 196 mg/dL LDL-112 He has only been taking Atorvastatin 20mg  every other night- advised to take nightly Resume heart healthy diet and regular exercise He continues to abstain from tobacco/vape/ETOH use He continues to experience L shoulder pain despite surgery, formal PT, and several cortisone injections- followed by Orthopedic Specialist  Patient Care Team    Relationship Specialty Notifications Start End  Mina Marble D, NP PCP - General Family Medicine  08/31/16   Mcarthur Rossetti, [place] Physician Orthopedic Surgery  04/25/18     Patient Active Problem List    Diagnosis Date Noted  . BMI 26.0-26.9,adult 12/13/2017  . Irregular heartbeat 09/08/2017  . Encounter for screening fecal occult blood testing 08/25/2017  . Acute bronchitis 07/06/2017  . Screening for AAA (abdominal aortic aneurysm) 06/16/2017  . Hypothyroidism 06/16/2017  . Dizziness 03/17/2017  . Vasovagal syncope 03/17/2017  . Yeast infection 12/14/2016  . H/O multiple pulmonary nodules 12/14/2016  . Health care maintenance 08/31/2016  . Kidney calculi 08/31/2016  . Hernia of abdominal cavity 08/31/2016  . HTN (hypertension) 08/31/2016  . Hyperlipidemia 08/31/2016     Past Medical History:  Diagnosis Date  . History of kidney stones   . Hyperlipidemia   . Hypertension   . Hypothyroidism   . Syncope   . Thyroid disease      Past Surgical History:  Procedure Laterality Date  . EXTRACORPOREAL SHOCK WAVE LITHOTRIPSY Right 01/24/2017   Procedure: EXTRACORPOREAL SHOCK WAVE LITHOTRIPSY (ESWL);  Surgeon: Nickie Retort, MD;  Location: [facility];  Service: Urology;  Laterality: Right;  . HERNIA REPAIR     umbilical hernia- [id number]  . LITHOTRIPSY    . ROTATOR CUFF REPAIR Right   . URETHRAL DILATION     x5   . VASECTOMY       Family History  Problem Relation Age of Onset  . Cancer Mother        ovarian  . Hyperlipidemia Mother   . Hypertension Mother   . Heart attack Father   .  Hyperlipidemia Father   . Hypertension Father   . Hyperlipidemia Sister   . Hypertension Sister   . Cancer Brother        lymph  . Hyperlipidemia Brother   . Hypertension Brother   . Diabetes Paternal Grandmother   . Hyperlipidemia Brother   . Hypertension Brother   . Hyperlipidemia Brother   . Hypertension Brother   . Hyperlipidemia Brother   . Hypertension Brother      Social History   Substance and Sexual Activity  Drug Use No     Social History   Substance and Sexual Activity  Alcohol Use Yes   Comment: rarely     Social History   Tobacco Use  Smoking Status  Never Smoker  Smokeless Tobacco Never Used     Outpatient Encounter Medications as of 06/19/2018  Medication Sig Note  . amLODipine (NORVASC) 5 MG tablet 1 tablet daily   . atorvastatin (LIPITOR) 20 MG tablet TAKE 1 TABLET BY MOUTH EVERY DAY   . co-enzyme Q-10 50 MG capsule Take 50 mg by mouth daily.   Marland Kitchen levothyroxine (SYNTHROID, LEVOTHROID) 75 MCG tablet TAKE 1 TABLET BY MOUTH DAILY BEFORE BREAKFAST.   Marland Kitchen triamcinolone ointment (KENALOG) 0.1 % Apply 1 application topically as needed.   . [DISCONTINUED] amLODipine (NORVASC) 2.5 MG tablet 1 tablet daily 03/23/2018: Now taking 5 mg/day  . [DISCONTINUED] levothyroxine (SYNTHROID, LEVOTHROID) 75 MCG tablet TAKE 1 TABLET BY MOUTH DAILY BEFORE BREAKFAST.    Facility-Administered Encounter Medications as of 06/19/2018  Medication  . methylPREDNISolone acetate (DEPO-MEDROL) injection 40 mg    Allergies: Percocet [oxycodone-acetaminophen]  Body mass index is 28.34 kg/m.  Blood pressure 123/80, pulse (!) 54, temperature 98.1 F (36.7 C), temperature source Oral, height 5\' 10"  (1.778 m), weight 197 lb 8 oz (89.6 kg), SpO2 97 %.   Review of Systems  Constitutional: Negative for activity change, appetite change, chills, diaphoresis, fatigue, fever and unexpected weight change.  Eyes: Negative for visual disturbance.  Respiratory: Negative for cough, chest tightness, shortness of breath, wheezing and stridor.   Cardiovascular: Negative for chest pain, palpitations and leg swelling.  Gastrointestinal: Negative for abdominal distention, abdominal pain, blood in stool, constipation, diarrhea and nausea.  Endocrine: Negative for cold intolerance, heat intolerance, polydipsia, polyphagia and polyuria.  Genitourinary: Negative for difficulty urinating and flank pain.  Musculoskeletal: Positive for arthralgias and myalgias. Negative for back pain, gait problem, joint swelling, neck pain and neck stiffness.  Skin: Negative for color change, pallor,  rash and wound.  Neurological: Negative for dizziness and headaches.  Hematological: Does not bruise/bleed easily.  Psychiatric/Behavioral: Negative for confusion, decreased concentration, dysphoric mood, hallucinations, self-injury, sleep disturbance and suicidal ideas. The patient is not nervous/anxious and is not hyperactive.        Objective:   Physical Exam Vitals signs and nursing note reviewed.  Constitutional:      General: He is not in acute distress.    Appearance: He is well-developed. He is not diaphoretic.  HENT:     Head: Normocephalic and atraumatic.     Right Ear: External ear normal. No decreased hearing noted. Tympanic membrane is not erythematous or bulging.     Left Ear: External ear normal. No decreased hearing noted. Tympanic membrane is not erythematous or bulging.     Nose: Nose normal. No mucosal edema or rhinorrhea.     Right Sinus: No maxillary sinus tenderness or frontal sinus tenderness.     Left Sinus: No maxillary sinus tenderness or frontal  sinus tenderness.     Mouth/Throat:     Pharynx: Uvula midline.     Tonsils: Swelling: 0 on the right. 0 on the left.  Eyes:     Conjunctiva/sclera: Conjunctivae normal.     Pupils: Pupils are equal, round, and reactive to light.  Neck:     Musculoskeletal: Normal range of motion and neck supple.  Cardiovascular:     Rate and Rhythm: Normal rate and regular rhythm.     Heart sounds: Normal heart sounds. No murmur.  Pulmonary:     Effort: Pulmonary effort is normal. No respiratory distress.     Breath sounds: Normal breath sounds. No stridor. No wheezing or rales.  Chest:     Chest wall: No tenderness.  Abdominal:     Palpations: Abdomen is not rigid.  Musculoskeletal:        General: No tenderness.     Right shoulder: Normal.     Left shoulder: He exhibits decreased range of motion.     Left elbow: Normal.  Lymphadenopathy:     Cervical: No cervical adenopathy.  Skin:    General: Skin is warm and dry.      Capillary Refill: Capillary refill takes less than 2 seconds.     Coloration: Skin is not pale.     Findings: No erythema or rash.  Neurological:     Mental Status: He is alert and oriented to person, place, and time.     Coordination: Coordination normal.  Psychiatric:        Mood and Affect: Mood normal.        Behavior: Behavior normal.        Thought Content: Thought content normal.        Judgment: Judgment normal.       Assessment & Plan:   1. Hypertension, unspecified type   2. Hypothyroidism, unspecified type   3. Health care maintenance     HTN (hypertension) BP at goal 123/80, HR 54 Recently increased Amlodipine from 2.5mg  to 5mg  QD Increase regular exercise  Hypothyroidism 06/2018 TSH- 3.130 Continue Levothyroxine 38mcg QD  Health care maintenance Continue all medications as directed- please take Atorvastatin 20mg  EACH night. Continue to remain well hydrated. Follow Heart Healthy diet Increase regular exercise.  Recommend at least 30 minutes daily, 5 days per week of walking, jogging, biking, swimming, YouTube/Pinterest workout videos. Recommend complete physical in 6 months.    FOLLOW-UP:  Return in about 6 months (around 12/20/2018) for Regular Follow Up, HTN.

## 2018-06-14 LAB — COMPREHENSIVE METABOLIC PANEL
ALBUMIN: 4.3 g/dL (ref 3.8–4.8)
ALT: 21 IU/L (ref 0–44)
AST: 20 IU/L (ref 0–40)
Albumin/Globulin Ratio: 1.4 (ref 1.2–2.2)
Alkaline Phosphatase: 100 IU/L (ref 39–117)
BUN/Creatinine Ratio: 13 (ref 10–24)
BUN: 11 mg/dL (ref 8–27)
Bilirubin Total: 0.5 mg/dL (ref 0.0–1.2)
CO2: 24 mmol/L (ref 20–29)
CREATININE: 0.88 mg/dL (ref 0.76–1.27)
Calcium: 9 mg/dL (ref 8.6–10.2)
Chloride: 104 mmol/L (ref 96–106)
GFR calc Af Amer: 103 mL/min/{1.73_m2} (ref >59)
GFR calc non Af Amer: 90 mL/min/{1.73_m2} (ref >59)
GLUCOSE: 96 mg/dL (ref 65–99)
Globulin, Total: 3 g/dL (ref 1.5–4.5)
Potassium: 4.7 mmol/L (ref 3.5–5.2)
Sodium: 143 mmol/L (ref 134–144)
Total Protein: 7.3 g/dL (ref 6.0–8.5)

## 2018-06-14 LAB — LIPID PANEL
CHOLESTEROL TOTAL: 196 mg/dL (ref 100–199)
Chol/HDL Ratio: 3.3 ratio (ref 0.0–5.0)
HDL: 59 mg/dL (ref >39)
LDL Calculated: 112 mg/dL — ABNORMAL HIGH (ref 0–99)
TRIGLYCERIDES: 124 mg/dL (ref 0–149)
VLDL Cholesterol Cal: 25 mg/dL (ref 5–40)

## 2018-06-14 LAB — CBC WITH DIFFERENTIAL/PLATELET
BASOS ABS: 0.1 10*3/uL (ref 0.0–0.2)
Basos: 1 %
EOS (ABSOLUTE): 0.2 10*3/uL (ref 0.0–0.4)
Eos: 4 %
HEMOGLOBIN: 15.4 g/dL (ref 13.0–17.7)
Hematocrit: 46.4 % (ref 37.5–51.0)
IMMATURE GRANULOCYTES: 0 %
Immature Grans (Abs): 0 10*3/uL (ref 0.0–0.1)
LYMPHS ABS: 1.5 10*3/uL (ref 0.7–3.1)
Lymphs: 25 %
MCH: 29.2 pg (ref 26.6–33.0)
MCHC: 33.2 g/dL (ref 31.5–35.7)
MCV: 88 fL (ref 79–97)
MONOCYTES: 7 %
Monocytes Absolute: 0.4 10*3/uL (ref 0.1–0.9)
NEUTROS PCT: 63 %
Neutrophils Absolute: 3.6 10*3/uL (ref 1.4–7.0)
Platelets: 232 10*3/uL (ref 150–450)
RBC: 5.28 x10E6/uL (ref 4.14–5.80)
RDW: 12.6 % (ref 11.6–15.4)
WBC: 5.8 10*3/uL (ref 3.4–10.8)

## 2018-06-14 LAB — TSH: TSH: 3.13 u[IU]/mL (ref 0.450–4.500)

## 2018-06-14 LAB — HEMOGLOBIN A1C
ESTIMATED AVERAGE GLUCOSE: 108 mg/dL
HEMOGLOBIN A1C: 5.4 % (ref 4.8–5.6)

## 2018-06-19 ENCOUNTER — Encounter: Payer: Self-pay | Admitting: Adult Health

## 2018-06-19 ENCOUNTER — Ambulatory Visit (INDEPENDENT_AMBULATORY_CARE_PROVIDER_SITE_OTHER): Payer: Medicare Other | Admitting: Adult Health

## 2018-06-19 ENCOUNTER — Other Ambulatory Visit: Payer: Self-pay

## 2018-06-19 DIAGNOSIS — E039 Hypothyroidism, unspecified: Secondary | ICD-10-CM | POA: Diagnosis not present

## 2018-06-19 DIAGNOSIS — I10 Essential (primary) hypertension: Secondary | ICD-10-CM

## 2018-06-19 DIAGNOSIS — Z0000 Encounter for general adult medical examination without abnormal findings: Secondary | ICD-10-CM | POA: Diagnosis not present

## 2018-06-19 MED ORDER — LEVOTHYROXINE SODIUM 75 MCG PO TABS: ORAL_TABLET | ORAL | 1 refills | Status: DC

## 2018-06-19 MED ORDER — AMLODIPINE BESYLATE 5 MG PO TABS: ORAL_TABLET | ORAL | 2 refills | Status: DC

## 2018-06-19 NOTE — Assessment & Plan Note (Signed)
BP at goal 123/80, HR 54 Recently increased Amlodipine from 2.5mg  to 5mg  QD Increase regular exercise

## 2018-06-19 NOTE — Assessment & Plan Note (Signed)
Continue all medications as directed- please take Atorvastatin 20mg  EACH night. Continue to remain well hydrated. Follow Heart Healthy diet Increase regular exercise.  Recommend at least 30 minutes daily, 5 days per week of walking, jogging, biking, swimming, YouTube/Pinterest workout videos. Recommend complete physical in 6 months.

## 2018-06-19 NOTE — Assessment & Plan Note (Signed)
06/2018 TSH- 3.130 Continue Levothyroxine 35mcg QD

## 2018-06-19 NOTE — Patient Instructions (Addendum)
Managing Your Hypertension Hypertension is commonly called high blood pressure. This is when the force of your blood pressing against the walls of your arteries is too strong. Arteries are blood vessels that carry blood from your heart throughout your body. Hypertension forces the heart to work harder to pump blood, and may cause the arteries to become narrow or stiff. Having untreated or uncontrolled hypertension can cause heart attack, stroke, kidney disease, and other problems. What are blood pressure readings? A blood pressure reading consists of a higher number over a lower number. Ideally, your blood pressure should be below 120/80. The first ("top") number is called the systolic pressure. It is a measure of the pressure in your arteries as your heart beats. The second ("bottom") number is called the diastolic pressure. It is a measure of the pressure in your arteries as the heart relaxes. What does my blood pressure reading mean? Blood pressure is classified into four stages. Based on your blood pressure reading, your health care provider may use the following stages to determine what type of treatment you need, if any. Systolic pressure and diastolic pressure are measured in a unit called mm Hg. Normal  Systolic pressure: below 784.  Diastolic pressure: below 80. Elevated  Systolic pressure: 696-295.  Diastolic pressure: below 80. Hypertension stage 1  Systolic pressure: 284-132.  Diastolic pressure: 44-01. Hypertension stage 2  Systolic pressure: 027 or above.  Diastolic pressure: 90 or above. What health risks are associated with hypertension? Managing your hypertension is an important responsibility. Uncontrolled hypertension can lead to:  A heart attack.  A stroke.  A weakened blood vessel (aneurysm).  Heart failure.  Kidney damage.  Eye damage.  Metabolic syndrome.  Memory and concentration problems. What changes can I make to manage my hypertension?  Hypertension can be managed by making lifestyle changes and possibly by taking medicines. Your health care provider will help you make a plan to bring your blood pressure within a normal range. Eating and drinking   Eat a diet that is high in fiber and potassium, and low in salt (sodium), added sugar, and fat. An example eating plan is called the DASH (Dietary Approaches to Stop Hypertension) diet. To eat this way: ? Eat plenty of fresh fruits and vegetables. Try to fill half of your plate at each meal with fruits and vegetables. ? Eat whole grains, such as whole wheat pasta, brown rice, or whole grain bread. Fill about one quarter of your plate with whole grains. ? Eat low-fat diary products. ? Avoid fatty cuts of meat, processed or cured meats, and poultry with skin. Fill about one quarter of your plate with lean proteins such as fish, chicken without skin, beans, eggs, and tofu. ? Avoid premade and processed foods. These tend to be higher in sodium, added sugar, and fat.  Reduce your daily sodium intake. Most people with hypertension should eat less than 1,500 mg of sodium a day.  Limit alcohol intake to no more than 1 drink a day for nonpregnant women and 2 drinks a day for men. One drink equals 12 oz of beer, 5 oz of wine, or 1 oz of hard liquor. Lifestyle  Work with your health care provider to maintain a healthy body weight, or to lose weight. Ask what an ideal weight is for you.  Get at least 30 minutes of exercise that causes your heart to beat faster (aerobic exercise) most days of the week. Activities may include walking, swimming, or biking.  Include exercise  to strengthen your muscles (resistance exercise), such as weight lifting, as part of your weekly exercise routine. Try to do these types of exercises for 30 minutes at least 3 days a week.  Do not use any products that contain nicotine or tobacco, such as cigarettes and e-cigarettes. If you need help quitting, ask your health  care provider.  Control any long-term (chronic) conditions you have, such as high cholesterol or diabetes. Monitoring  Monitor your blood pressure at home as told by your health care provider. Your personal target blood pressure may vary depending on your medical conditions, your age, and other factors.  Have your blood pressure checked regularly, as often as told by your health care provider. Working with your health care provider  Review all the medicines you take with your health care provider because there may be side effects or interactions.  Talk with your health care provider about your diet, exercise habits, and other lifestyle factors that may be contributing to hypertension.  Visit your health care provider regularly. Your health care provider can help you create and adjust your plan for managing hypertension. Will I need medicine to control my blood pressure? Your health care provider may prescribe medicine if lifestyle changes are not enough to get your blood pressure under control, and if:  Your systolic blood pressure is 130 or higher.  Your diastolic blood pressure is 80 or higher. Take medicines only as told by your health care provider. Follow the directions carefully. Blood pressure medicines must be taken as prescribed. The medicine does not work as well when you skip doses. Skipping doses also puts you at risk for problems. Contact a health care provider if:  You think you are having a reaction to medicines you have taken.  You have repeated (recurrent) headaches.  You feel dizzy.  You have swelling in your ankles.  You have trouble with your vision. Get help right away if:  You develop a severe headache or confusion.  You have unusual weakness or numbness, or you feel faint.  You have severe pain in your chest or abdomen.  You vomit repeatedly.  You have trouble breathing. Summary  Hypertension is when the force of blood pumping through your arteries  is too strong. If this condition is not controlled, it may put you at risk for serious complications.  Your personal target blood pressure may vary depending on your medical conditions, your age, and other factors. For most people, a normal blood pressure is less than 120/80.  Hypertension is managed by lifestyle changes, medicines, or both. Lifestyle changes include weight loss, eating a healthy, low-sodium diet, exercising more, and limiting alcohol. This information is not intended to replace advice given to you by your health care provider. Make sure you discuss any questions you have with your health care provider. Document Released: 12/15/2011 Document Revised: 02/18/2016 Document Reviewed: 02/18/2016 Elsevier Interactive Patient Education  2019 [place] all medications as directed- please take Atorvastatin 20mg  EACH night. Continue to remain well hydrated. Follow Heart Healthy diet Increase regular exercise.  Recommend at least 30 minutes daily, 5 days per week of walking, jogging, biking, swimming, YouTube/Pinterest workout videos. Recommend complete physical in 6 months. GREAT TO SEE YOU!

## 2018-09-14 ENCOUNTER — Telehealth: Payer: Self-pay

## 2018-09-14 NOTE — Telephone Encounter (Signed)
Spoke with the patient to offer him a mychart visit with Amy. He was last seen by Dr. Leta Baptist 03/2017. Patient declined wanting to schedule an appt.

## 2018-09-20 DIAGNOSIS — N480 Leukoplakia of penis: Secondary | ICD-10-CM | POA: Diagnosis not present

## 2018-09-20 DIAGNOSIS — N35819 Other urethral stricture, male, unspecified site: Secondary | ICD-10-CM | POA: Diagnosis not present

## 2018-09-20 DIAGNOSIS — N481 Balanitis: Secondary | ICD-10-CM | POA: Diagnosis not present

## 2018-09-20 DIAGNOSIS — Z87442 Personal history of urinary calculi: Secondary | ICD-10-CM | POA: Diagnosis not present

## 2018-10-09 ENCOUNTER — Ambulatory Visit (INDEPENDENT_AMBULATORY_CARE_PROVIDER_SITE_OTHER): Payer: Medicare Other | Admitting: Adult Health

## 2018-10-09 ENCOUNTER — Other Ambulatory Visit: Payer: Self-pay

## 2018-10-09 ENCOUNTER — Encounter: Payer: Self-pay | Admitting: Adult Health

## 2018-10-09 VITALS — BP 158/68 | HR 71 | Temp 98.5°F | Ht 70.0 in | Wt 199.5 lb

## 2018-10-09 DIAGNOSIS — I10 Essential (primary) hypertension: Secondary | ICD-10-CM | POA: Diagnosis not present

## 2018-10-09 DIAGNOSIS — H3562 Retinal hemorrhage, left eye: Secondary | ICD-10-CM | POA: Diagnosis not present

## 2018-10-09 LAB — POCT GLYCOSYLATED HEMOGLOBIN (HGB A1C): Hemoglobin A1C: 5.4 % (ref 4.0–5.6)

## 2018-10-09 MED ORDER — AMLODIPINE BESYLATE 10 MG PO TABS: ORAL_TABLET | ORAL | 0 refills | Status: DC

## 2018-10-09 NOTE — Assessment & Plan Note (Signed)
Increased Amlodipine from 5mg  to 10mg  once daily. Continue to check your blood pressure and heart rate once daily- record. Do not over-exert yourself until follow-up in 2 weeks (TeleMedicine appt). We will call you when lab results are available. A1c today is normal- 5.4 EKG- normal sinus rhythm. Follow heart healthy diet. Continue to social distance and wear a mask when out in public.

## 2018-10-09 NOTE — Progress Notes (Signed)
Subjective:    Patient ID: Jordan Evans, male    DOB: 1950/10/29, 68 y.o.   MRN: [id number]  HPI:  Jordan Evans presents with elevated BP He had dilated eye examination today and the Ophthalmologist dx'd with him L retinal hemorrhage. He reports chronic dry eye and "floaters", no acute change in vision. Floaters have been presents >10 years. He reports that his SBP at the Ophthalmologist and at the Urologist were both quite elevated in the 170s He is currently taking Amlodipine 5mg  QD He reports ambulatory BP readings- SBP 130s DBP 80-90 06/19/2018 OV BP 123/80, HR 54 12/13/17 OV BP 113/66, HR 58 He denies CP/chest pressure/dyspnea/palpitations TSH- March 2020: 3.130 He reports 2 HAs/month that will last "for about a day", pain localized to "top of my head". He denies CP/dyspnea/chest pressure/palpitations He reports occasional dizziness with position changes He estimates to drink 60 oz water/day Due to pandemic and having to stay with his son intermittently - he has not been biking or eating healthy- Current wt 199 Body mass index is 28.63 kg/m.  Lab Results  Component Value Date   HGBA1C 5.4 06/13/2018   HGBA1C 5.6 06/16/2017   Patient Care Team    Relationship Specialty Notifications Start End  Mina Marble D, NP PCP - General Family Medicine  08/31/16   Mcarthur Rossetti, MD Consulting Physician Orthopedic Surgery  04/25/18     Patient Active Problem List   Diagnosis Date Noted  . BMI 26.0-26.9,adult 12/13/2017  . Irregular heartbeat 09/08/2017  . Encounter for screening fecal occult blood testing 08/25/2017  . Acute bronchitis 07/06/2017  . Screening for AAA (abdominal aortic aneurysm) 06/16/2017  . Hypothyroidism 06/16/2017  . Dizziness 03/17/2017  . Vasovagal syncope 03/17/2017  . Yeast infection 12/14/2016  . H/O multiple pulmonary nodules 12/14/2016  . Health care maintenance 08/31/2016  . Kidney calculi 08/31/2016  . Hernia of abdominal cavity  08/31/2016  . HTN (hypertension) 08/31/2016  . Hyperlipidemia 08/31/2016     Past Medical History:  Diagnosis Date  . History of kidney stones   . Hyperlipidemia   . Hypertension   . Hypothyroidism   . Syncope   . Thyroid disease      Past Surgical History:  Procedure Laterality Date  . EXTRACORPOREAL SHOCK WAVE LITHOTRIPSY Right 01/24/2017   Procedure: EXTRACORPOREAL SHOCK WAVE LITHOTRIPSY (ESWL);  Surgeon: Nickie Retort, MD;  Location: [facility];  Service: Urology;  Laterality: Right;  . HERNIA REPAIR     umbilical hernia- [id number]  . LITHOTRIPSY    . ROTATOR CUFF REPAIR Right   . URETHRAL DILATION     x5   . VASECTOMY       Family History  Problem Relation Age of Onset  . Cancer Mother        ovarian  . Hyperlipidemia Mother   . Hypertension Mother   . Heart attack Father   . Hyperlipidemia Father   . Hypertension Father   . Hyperlipidemia Sister   . Hypertension Sister   . Cancer Brother        lymph  . Hyperlipidemia Brother   . Hypertension Brother   . Diabetes Paternal Grandmother   . Hyperlipidemia Brother   . Hypertension Brother   . Hyperlipidemia Brother   . Hypertension Brother   . Hyperlipidemia Brother   . Hypertension Brother      Social History   Substance and Sexual Activity  Drug Use No     Social History   Substance  and Sexual Activity  Alcohol Use Yes   Comment: rarely     Social History   Tobacco Use  Smoking Status Never Smoker  Smokeless Tobacco Never Used     Outpatient Encounter Medications as of 10/09/2018  Medication Sig  . amLODipine (NORVASC) 10 MG tablet 1 tablet daily  . atorvastatin (LIPITOR) 20 MG tablet TAKE 1 TABLET BY MOUTH EVERY DAY  . co-enzyme Q-10 50 MG capsule Take 50 mg by mouth daily.  Marland Kitchen levothyroxine (SYNTHROID, LEVOTHROID) 75 MCG tablet TAKE 1 TABLET BY MOUTH DAILY BEFORE BREAKFAST.  Marland Kitchen triamcinolone ointment (KENALOG) 0.1 % Apply 1 application topically as needed.  . [DISCONTINUED]  amLODipine (NORVASC) 5 MG tablet 1 tablet daily   Facility-Administered Encounter Medications as of 10/09/2018  Medication  . methylPREDNISolone acetate (DEPO-MEDROL) injection 40 mg    Allergies: Percocet [oxycodone-acetaminophen]  Body mass index is 28.63 kg/m.  Blood pressure (!) 158/68, pulse 71, temperature 98.5 F (36.9 C), temperature source Oral, height 5\' 10"  (1.778 m), weight 199 lb 8 oz (90.5 kg), SpO2 97 %.  Review of Systems  Constitutional: Positive for fatigue. Negative for activity change, appetite change, chills, diaphoresis, fever and unexpected weight change.  HENT: Negative for congestion.   Eyes: Positive for visual disturbance.  Respiratory: Negative for cough, chest tightness, shortness of breath, wheezing and stridor.   Cardiovascular: Negative for chest pain, palpitations and leg swelling.  Gastrointestinal: Negative for abdominal distention, anal bleeding, blood in stool, constipation, diarrhea, nausea and vomiting.  Endocrine: Positive for polyuria. Negative for cold intolerance, heat intolerance, polydipsia and polyphagia.  Genitourinary: Positive for difficulty urinating and frequency.  Skin: Negative for color change, pallor, rash and wound.  Neurological: Positive for headaches. Negative for dizziness.  Hematological: Negative for adenopathy. Does not bruise/bleed easily.  Psychiatric/Behavioral: Positive for agitation and behavioral problems. Negative for confusion, decreased concentration, dysphoric mood, hallucinations, self-injury, sleep disturbance and suicidal ideas. The patient is not nervous/anxious and is not hyperactive.        Objective:   Physical Exam Vitals signs and nursing note reviewed.  Constitutional:      General: He is not in acute distress.    Appearance: He is not ill-appearing or toxic-appearing.  HENT:     Head: Normocephalic and atraumatic.  Eyes:     Conjunctiva/sclera: Conjunctivae normal.     Comments: Bil pupils-  dilated  Cardiovascular:     Rate and Rhythm: Normal rate.     Pulses: Normal pulses.     Heart sounds: Normal heart sounds. No murmur. No friction rub. No gallop.   Pulmonary:     Effort: Pulmonary effort is normal. No respiratory distress.     Breath sounds: Normal breath sounds. No stridor. No wheezing, rhonchi or rales.  Chest:     Chest wall: No tenderness.  Skin:    Capillary Refill: Capillary refill takes more than 3 seconds.  Neurological:     Mental Status: He is alert and oriented to person, place, and time.  Psychiatric:        Mood and Affect: Mood normal.        Behavior: Behavior normal.        Thought Content: Thought content normal.        Judgment: Judgment normal.        Assessment & Plan:   1. Hypertension, unspecified type   2. Retinal hemorrhage of left eye     HTN (hypertension) Increased Amlodipine from 5mg  to 10mg  once daily. Continue to  check your blood pressure and heart rate once daily- record. Do not over-exert yourself until follow-up in 2 weeks (TeleMedicine appt). We will call you when lab results are available. A1c today is normal- 5.4 EKG- normal sinus rhythm. Follow heart healthy diet. Continue to social distance and wear a mask when out in public.    FOLLOW-UP:  Return in about 2 years (around 10/08/2020) for Regular Follow Up, HTN, TeleMedicine Follow-Up.

## 2018-10-09 NOTE — Patient Instructions (Signed)
Managing Your Hypertension Hypertension is commonly called high blood pressure. This is when the force of your blood pressing against the walls of your arteries is too strong. Arteries are blood vessels that carry blood from your heart throughout your body. Hypertension forces the heart to work harder to pump blood, and may cause the arteries to become narrow or stiff. Having untreated or uncontrolled hypertension can cause heart attack, stroke, kidney disease, and other problems. What are blood pressure readings? A blood pressure reading consists of a higher number over a lower number. Ideally, your blood pressure should be below 120/80. The first ("top") number is called the systolic pressure. It is a measure of the pressure in your arteries as your heart beats. The second ("bottom") number is called the diastolic pressure. It is a measure of the pressure in your arteries as the heart relaxes. What does my blood pressure reading mean? Blood pressure is classified into four stages. Based on your blood pressure reading, your health care provider may use the following stages to determine what type of treatment you need, if any. Systolic pressure and diastolic pressure are measured in a unit called mm Hg. Normal  Systolic pressure: below 784.  Diastolic pressure: below 80. Elevated  Systolic pressure: 696-295.  Diastolic pressure: below 80. Hypertension stage 1  Systolic pressure: 284-132.  Diastolic pressure: 44-01. Hypertension stage 2  Systolic pressure: 027 or above.  Diastolic pressure: 90 or above. What health risks are associated with hypertension? Managing your hypertension is an important responsibility. Uncontrolled hypertension can lead to:  A heart attack.  A stroke.  A weakened blood vessel (aneurysm).  Heart failure.  Kidney damage.  Eye damage.  Metabolic syndrome.  Memory and concentration problems. What changes can I make to manage my hypertension?  Hypertension can be managed by making lifestyle changes and possibly by taking medicines. Your health care provider will help you make a plan to bring your blood pressure within a normal range. Eating and drinking   Eat a diet that is high in fiber and potassium, and low in salt (sodium), added sugar, and fat. An example eating plan is called the DASH (Dietary Approaches to Stop Hypertension) diet. To eat this way: ? Eat plenty of fresh fruits and vegetables. Try to fill half of your plate at each meal with fruits and vegetables. ? Eat whole grains, such as whole wheat pasta, brown rice, or whole grain bread. Fill about one quarter of your plate with whole grains. ? Eat low-fat diary products. ? Avoid fatty cuts of meat, processed or cured meats, and poultry with skin. Fill about one quarter of your plate with lean proteins such as fish, chicken without skin, beans, eggs, and tofu. ? Avoid premade and processed foods. These tend to be higher in sodium, added sugar, and fat.  Reduce your daily sodium intake. Most people with hypertension should eat less than 1,500 mg of sodium a day.  Limit alcohol intake to no more than 1 drink a day for nonpregnant women and 2 drinks a day for men. One drink equals 12 oz of beer, 5 oz of wine, or 1 oz of hard liquor. Lifestyle  Work with your health care provider to maintain a healthy body weight, or to lose weight. Ask what an ideal weight is for you.  Get at least 30 minutes of exercise that causes your heart to beat faster (aerobic exercise) most days of the week. Activities may include walking, swimming, or biking.  Include exercise  to strengthen your muscles (resistance exercise), such as weight lifting, as part of your weekly exercise routine. Try to do these types of exercises for 30 minutes at least 3 days a week.  Do not use any products that contain nicotine or tobacco, such as cigarettes and e-cigarettes. If you need help quitting, ask your health  care provider.  Control any long-term (chronic) conditions you have, such as high cholesterol or diabetes. Monitoring  Monitor your blood pressure at home as told by your health care provider. Your personal target blood pressure may vary depending on your medical conditions, your age, and other factors.  Have your blood pressure checked regularly, as often as told by your health care provider. Working with your health care provider  Review all the medicines you take with your health care provider because there may be side effects or interactions.  Talk with your health care provider about your diet, exercise habits, and other lifestyle factors that may be contributing to hypertension.  Visit your health care provider regularly. Your health care provider can help you create and adjust your plan for managing hypertension. Will I need medicine to control my blood pressure? Your health care provider may prescribe medicine if lifestyle changes are not enough to get your blood pressure under control, and if:  Your systolic blood pressure is 130 or higher.  Your diastolic blood pressure is 80 or higher. Take medicines only as told by your health care provider. Follow the directions carefully. Blood pressure medicines must be taken as prescribed. The medicine does not work as well when you skip doses. Skipping doses also puts you at risk for problems. Contact a health care provider if:  You think you are having a reaction to medicines you have taken.  You have repeated (recurrent) headaches.  You feel dizzy.  You have swelling in your ankles.  You have trouble with your vision. Get help right away if:  You develop a severe headache or confusion.  You have unusual weakness or numbness, or you feel faint.  You have severe pain in your chest or abdomen.  You vomit repeatedly.  You have trouble breathing. Summary  Hypertension is when the force of blood pumping through your arteries  is too strong. If this condition is not controlled, it may put you at risk for serious complications.  Your personal target blood pressure may vary depending on your medical conditions, your age, and other factors. For most people, a normal blood pressure is less than 120/80.  Hypertension is managed by lifestyle changes, medicines, or both. Lifestyle changes include weight loss, eating a healthy, low-sodium diet, exercising more, and limiting alcohol. This information is not intended to replace advice given to you by your health care provider. Make sure you discuss any questions you have with your health care provider. Document Released: 12/15/2011 Document Revised: 07/14/2018 Document Reviewed: 02/18/2016 Elsevier Patient Education  [place].  Increased Amlodipine from 5mg  to 10mg  once daily. Continue to check your blood pressure and heart rate once daily- record. Do not over-exert yourself until follow-up in 2 weeks (TeleMedicine appt). We will call you when lab results are available. A1c today is normal- 5.4 EKG- normal sinus rhythm. Follow heart healthy diet. Continue to social distance and wear a mask when out in public. GREAT TO SEE YOU!

## 2018-10-10 ENCOUNTER — Encounter: Payer: Self-pay | Admitting: Adult Health

## 2018-10-10 LAB — COMPREHENSIVE METABOLIC PANEL
ALT: 26 IU/L (ref 0–44)
AST: 23 IU/L (ref 0–40)
Albumin/Globulin Ratio: 1.7 (ref 1.2–2.2)
Albumin: 4.5 g/dL (ref 3.8–4.8)
Alkaline Phosphatase: 99 IU/L (ref 39–117)
BUN/Creatinine Ratio: 11 (ref 10–24)
BUN: 12 mg/dL (ref 8–27)
Bilirubin Total: 0.6 mg/dL (ref 0.0–1.2)
CO2: 26 mmol/L (ref 20–29)
Calcium: 9.7 mg/dL (ref 8.6–10.2)
Chloride: 102 mmol/L (ref 96–106)
Creatinine, Ser: 1.11 mg/dL (ref 0.76–1.27)
GFR calc Af Amer: 80 mL/min/{1.73_m2} (ref >59)
GFR calc non Af Amer: 69 mL/min/{1.73_m2} (ref >59)
Globulin, Total: 2.6 g/dL (ref 1.5–4.5)
Glucose: 94 mg/dL (ref 65–99)
Potassium: 5 mmol/L (ref 3.5–5.2)
Sodium: 144 mmol/L (ref 134–144)
Total Protein: 7.1 g/dL (ref 6.0–8.5)

## 2018-10-10 LAB — CBC WITH DIFFERENTIAL/PLATELET
Basophils Absolute: 0.1 10*3/uL (ref 0.0–0.2)
Basos: 1 %
EOS (ABSOLUTE): 0.3 10*3/uL (ref 0.0–0.4)
Eos: 4 %
Hematocrit: 48.9 % (ref 37.5–51.0)
Hemoglobin: 16 g/dL (ref 13.0–17.7)
Immature Grans (Abs): 0 10*3/uL (ref 0.0–0.1)
Immature Granulocytes: 0 %
Lymphocytes Absolute: 1.6 10*3/uL (ref 0.7–3.1)
Lymphs: 22 %
MCH: 28.4 pg (ref 26.6–33.0)
MCHC: 32.7 g/dL (ref 31.5–35.7)
MCV: 87 fL (ref 79–97)
Monocytes Absolute: 0.6 10*3/uL (ref 0.1–0.9)
Monocytes: 9 %
Neutrophils Absolute: 4.5 10*3/uL (ref 1.4–7.0)
Neutrophils: 64 %
Platelets: 263 10*3/uL (ref 150–450)
RBC: 5.63 x10E6/uL (ref 4.14–5.80)
RDW: 12.4 % (ref 11.6–15.4)
WBC: 7.1 10*3/uL (ref 3.4–10.8)

## 2018-10-14 IMAGING — CT CT RENAL STONE PROTOCOL
2 of 4 series · 15 of 46 positions shown, 17 images · non-contrast
Comparison: 01/06/2017

CLINICAL DATA: 66-year-old male with flank pain, recurrent stone
disease suspected.

EXAM:
CT ABDOMEN AND PELVIS WITHOUT CONTRAST
TECHNIQUE: Multidetector CT imaging of the abdomen and pelvis was performed
following the standard protocol without IV contrast.

[Series 3: renal stone 5.0 · axial · 0.70mm/px · z∈[+874,+1289]mm · 12 of 91 slices shown, 14 images]
[im 4/91  soft-tissue]
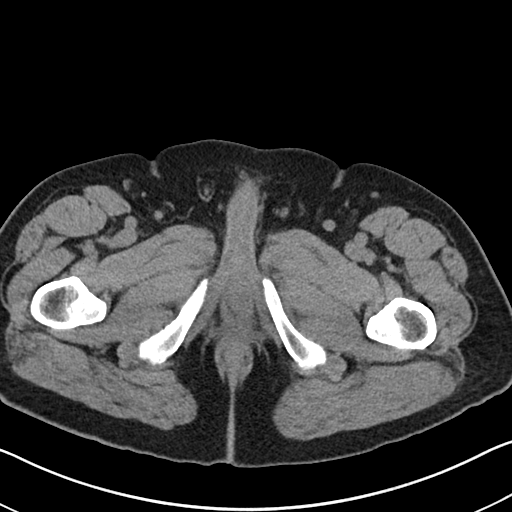
[im 4/91  bone]
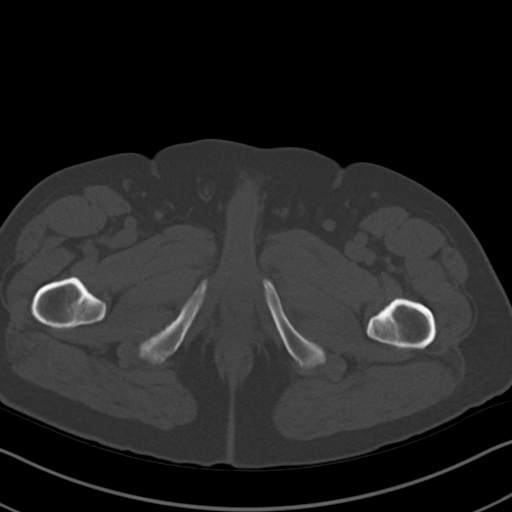
[im 12/91  soft-tissue]
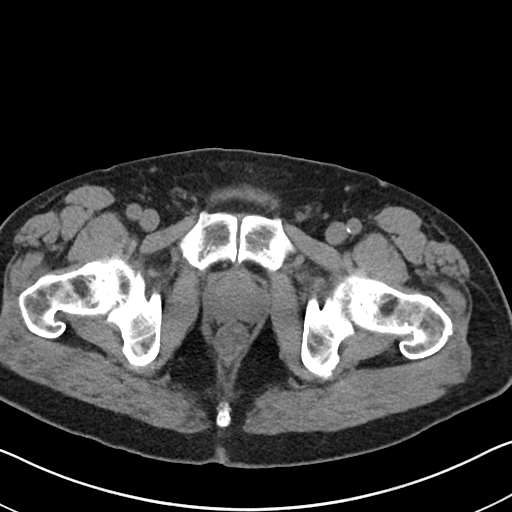
[im 19/91  soft-tissue]
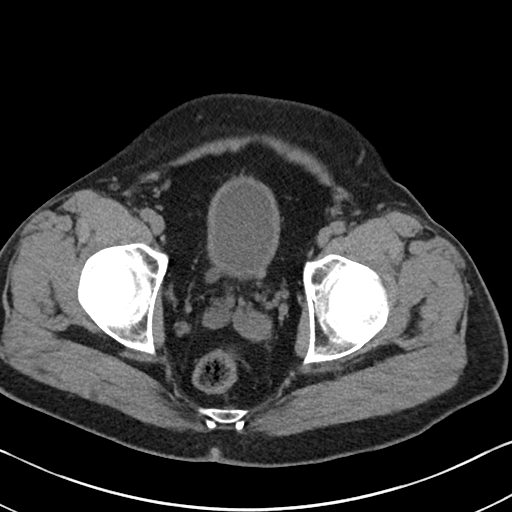
[im 27/91  soft-tissue]
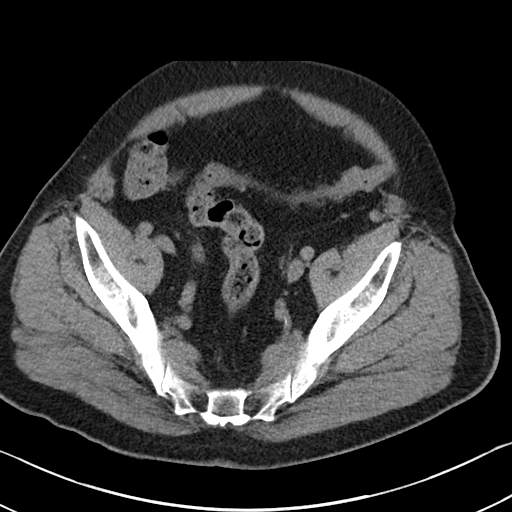
[im 34/91  soft-tissue]
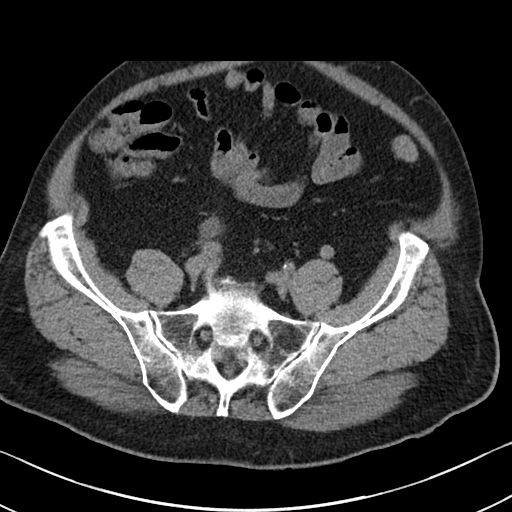
[im 42/91  soft-tissue]
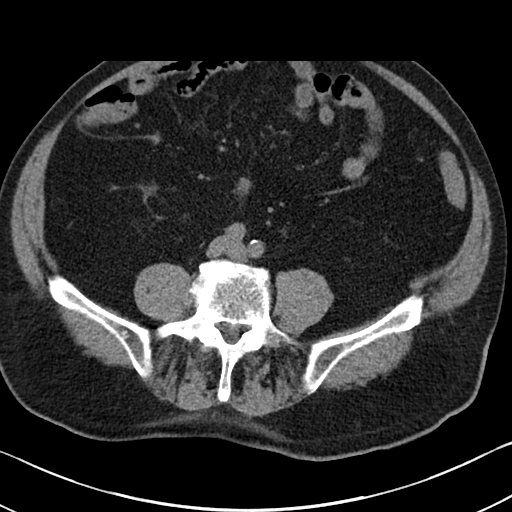
[im 49/91  soft-tissue]
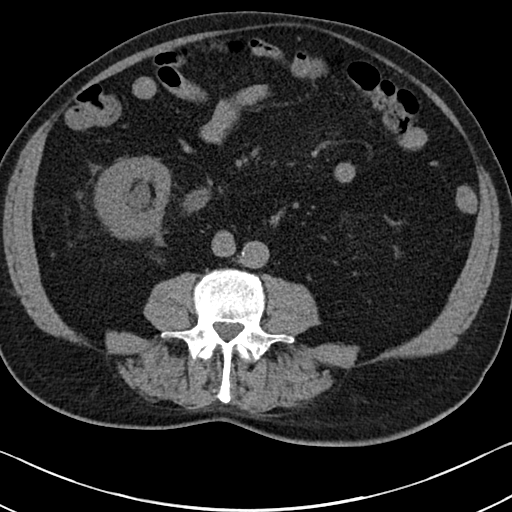
[im 57/91  soft-tissue]
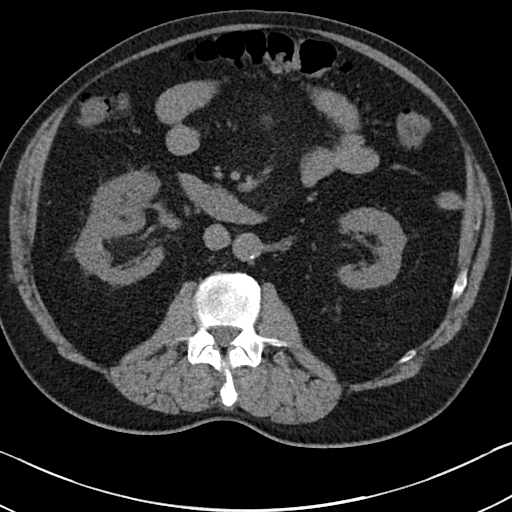
[im 64/91  soft-tissue]
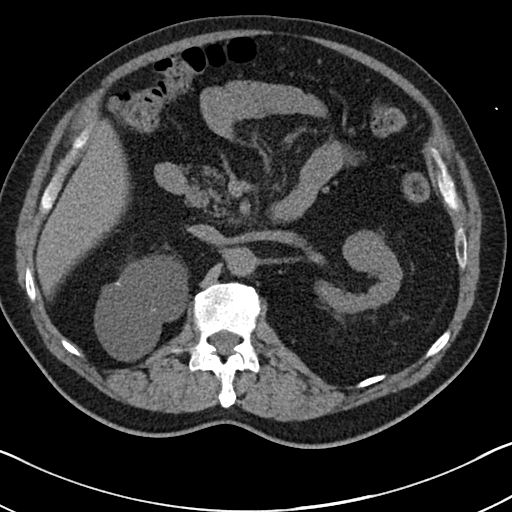
[im 64/91  bone]
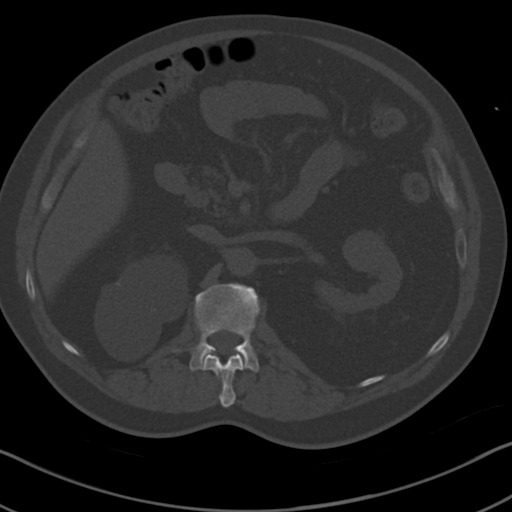
[im 72/91  soft-tissue]
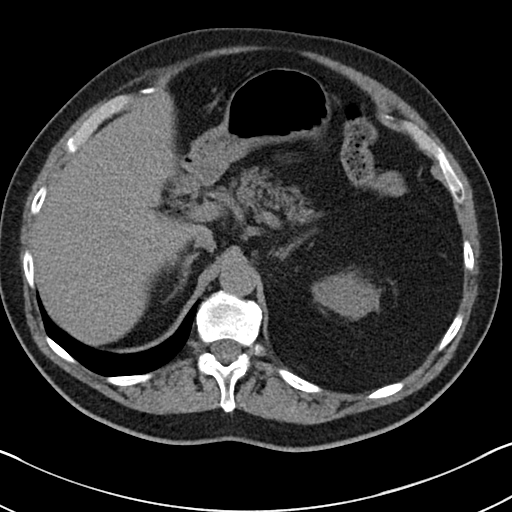
[im 79/91  soft-tissue]
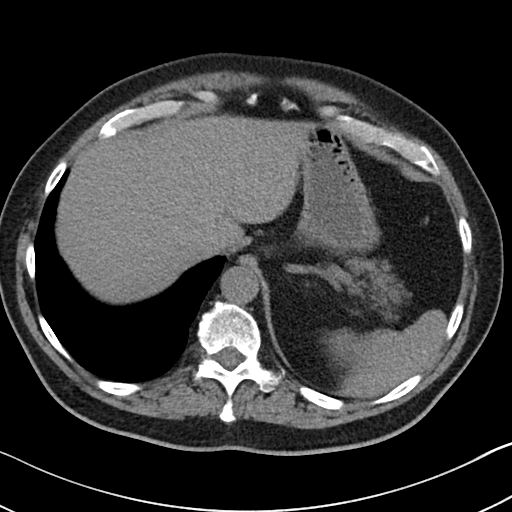
[im 87/91  soft-tissue]
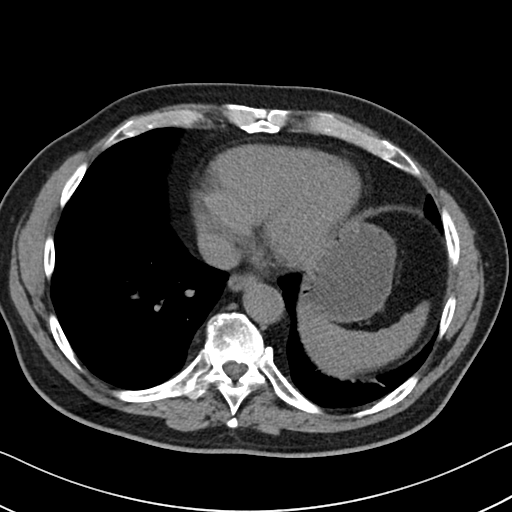

[Series 5: renal stone 3.0 cor · coronal · 0.73mm/px · 3 of 101 slices shown]
[im 34/101  soft-tissue]
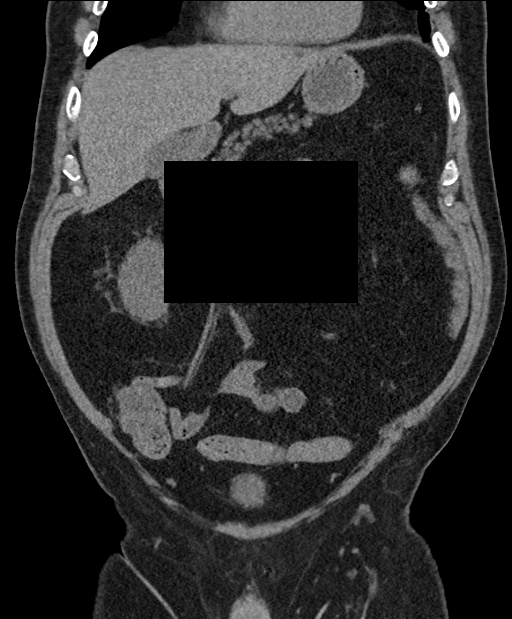
[im 45/101  soft-tissue]
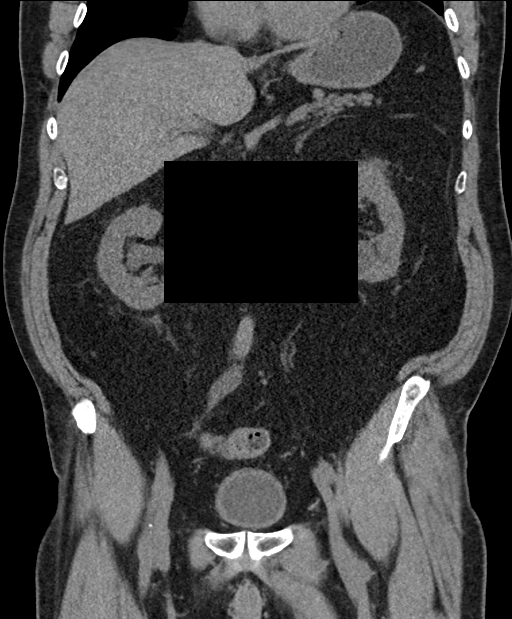
[im 56/101  soft-tissue]
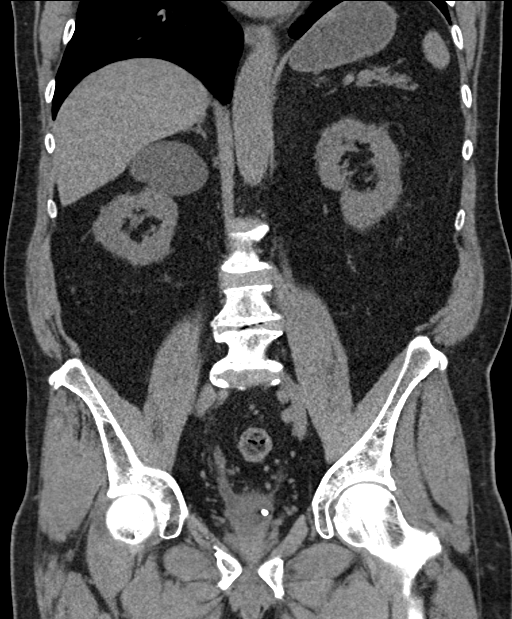

[15 of 46 positions shown; findings below may reference images not displayed]

FINDINGS: Lower chest: No acute abnormality.

Hepatobiliary: Stable 8 mm hypodensity in segment 2. No other focal
liver abnormality is seen. No gallstones, gallbladder wall
thickening, or biliary dilatation.

Pancreas: Unremarkable. No pancreatic ductal dilatation or
surrounding inflammatory changes.

Spleen: Normal in size without focal abnormality.

Adrenals/Urinary Tract: Adrenal glands are unremarkable. A
multilobulated cystic lesion with faint central calcifications along
internal septation is again noted at the right upper pole. It is
stable from most recent comparison study, measuring 8 x 6 cm. A 2 cm
hypodensity along the lower left renal hilum is also grossly
unchanged.

There is mild right-sided hydronephrosis and hydroureter, new from
the prior study. There is mild associated stranding and wall
thickening. Two 4 and 6 mm stones are dependently located within the
bladder. The the appearance is most likely related to recent passage
of these stones. No other renal or ureteral calculi are noted. There
is no hydronephrosis on the left.

Stomach/Bowel: Stomach is within normal limits. Appendix appears
normal. No evidence of bowel wall thickening, distention, or
inflammatory changes.

Vascular/Lymphatic: Aortic atherosclerosis. No enlarged abdominal or
pelvic lymph nodes.

Reproductive: Prostate is unremarkable.

Other: No abdominal wall hernia or abnormality. No abdominopelvic
ascites.

Musculoskeletal: Stable degenerative changes are noted of the lower
lumbar spine. No acute osseous abnormality.
IMPRESSION: 1. Two 4 and 6 mm stones dependently located within the bladder.
There is mild right-sided hydronephrosis and hydroureter with
associated stranding and wall thickening. The appearance is most
likely related to recent passage of the stones. No other renal or
ureteral calculi are noted.
2. Multi-cystic mass at the upper pole of the right kidney, stable
compared to most recent study from Tuesday January, 2017. Again, Bosniak
classification cannot be definitively determined due to lack of IV
contrast, but this is at least a category 2 cyst.
3. Stable hypodensity along the lower left renal hilum, likely
representing a parapelvic cyst.
4. Stable hypodensity within segment 2 of the liver, too small to
definitively characterize but likely representing a cyst.
5. Aortic atherosclerosis.
6. Other stable chronic findings as detailed.

## 2018-10-22 NOTE — Progress Notes (Signed)
Virtual Visit via Telephone Note  I connected with Jordan Evans on 10/24/2018 at  9:45 AM EDT by telephone and verified that I am speaking with the correct person using two identifiers.  Location: Patient: Home Provider: In Clinic   I discussed the limitations, risks, security and privacy concerns of performing an evaluation and management service by telephone and the availability of in person appointments. I also discussed with the patient that there may be a patient responsible charge related to this service. The patient expressed understanding and agreed to proceed.   History of Present Illness: 10/09/2018 OV: HPI:  Jordan Evans presents with elevated BP He had dilated eye examination today and the Ophthalmologist dx'd with him L retinal hemorrhage. He reports chronic dry eye and "floaters", no acute change in vision. Floaters have been presents >10 years. He reports that his SBP at the Ophthalmologist and at the Urologist were both quite elevated in the 170s He is currently taking Amlodipine 5mg  QD He reports ambulatory BP readings- SBP 130s DBP 80-90 06/19/2018 OV BP 123/80, HR 54 12/13/17 OV BP 113/66, HR 58 He denies CP/chest pressure/dyspnea/palpitations TSH- March 2020: 3.130 He reports 2 HAs/month that will last "for about a day", pain localized to "top of my head". He denies CP/dyspnea/chest pressure/palpitations He reports occasional dizziness with position changes He estimates to drink 60 oz water/day Due to pandemic and having to stay with his son intermittently - he has not been biking or eating healthy- Current wt 199 Body mass index is 28.63 kg/m. 10/24/2018 OV: Jordan Evans calls in in for 2 week f/u: HTN Amlodipine was increased from 5mg  to 10mg  QD two weeks ago Ambulatory Readings- SBP 130-140s DBP 80 He denies CP/dyspnea/dizziness/palpitations He continues to abstain from tobacco/vape/ETOH use He continues to consume take-out food several times/week and eat ice  cream nightly prior to bed.   Patient Care Team    Relationship Specialty Notifications Start End  Mina Marble D, NP PCP - General Family Medicine  08/31/16   Mcarthur Rossetti, MD Consulting Physician Orthopedic Surgery  04/25/18     Patient Active Problem List   Diagnosis Date Noted  . BMI 26.0-26.9,adult 12/13/2017  . Irregular heartbeat 09/08/2017  . Encounter for screening fecal occult blood testing 08/25/2017  . Acute bronchitis 07/06/2017  . Screening for AAA (abdominal aortic aneurysm) 06/16/2017  . Hypothyroidism 06/16/2017  . Dizziness 03/17/2017  . Vasovagal syncope 03/17/2017  . Yeast infection 12/14/2016  . H/O multiple pulmonary nodules 12/14/2016  . Health care maintenance 08/31/2016  . Kidney calculi 08/31/2016  . Hernia of abdominal cavity 08/31/2016  . HTN (hypertension) 08/31/2016  . Hyperlipidemia 08/31/2016     Past Medical History:  Diagnosis Date  . History of kidney stones   . Hyperlipidemia   . Hypertension   . Hypothyroidism   . Syncope   . Thyroid disease      Past Surgical History:  Procedure Laterality Date  . EXTRACORPOREAL SHOCK WAVE LITHOTRIPSY Right 01/24/2017   Procedure: EXTRACORPOREAL SHOCK WAVE LITHOTRIPSY (ESWL);  Surgeon: Nickie Retort, MD;  Location: [facility];  Service: Urology;  Laterality: Right;  . HERNIA REPAIR     umbilical hernia- [id number]  . LITHOTRIPSY    . ROTATOR CUFF REPAIR Right   . URETHRAL DILATION     x5   . VASECTOMY       Family History  Problem Relation Age of Onset  . Cancer Mother        ovarian  .  Hyperlipidemia Mother   . Hypertension Mother   . Heart attack Father   . Hyperlipidemia Father   . Hypertension Father   . Hyperlipidemia Sister   . Hypertension Sister   . Cancer Brother        lymph  . Hyperlipidemia Brother   . Hypertension Brother   . Diabetes Paternal Grandmother   . Hyperlipidemia Brother   . Hypertension Brother   . Hyperlipidemia Brother   . Hypertension  Brother   . Hyperlipidemia Brother   . Hypertension Brother      Social History   Substance and Sexual Activity  Drug Use No     Social History   Substance and Sexual Activity  Alcohol Use Yes   Comment: rarely     Social History   Tobacco Use  Smoking Status Never Smoker  Smokeless Tobacco Never Used     Outpatient Encounter Medications as of 10/24/2018  Medication Sig  . amLODipine (NORVASC) 10 MG tablet 1 tablet daily  . atorvastatin (LIPITOR) 20 MG tablet TAKE 1 TABLET BY MOUTH EVERY DAY  . co-enzyme Q-10 50 MG capsule Take 50 mg by mouth daily.  Marland Kitchen levothyroxine (SYNTHROID, LEVOTHROID) 75 MCG tablet TAKE 1 TABLET BY MOUTH DAILY BEFORE BREAKFAST.  Marland Kitchen triamcinolone ointment (KENALOG) 0.1 % Apply 1 application topically as needed.   Facility-Administered Encounter Medications as of 10/24/2018  Medication  . methylPREDNISolone acetate (DEPO-MEDROL) injection 40 mg    Allergies: Percocet [oxycodone-acetaminophen]  There is no height or weight on file to calculate BMI.  There were no vitals taken for this visit. Review of Systems: General:   Denies fever, chills, unexplained weight loss.  Optho/Auditory:   Denies visual changes, blurred vision/LOV Respiratory:   Denies SOB, DOE more than baseline levels.  Cardiovascular:   Denies chest pain, palpitations, new onset peripheral edema  Gastrointestinal:   Denies nausea, vomiting, diarrhea.  Genitourinary: Denies dysuria, freq/ urgency, flank pain or discharge from genitals.  Endocrine:     Denies hot or cold intolerance, polyuria, polydipsia. Musculoskeletal:   Denies unexplained myalgias, joint swelling, unexplained arthralgias, gait problems.  Skin:  Denies rash, suspicious lesions Neurological:     Denies dizziness, unexplained weakness, numbness  Psychiatric/Behavioral:   Denies mood changes, suicidal or homicidal ideations, hallucinations This patient does not have sx concerning for COVID-19 Infection (ie;  fever, chills, cough, new or worsening shortness of breath).  Observations/Objective: No distress noted during the telephone conversation.  Assessment and Plan: Continue current dosage of Amlodipine 10mg  QD Stop take-out food, keep nightly ice cream to 2 nights/week max. Follow Mediterranean diet. Slowly resume daily walking and stationery bike Continue to check BP/HR- record  Follow Up Instructions: F/u 6 weeks Telemedicine    I discussed the assessment and treatment plan with the patient. The patient was provided an opportunity to ask questions and all were answered. The patient agreed with the plan and demonstrated an understanding of the instructions.   The patient was advised to call back or seek an in-person evaluation if the symptoms worsen or if the condition fails to improve as anticipated.  I provided 12 minutes of non-face-to-face time during this encounter.   Esaw Grandchild, NP

## 2018-10-24 ENCOUNTER — Ambulatory Visit (INDEPENDENT_AMBULATORY_CARE_PROVIDER_SITE_OTHER): Payer: Medicare Other | Admitting: Adult Health

## 2018-10-24 ENCOUNTER — Other Ambulatory Visit: Payer: Self-pay

## 2018-10-24 ENCOUNTER — Encounter: Payer: Self-pay | Admitting: Adult Health

## 2018-10-24 DIAGNOSIS — I10 Essential (primary) hypertension: Secondary | ICD-10-CM | POA: Diagnosis not present

## 2018-10-24 NOTE — Assessment & Plan Note (Signed)
Ambulatory Readings- SBP 130-140s DBP 80 He denies CP/dyspnea/dizziness/palpitations Assessment and Plan: Continue current dosage of Amlodipine 10mg  QD Stop take-out food, keep nightly ice cream to 2 nights/week max. Follow Mediterranean diet. Slowly resume daily walking and stationery bike Continue to check BP/HR- record  Follow Up Instructions: F/u 6 weeks Telemedicine    I discussed the assessment and treatment plan with the patient. The patient was provided an opportunity to ask questions and all were answered. The patient agreed with the plan and demonstrated an understanding of the instructions.   The patient was advised to call back or seek an in-person evaluation if the symptoms worsen or if the condition fails to improve as anticipated.

## 2018-11-09 ENCOUNTER — Other Ambulatory Visit: Payer: Self-pay | Admitting: Adult Health

## 2018-11-21 ENCOUNTER — Telehealth: Payer: Self-pay | Admitting: Adult Health

## 2018-11-21 MED ORDER — ATORVASTATIN CALCIUM 20 MG PO TABS: 20.0000 mg | ORAL_TABLET | Freq: Every day | ORAL | 0 refills | Status: DC

## 2018-11-21 NOTE — Telephone Encounter (Signed)
Refill for atorvastatin sent to pharmacy.  Charyl Bigger, CMA

## 2018-11-21 NOTE — Telephone Encounter (Signed)
Patient called says his pharmacy notified him that his Rx refill is pending for him to call his PCP's office (pt says unsure which Rx it is?)   ---Forwarding message to medical assistant Pt has an Appt, 9/2 & to review for refill.  --glh

## 2018-12-04 NOTE — Progress Notes (Signed)
Virtual Visit via Telephone Note  I connected with Jordan Evans on 12/06/2018 at 10:00 AM EDT by telephone and verified that I am speaking with the correct person using two identifiers.  Location: Patient: Home Provider: In Clinic I discussed the limitations, risks, security and privacy concerns of performing an evaluation and management service by telephone and the availability of in person appointments. I also discussed with the patient that there may be a patient responsible charge related to this service. The patient expressed understanding and agreed to proceed.   History of Present Illness: 10/09/2018 OV: HPI: Jordan Evans presents with elevated BP He had dilated eye examination today and the Ophthalmologistdx'd with him L retinal hemorrhage. He reports chronic dry eye and "floaters", no acute change in vision. Floaters have been presents >10 years. He reports that his SBP at the Ophthalmologist and at the Urologist were both quite elevated in the 170s He is currently taking Amlodipine 5mg  QD He reports ambulatory BP readings- SBP 130s DBP 80-90 06/19/2018 OV BP 123/80, HR 54 12/13/17 OV BP 113/66, HR 58 He denies CP/chest pressure/dyspnea/palpitations TSH- March 2020: 3.130 He reports 2 HAs/month that will last "for about a day", pain localized to "top of my head". He denies CP/dyspnea/chest pressure/palpitations He reports occasional dizziness with position changes He estimates to drink 60 oz water/day Due to pandemic and having to stay with his son intermittently - he has not been biking or eating healthy- Current wt 199 Body mass index is 28.63 kg/m. 10/24/2018 OV: Jordan Evans calls in in for 2 week f/u: HTN Amlodipine was increased from 5mg  to 10mg  QD two weeks ago Ambulatory Readings- SBP 130-140s DBP 80 He denies CP/dyspnea/dizziness/palpitations He continues to abstain from tobacco/vape/ETOH use He continues to consume take-out food several times/week and eat ice cream  nightly prior to bed. 12/06/2018 OV: Jordan Evans calls in today f/u: HTN, HAs Ambulatory BP SBP 120-130s DBP 70s HR 60-70s He denies any HAs the last 4 weeks  He continues to drink plenty of water and has is slowly reducing saturated fat intake  He continues to abstain from tobacco/vape/excessive ETOH use He reports that his son will be getting married next Sept 2021 and that will be a good goal to work toward for weight loss and getting back on a regular fitness regime Patient Care Team    Relationship Specialty Notifications Start End  Knippa, Valetta Fuller D, NP PCP - General Family Medicine  08/31/16   Mcarthur Rossetti, [place] Physician Orthopedic Surgery  04/25/18     Patient Active Problem List   Diagnosis Date Noted  . BMI 26.0-26.9,adult 12/13/2017  . Irregular heartbeat 09/08/2017  . Encounter for screening fecal occult blood testing 08/25/2017  . Acute bronchitis 07/06/2017  . Screening for AAA (abdominal aortic aneurysm) 06/16/2017  . Hypothyroidism 06/16/2017  . Dizziness 03/17/2017  . Vasovagal syncope 03/17/2017  . Yeast infection 12/14/2016  . H/O multiple pulmonary nodules 12/14/2016  . Health care maintenance 08/31/2016  . Kidney calculi 08/31/2016  . Hernia of abdominal cavity 08/31/2016  . HTN (hypertension) 08/31/2016  . Hyperlipidemia 08/31/2016     Past Medical History:  Diagnosis Date  . History of kidney stones   . Hyperlipidemia   . Hypertension   . Hypothyroidism   . Syncope   . Thyroid disease      Past Surgical History:  Procedure Laterality Date  . EXTRACORPOREAL SHOCK WAVE LITHOTRIPSY Right 01/24/2017   Procedure: EXTRACORPOREAL SHOCK WAVE LITHOTRIPSY (ESWL);  Surgeon: Pilar Jarvis,  Horald Pollen, MD;  Location: [facility];  Service: Urology;  Laterality: Right;  . HERNIA REPAIR     umbilical hernia- [id number]  . LITHOTRIPSY    . ROTATOR CUFF REPAIR Right   . URETHRAL DILATION     x5   . VASECTOMY       Family History  Problem Relation Age  of Onset  . Cancer Mother        ovarian  . Hyperlipidemia Mother   . Hypertension Mother   . Heart attack Father   . Hyperlipidemia Father   . Hypertension Father   . Hyperlipidemia Sister   . Hypertension Sister   . Cancer Brother        lymph  . Hyperlipidemia Brother   . Hypertension Brother   . Diabetes Paternal Grandmother   . Hyperlipidemia Brother   . Hypertension Brother   . Hyperlipidemia Brother   . Hypertension Brother   . Hyperlipidemia Brother   . Hypertension Brother      Social History   Substance and Sexual Activity  Drug Use No     Social History   Substance and Sexual Activity  Alcohol Use Yes   Comment: rarely     Social History   Tobacco Use  Smoking Status Never Smoker  Smokeless Tobacco Never Used     Outpatient Encounter Medications as of 12/06/2018  Medication Sig  . amLODipine (NORVASC) 10 MG tablet 1 tablet daily  . atorvastatin (LIPITOR) 20 MG tablet Take 1 tablet (20 mg total) by mouth daily.  Marland Kitchen co-enzyme Q-10 50 MG capsule Take 50 mg by mouth daily.  Marland Kitchen levothyroxine (SYNTHROID, LEVOTHROID) 75 MCG tablet TAKE 1 TABLET BY MOUTH DAILY BEFORE BREAKFAST.  Marland Kitchen triamcinolone ointment (KENALOG) 0.1 % Apply 1 application topically as needed.   No facility-administered encounter medications on file as of 12/06/2018.     Allergies: Percocet [oxycodone-acetaminophen]  Body mass index is 28.41 kg/m.  Blood pressure 128/70, pulse 60, height 5\' 10"  (1.778 m), weight 198 lb (89.8 kg). Review of Systems: General:   Denies fever, chills, unexplained weight loss.  Optho/Auditory:   Denies visual changes, blurred vision/LOV Respiratory:   Denies SOB, DOE more than baseline levels.  Cardiovascular:   Denies chest pain, palpitations, new onset peripheral edema  Gastrointestinal:   Denies nausea, vomiting, diarrhea.  Genitourinary: Denies dysuria, freq/ urgency, flank pain or discharge from genitals.  Endocrine:     Denies hot or cold  intolerance, polyuria, polydipsia. Musculoskeletal:   Denies unexplained myalgias, joint swelling, unexplained arthralgias, gait problems.  Skin:  Denies rash, suspicious lesions Neurological:     Denies dizziness, unexplained weakness, numbness  Psychiatric/Behavioral:   Denies mood changes, suicidal or homicidal ideations, hallucinations This patient does not have sx concerning for COVID-19 Infection (ie; fever, chills, cough, new or worsening shortness of breath).   Observations/Objective: No acute distress noted during the telephone conversation  Assessment and Plan: BP at goal, continue at current amlodipine 10mg  QD Continue all current medications Remain well hydrated, follow heart healthy diet Slowly resume regular exercise Continue to abstain from tobacco/vape/excesive ETOH  Follow Up Instructions: 4 months CPE, fasting labs the week prior   I discussed the assessment and treatment plan with the patient. The patient was provided an opportunity to ask questions and all were answered. The patient agreed with the plan and demonstrated an understanding of the instructions.   The patient was advised to call back or seek an in-person evaluation if the symptoms worsen or  if the condition fails to improve as anticipated.  I provided 8 minutes of non-face-to-face time during this encounter.   Esaw Grandchild, NP

## 2018-12-06 ENCOUNTER — Ambulatory Visit (INDEPENDENT_AMBULATORY_CARE_PROVIDER_SITE_OTHER): Payer: Medicare Other | Admitting: Adult Health

## 2018-12-06 ENCOUNTER — Other Ambulatory Visit: Payer: Self-pay

## 2018-12-06 ENCOUNTER — Encounter: Payer: Self-pay | Admitting: Adult Health

## 2018-12-06 DIAGNOSIS — I10 Essential (primary) hypertension: Secondary | ICD-10-CM

## 2018-12-06 NOTE — Assessment & Plan Note (Signed)
Assessment and Plan: BP at goal, continue at current amlodipine 10mg  QD Continue all current medications Remain well hydrated, follow heart healthy diet Slowly resume regular exercise Continue to abstain from tobacco/vape/excesive ETOH  Follow Up Instructions: 4 months CPE, fasting labs the week prior   I discussed the assessment and treatment plan with the patient. The patient was provided an opportunity to ask questions and all were answered. The patient agreed with the plan and demonstrated an understanding of the instructions.   The patient was advised to call back or seek an in-person evaluation if the symptoms worsen or if the condition fails to improve as anticipated

## 2018-12-11 ENCOUNTER — Other Ambulatory Visit: Payer: Self-pay | Admitting: Adult Health

## 2018-12-20 ENCOUNTER — Ambulatory Visit: Payer: Medicare Other | Admitting: Adult Health

## 2019-01-03 ENCOUNTER — Other Ambulatory Visit: Payer: Self-pay | Admitting: Adult Health

## 2019-01-05 DIAGNOSIS — H35033 Hypertensive retinopathy, bilateral: Secondary | ICD-10-CM | POA: Diagnosis not present

## 2019-01-05 DIAGNOSIS — H43811 Vitreous degeneration, right eye: Secondary | ICD-10-CM | POA: Diagnosis not present

## 2019-02-15 ENCOUNTER — Other Ambulatory Visit: Payer: Self-pay | Admitting: Adult Health

## 2019-03-08 DIAGNOSIS — Z23 Encounter for immunization: Secondary | ICD-10-CM | POA: Diagnosis not present

## 2019-03-13 ENCOUNTER — Other Ambulatory Visit: Payer: Self-pay | Admitting: Adult Health

## 2019-03-13 MED ORDER — ATORVASTATIN CALCIUM 20 MG PO TABS: 20.0000 mg | ORAL_TABLET | Freq: Every day | ORAL | 0 refills | Status: DC

## 2019-04-10 ENCOUNTER — Other Ambulatory Visit: Payer: Self-pay

## 2019-04-10 DIAGNOSIS — E039 Hypothyroidism, unspecified: Secondary | ICD-10-CM

## 2019-04-10 DIAGNOSIS — Z0000 Encounter for general adult medical examination without abnormal findings: Secondary | ICD-10-CM

## 2019-04-10 DIAGNOSIS — E785 Hyperlipidemia, unspecified: Secondary | ICD-10-CM

## 2019-04-10 DIAGNOSIS — I10 Essential (primary) hypertension: Secondary | ICD-10-CM

## 2019-04-11 ENCOUNTER — Other Ambulatory Visit: Payer: Self-pay

## 2019-04-11 ENCOUNTER — Other Ambulatory Visit: Payer: Medicare Other

## 2019-04-11 DIAGNOSIS — I10 Essential (primary) hypertension: Secondary | ICD-10-CM | POA: Diagnosis not present

## 2019-04-11 DIAGNOSIS — E785 Hyperlipidemia, unspecified: Secondary | ICD-10-CM | POA: Diagnosis not present

## 2019-04-11 DIAGNOSIS — Z0000 Encounter for general adult medical examination without abnormal findings: Secondary | ICD-10-CM

## 2019-04-11 DIAGNOSIS — E039 Hypothyroidism, unspecified: Secondary | ICD-10-CM | POA: Diagnosis not present

## 2019-04-12 LAB — LIPID PANEL
Chol/HDL Ratio: 3.3 ratio (ref 0.0–5.0)
Cholesterol, Total: 193 mg/dL (ref 100–199)
HDL: 58 mg/dL (ref >39)
LDL Chol Calc (NIH): 117 mg/dL — ABNORMAL HIGH (ref 0–99)
Triglycerides: 103 mg/dL (ref 0–149)
VLDL Cholesterol Cal: 18 mg/dL (ref 5–40)

## 2019-04-12 LAB — CBC WITH DIFFERENTIAL/PLATELET
Basophils Absolute: 0.1 10*3/uL (ref 0.0–0.2)
Basos: 1 %
EOS (ABSOLUTE): 0.3 10*3/uL (ref 0.0–0.4)
Eos: 4 %
Hematocrit: 46.6 % (ref 37.5–51.0)
Hemoglobin: 15.8 g/dL (ref 13.0–17.7)
Immature Grans (Abs): 0 10*3/uL (ref 0.0–0.1)
Immature Granulocytes: 0 %
Lymphocytes Absolute: 1.9 10*3/uL (ref 0.7–3.1)
Lymphs: 28 %
MCH: 29.3 pg (ref 26.6–33.0)
MCHC: 33.9 g/dL (ref 31.5–35.7)
MCV: 86 fL (ref 79–97)
Monocytes Absolute: 0.6 10*3/uL (ref 0.1–0.9)
Monocytes: 9 %
Neutrophils Absolute: 3.8 10*3/uL (ref 1.4–7.0)
Neutrophils: 58 %
Platelets: 246 10*3/uL (ref 150–450)
RBC: 5.4 x10E6/uL (ref 4.14–5.80)
RDW: 12.6 % (ref 11.6–15.4)
WBC: 6.6 10*3/uL (ref 3.4–10.8)

## 2019-04-12 LAB — COMPREHENSIVE METABOLIC PANEL
ALT: 21 IU/L (ref 0–44)
AST: 18 IU/L (ref 0–40)
Albumin/Globulin Ratio: 1.4 (ref 1.2–2.2)
Albumin: 4 g/dL (ref 3.8–4.8)
Alkaline Phosphatase: 110 IU/L (ref 39–117)
BUN/Creatinine Ratio: 12 (ref 10–24)
BUN: 13 mg/dL (ref 8–27)
Bilirubin Total: 0.5 mg/dL (ref 0.0–1.2)
CO2: 25 mmol/L (ref 20–29)
Calcium: 8.7 mg/dL (ref 8.6–10.2)
Chloride: 103 mmol/L (ref 96–106)
Creatinine, Ser: 1.09 mg/dL (ref 0.76–1.27)
GFR calc Af Amer: 81 mL/min/{1.73_m2} (ref >59)
GFR calc non Af Amer: 70 mL/min/{1.73_m2} (ref >59)
Globulin, Total: 2.8 g/dL (ref 1.5–4.5)
Glucose: 91 mg/dL (ref 65–99)
Potassium: 4.1 mmol/L (ref 3.5–5.2)
Sodium: 141 mmol/L (ref 134–144)
Total Protein: 6.8 g/dL (ref 6.0–8.5)

## 2019-04-12 LAB — HEMOGLOBIN A1C
Est. average glucose Bld gHb Est-mCnc: 105 mg/dL
Hgb A1c MFr Bld: 5.3 % (ref 4.8–5.6)

## 2019-04-12 LAB — TSH: TSH: 4.34 u[IU]/mL (ref 0.450–4.500)

## 2019-04-13 DIAGNOSIS — R350 Frequency of micturition: Secondary | ICD-10-CM | POA: Diagnosis not present

## 2019-04-13 DIAGNOSIS — N481 Balanitis: Secondary | ICD-10-CM | POA: Diagnosis not present

## 2019-04-13 DIAGNOSIS — R3912 Poor urinary stream: Secondary | ICD-10-CM | POA: Diagnosis not present

## 2019-04-13 DIAGNOSIS — N200 Calculus of kidney: Secondary | ICD-10-CM | POA: Diagnosis not present

## 2019-04-17 NOTE — Progress Notes (Signed)
Subjective:    Patient ID: Jordan Evans, male    DOB: [birth year], 69 y.o.   MRN: [id number]  HPI:  Jordan Evans is here for CPE He reports medication compliance, denies SE. Ambulatory BP- SBP 120-130 DBP 70-80s HR 60-80, mean upper 60s He is currently on Amlodipine 10mg  QD He continues to abstain from tobacco/vape use He sparingly uses ETOH  Urologist recently added Tamsulosin 0.4mg  QD  04/11/2019 Labs: TSH-WNL, 4.340 A1c-5.3 CBC-stable CMP-stable The 10-year ASCVD risk score [name] DC Jr., et al., 2013) is: 15.5%- optimal 10.14% Values used to calculate the score:  Age: 23 years  Sex: Male  Is Non-Hispanic African American: No  Diabetic: No  Tobacco smoker: No  Systolic Blood Pressure: 0000000 mmHg  Is BP treated: Yes  HDL Cholesterol: 58 mg/dL  Total Cholesterol: 193 mg/dL  LDL-117, LDL last yr 112 Encouraged lifestyle and re-check Lipids in 3 months  Healthcare maintenance: Colonoscopy-UTD, lat 06/14/2016-repeat 2023 Immunizations-Influenza UTD, he declined Zoster, Pneumococcal, COVID-19  Patient Care Team    Relationship Specialty Notifications Start End  Mina Marble D, NP PCP - General Family Medicine  08/31/16   Mcarthur Rossetti, [place] Physician Orthopedic Surgery  04/25/18     Patient Active Problem List   Diagnosis Date Noted  . BMI 26.0-26.9,adult 12/13/2017  . Irregular heartbeat 09/08/2017  . Encounter for screening fecal occult blood testing 08/25/2017  . Acute bronchitis 07/06/2017  . Screening for AAA (abdominal aortic aneurysm) 06/16/2017  . Hypothyroidism 06/16/2017  . Dizziness 03/17/2017  . Vasovagal syncope 03/17/2017  . Yeast infection 12/14/2016  . H/O multiple pulmonary nodules 12/14/2016  . Health care maintenance 08/31/2016  . Kidney calculi 08/31/2016  . Hernia of abdominal cavity 08/31/2016  . HTN (hypertension) 08/31/2016  . Hyperlipidemia 08/31/2016     Past Medical History:  Diagnosis  Date  . History of kidney stones   . Hyperlipidemia   . Hypertension   . Hypothyroidism   . Syncope   . Thyroid disease      Past Surgical History:  Procedure Laterality Date  . EXTRACORPOREAL SHOCK WAVE LITHOTRIPSY Right 01/24/2017   Procedure: EXTRACORPOREAL SHOCK WAVE LITHOTRIPSY (ESWL);  Surgeon: Nickie Retort, MD;  Location: [facility];  Service: Urology;  Laterality: Right;  . HERNIA REPAIR     umbilical hernia- [id number]  . LITHOTRIPSY    . ROTATOR CUFF REPAIR Right   . URETHRAL DILATION     x5   . VASECTOMY       Family History  Problem Relation Age of Onset  . Cancer Mother        ovarian  . Hyperlipidemia Mother   . Hypertension Mother   . Heart attack Father   . Hyperlipidemia Father   . Hypertension Father   . Hyperlipidemia Sister   . Hypertension Sister   . Cancer Brother        lymph  . Hyperlipidemia Brother   . Hypertension Brother   . Diabetes Paternal Grandmother   . Hyperlipidemia Brother   . Hypertension Brother   . Hyperlipidemia Brother   . Hypertension Brother   . Hyperlipidemia Brother   . Hypertension Brother      Social History   Substance and Sexual Activity  Drug Use No     Social History   Substance and Sexual Activity  Alcohol Use Yes   Comment: rarely     Social History   Tobacco Use  Smoking Status Never Smoker  Smokeless Tobacco Never Used  Outpatient Encounter Medications as of 04/18/2019  Medication Sig  . amLODipine (NORVASC) 10 MG tablet TAKE 1 TABLET BY MOUTH EVERY DAY  . atorvastatin (LIPITOR) 20 MG tablet Take 1 tablet (20 mg total) by mouth daily.  Marland Kitchen co-enzyme Q-10 50 MG capsule Take 50 mg by mouth daily.  Marland Kitchen levothyroxine (SYNTHROID) 75 MCG tablet TAKE 1 TABLET BY MOUTH EVERY DAY BEFORE BREAKFAST  . tamsulosin (FLOMAX) 0.4 MG CAPS capsule Take 0.4 mg by mouth daily.  Marland Kitchen triamcinolone ointment (KENALOG) 0.1 % Apply 1 application topically as needed.   No facility-administered encounter medications  on file as of 04/18/2019.    Allergies: Percocet [oxycodone-acetaminophen]  Body mass index is 30.3 kg/m.  Blood pressure 128/76, pulse 71, temperature 98.4 F (36.9 C), temperature source Oral, height 5' 9.5" (1.765 m), weight 208 lb 3.2 oz (94.4 kg), SpO2 98 %.     Review of Systems  Constitutional: Positive for fatigue. Negative for activity change, appetite change, chills, diaphoresis, fever and unexpected weight change.  HENT: Negative for congestion.   Eyes: Negative for visual disturbance.  Respiratory: Negative for cough, chest tightness, shortness of breath, wheezing and stridor.   Cardiovascular: Negative for chest pain, palpitations and leg swelling.  Gastrointestinal: Negative for abdominal distention, anal bleeding, blood in stool, constipation, diarrhea, nausea and vomiting.  Endocrine: Negative for polydipsia, polyphagia and polyuria.  Genitourinary: Negative for difficulty urinating and flank pain.  Musculoskeletal: Negative for arthralgias and myalgias.  Neurological: Negative for dizziness.  Hematological: Negative for adenopathy. Does not bruise/bleed easily.  Psychiatric/Behavioral: Negative for agitation, behavioral problems, confusion, decreased concentration, dysphoric mood, hallucinations, self-injury, sleep disturbance and suicidal ideas. The patient is not nervous/anxious and is not hyperactive.        Objective:   Physical Exam Vitals and nursing note reviewed.  Constitutional:      General: He is not in acute distress.    Appearance: Normal appearance. He is obese. He is not ill-appearing, toxic-appearing or diaphoretic.  HENT:     Head: Normocephalic and atraumatic.     Right Ear: Tympanic membrane, ear canal and external ear normal. There is no impacted cerumen.     Left Ear: Tympanic membrane, ear canal and external ear normal. There is no impacted cerumen.     Nose: Nose normal. No congestion.     Mouth/Throat:     Mouth: Mucous membranes are  moist.  Eyes:     Extraocular Movements: Extraocular movements intact.     Conjunctiva/sclera: Conjunctivae normal.     Pupils: Pupils are equal, round, and reactive to light.  Cardiovascular:     Rate and Rhythm: Normal rate and regular rhythm.     Pulses: Normal pulses.     Heart sounds: Normal heart sounds. No murmur. No friction rub. No gallop.   Pulmonary:     Effort: Pulmonary effort is normal. No respiratory distress.     Breath sounds: Normal breath sounds. No stridor. No wheezing, rhonchi or rales.  Chest:     Chest wall: No tenderness.  Abdominal:     General: Abdomen is protuberant. Bowel sounds are normal.     Palpations: Abdomen is soft.     Tenderness: There is no abdominal tenderness. There is no right CVA tenderness or left CVA tenderness.  Musculoskeletal:        General: No tenderness. Normal range of motion.     Cervical back: Normal range of motion and neck supple.     Right lower leg: No edema.  Left lower leg: No edema.  Skin:    General: Skin is warm and dry.     Capillary Refill: Capillary refill takes less than 2 seconds.  Neurological:     Mental Status: He is alert and oriented to person, place, and time.     Coordination: Coordination normal.  Psychiatric:        Mood and Affect: Mood normal.        Behavior: Behavior normal.        Thought Content: Thought content normal.        Judgment: Judgment normal.       Assessment & Plan:   1. Health care maintenance   2. Hyperlipidemia, unspecified hyperlipidemia type   3. Hypertension, unspecified type     Health care maintenance Continue all medications as directed. To help lower cholesterol- reduce saturated fat by 50% and increase regular exercise-Increase water intake,recommend at least 30 minutes daily, 5 days per week of walking, biking, swimming, YouTube/Pinterest workout videos. Continue to social distance and wear a mask when in public. SO VERY SORRY for the loss of various family  members. Follow-up in 3 months, please come fasting so we can re-check cholesterol panel. Please give me best to your wife.  Hyperlipidemia The 10-year ASCVD risk score [name] DC Jr., et al., 2013) is: 15.5%- optimal 10.14% Values used to calculate the score:  Age: 25 years  Sex: Male  Is Non-Hispanic African American: No  Diabetic: No  Tobacco smoker: No  Systolic Blood Pressure: 0000000 mmHg  Is BP treated: Yes  HDL Cholesterol: 58 mg/dL  Total Cholesterol: 193 mg/dL  LDL-117, LDL last yr 112 Encouraged lifestyle and re-check Lipids in 3 months  HTN (hypertension) Ambulatory BP- SBP 120-130 DBP 70-80s HR 60-80, mean upper 60s He is currently on Amlodipine 10mg  QD    FOLLOW-UP:  Return in about 3 months (around 07/17/2019) for Regular Follow Up, HTN, Fasting Labs.

## 2019-04-18 ENCOUNTER — Encounter: Payer: Self-pay | Admitting: Adult Health

## 2019-04-18 ENCOUNTER — Ambulatory Visit (INDEPENDENT_AMBULATORY_CARE_PROVIDER_SITE_OTHER): Payer: Medicare Other | Admitting: Adult Health

## 2019-04-18 ENCOUNTER — Other Ambulatory Visit: Payer: Self-pay

## 2019-04-18 DIAGNOSIS — I10 Essential (primary) hypertension: Secondary | ICD-10-CM

## 2019-04-18 DIAGNOSIS — E785 Hyperlipidemia, unspecified: Secondary | ICD-10-CM

## 2019-04-18 DIAGNOSIS — Z0000 Encounter for general adult medical examination without abnormal findings: Secondary | ICD-10-CM | POA: Diagnosis not present

## 2019-04-18 NOTE — Assessment & Plan Note (Signed)
Ambulatory BP- SBP 120-130 DBP 70-80s HR 60-80, mean upper 60s He is currently on Amlodipine 10mg  QD

## 2019-04-18 NOTE — Assessment & Plan Note (Signed)
The 10-year ASCVD risk score [name] DC Brooke Bonito., et al., 2013) is: 15.5%- optimal 10.14% Values used to calculate the score:  Age: 69 years  Sex: Male  Is Non-Hispanic African American: No  Diabetic: No  Tobacco smoker: No  Systolic Blood Pressure: 0000000 mmHg  Is BP treated: Yes  HDL Cholesterol: 58 mg/dL  Total Cholesterol: 193 mg/dL  LDL-117, LDL last yr 112 Encouraged lifestyle and re-check Lipids in 3 months

## 2019-04-18 NOTE — Assessment & Plan Note (Signed)
Continue all medications as directed. To help lower cholesterol- reduce saturated fat by 50% and increase regular exercise-Increase water intake,recommend at least 30 minutes daily, 5 days per week of walking, biking, swimming, YouTube/Pinterest workout videos. Continue to social distance and wear a mask when in public. SO VERY SORRY for the loss of various family members. Follow-up in 3 months, please come fasting so we can re-check cholesterol panel. Please give me best to your wife.

## 2019-04-18 NOTE — Patient Instructions (Addendum)

## 2019-04-25 DIAGNOSIS — R3912 Poor urinary stream: Secondary | ICD-10-CM | POA: Diagnosis not present

## 2019-04-25 DIAGNOSIS — N210 Calculus in bladder: Secondary | ICD-10-CM | POA: Diagnosis not present

## 2019-04-25 DIAGNOSIS — R3915 Urgency of urination: Secondary | ICD-10-CM | POA: Diagnosis not present

## 2019-04-26 ENCOUNTER — Other Ambulatory Visit: Payer: Self-pay | Admitting: Urology

## 2019-04-30 ENCOUNTER — Other Ambulatory Visit: Payer: Self-pay

## 2019-04-30 ENCOUNTER — Other Ambulatory Visit (HOSPITAL_COMMUNITY)
Admission: RE | Admit: 2019-04-30 | Discharge: 2019-04-30 | Disposition: A | Discharge status: Home / Self Care | Payer: Medicare Other | Source: Ambulatory Visit | Attending: Urology | Admitting: Urology

## 2019-04-30 ENCOUNTER — Encounter (HOSPITAL_BASED_OUTPATIENT_CLINIC_OR_DEPARTMENT_OTHER): Payer: Self-pay | Admitting: Urology

## 2019-04-30 DIAGNOSIS — Z20822 Contact with and (suspected) exposure to covid-19: Secondary | ICD-10-CM | POA: Diagnosis not present

## 2019-04-30 DIAGNOSIS — Z01812 Encounter for preprocedural laboratory examination: Secondary | ICD-10-CM | POA: Insufficient documentation

## 2019-04-30 LAB — SARS CORONAVIRUS 2 (TAT 6-24 HRS): SARS Coronavirus 2: NEGATIVE

## 2019-04-30 NOTE — Progress Notes (Signed)
Spoke with:  Shanon Brow NPO:  After Midnight, no gum, candy, or mints   Arrival time:  1045AM Labs: Istat8 AM medications: Levothyroxine Pre op orders: Yes Ride home: Anne Ng (wife) [phone]

## 2019-05-02 NOTE — H&P (Signed)
04/25/19: Jordan Evans returns today in f/u. He has a history of stones and balanitis with prior issues of strictures. He had a recent episode of dark urine but can't say it is blood. He has issues with urgency and some penile pain. he has had no flank pain. He was given tamsulosin but had hypotension and had to stop it. He just gave a sample and feels he could go again.    04/13/19: Today he presents with complaints of bothersome frequency, urgency, and weak urinary stream. He states that symptoms have become more progressive over the past month. At times he can have frequency of every 20 minutes. He is having nocturia x3. He denies any dysuria, but will have sharp pains at his urethral meatus at times. He does note weak stream, but states that if he stays well hydrated his stream is generally fair. He does have spraying of his stream at times. He denies any hematuria. He generally feels he is able to empty well.     ALLERGIES: Percocet TABS tamsulosin - Dizziness, Low BP, weak, fatigue    MEDICATIONS: Levothyroxine Sodium 50 mcg tablet  Tamsulosin Hcl 0.4 mg capsule 1 capsule PO Daily  Amlodipine Besylate 5 mg tablet  Lipitor 10 MG Oral Tablet Oral  Multivitamin     GU PSH: Complex Uroflow - 03/20/2018 ESWL - 01/24/2017, 2011 Rpr Umbil Hern; Reduc < 5 Yr - 2011 Vasectomy - 2011       [place] Notes: Dilation Of Male Urethral Stricture, Umbilical Hernia Repair, Lithotripsy, Surgery Of Male Genitalia Vasectomy   NON-GU PSH: Shoulder Surgery (Unspecified), Left     GU PMH: Balanitis - 04/13/2019, - 09/20/2018, 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 I am have given him a script for clobetasol cream. , - 01/06/2017 Renal calculus - 04/13/2019, The previously noted renal stone is now a 9 x7 mm right mid ureteral stone. It is 1200 HU and visible on the scout. There is no obstruction. I discussed observation, ESWL and URS and he would like to give it some more time. I will have him  return in 2 weeks with a KUB and if the stone hasn't moved, he will need ESWL. I reviewed the risks in detail including bleeding, infection, injury adjacent structures, need for secondary procedures, cardiac arrhythmia, thrombotic events and sedation complications. 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 I discussed diet and hydration and will have him return in 6 months with a KUB. - 09/19/2017 Meatal stenosis (other urethral stricture) (Stable), He has stable voiding symptoms but some urethral itching and pain at times. I will have him do a flowrate at f/u in 4months. - 09/19/2017 Ureteral calculus, See above - 01/06/2017 Low back pain, Lumbago - 2014 Personal Hx Oth Urinary System diseases, History of urethral stricture - 2014 Renal cyst, Renal cyst, acquired - 2014 Testicular pain, unspecified, Testicular pain - 2014      PMH Notes:  1898-04-05 00:00:00 - Note: Normal Routine History And Physical Adult   NON-GU PMH: Personal history of other diseases of the circulatory system, History of hypertension - 2014 Personal history of other diseases of the digestive system, History of esophageal reflux - 2014 Personal history of other endocrine, nutritional and metabolic disease, History of hypercholesterolemia - 2014 GERD Hypercholesterolemia Hypertension Hypothyroidism    FAMILY  HISTORY: Cancer - Brother Congenital heart failure - Father   SOCIAL HISTORY: Marital Status: Married Preferred Language: English; Race: White Current Smoking Status: Patient has never smoked.   Tobacco Use Assessment Completed: Used Tobacco in last 30 days? Drinks 3 drinks per week. Types of alcohol consumed: Beer.  Drinks 1 caffeinated drink per day.     Notes: Alcohol Use, Occupation:, Marital History -  Currently Married, Tobacco Use, Caffeine Use   REVIEW OF SYSTEMS:    GU Review Male:   Patient denies frequent urination, hard to postpone urination, burning/ pain with urination, get up at night to urinate, leakage of urine, stream starts and stops, trouble starting your stream, have to strain to urinate , erection problems, and penile pain.  Gastrointestinal (Upper):   Patient denies nausea, vomiting, and indigestion/ heartburn.  Gastrointestinal (Lower):   Patient denies diarrhea and constipation.  Constitutional:   Patient denies fever, night sweats, weight loss, and fatigue.  Skin:   Patient denies skin rash/ lesion and itching.  Eyes:   Patient denies blurred vision and double vision.  Ears/ Nose/ Throat:   Patient denies sore throat and sinus problems.  Hematologic/Lymphatic:   Patient denies swollen glands and easy bruising.  Cardiovascular:   Patient denies leg swelling and chest pains.  Respiratory:   Patient denies cough and shortness of breath.  Endocrine:   Patient denies excessive thirst.  Musculoskeletal:   Patient denies back pain and joint pain.  Neurological:   Patient denies headaches and dizziness.  Psychologic:   Patient denies depression and anxiety.   VITAL SIGNS:      04/25/2019 03:26 PM  Weight 202 lb / 91.63 kg  Height 70 in / 177.8 cm  BP 170/78 mmHg  Pulse 83 /min  Temperature 98.2 F / 36.7 C  BMI 29.0 kg/m   PAST DATA REVIEWED:  Source Of History:  Patient  Urine Test Review:   Urinalysis  Urodynamics Review:   Review Bladder Scan, Review Flow Rate  X-Ray Review: KUB: Reviewed Films. Discussed With Patient.  Bladder Ultrasound: Reviewed Films. Discussed With Patient.     PROCEDURES:         Bladder Ultrasound [id number]  There is a 1.9 cm post bladder calc seen  Bladder:  Bladder volume is 90.18 m There is a 1.9 cm post bladder calcification seen.      KUB finding confirmed.   Patient confirmed No Neulasta OnPro Device.            KUB - [id number]   A single view of the abdomen is obtained. The KUB shows stable phleboliths. There is a faint ovoid density in the mid pelvis that is about 1.2 cm and could be a bladder stone. No other significant bone, gas or soft tissue abnormalities are noted.       Patient confirmed No Neulasta OnPro Device.          Flow Rate - [id number]  Voided Volume: 200 cc  Total Void Time: 0:24 min:sec  Average Flow Rate: 8 cc/sec  Flow Time: 0:24 min:sec  Peak Flow Rate: 10 cc/sec  Time of Peak Flow: 0:06 min:sec           PVR Ultrasound - [id number]  Scanned Volume: 85 cc         Urinalysis Dipstick Dipstick Cont'd  Color: Yellow Bilirubin: Neg mg/dL  Appearance: Clear Ketones: Trace mg/dL  Specific Gravity: 1.025 Blood: Neg ery/uL  pH: <=5.0 Protein: Neg mg/dL  Glucose: Neg mg/dL  Urobilinogen: 0.2 mg/dL    Nitrites: Neg    Leukocyte Esterase: Neg leu/uL    ASSESSMENT:      ICD-10 Details  1 GU:   Urinary Urgency - R39.15 Chronic, Worsening - He didn't tolerate tamsulosin.  2   Bladder Stone - N21.0 Undiagnosed New Problem - He has a 1.9cm bladder stone that is the probable culprit for his urinary urgency. I will get him set up for a cystolithalopaxy. I have reviewed the risks of bleeding, infection, urethral and bladder injury, strictures, need for secondary procedures, thrombotic events and anesthetic complications.   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 He will probably need dilation at the time of the bladder stone removal.  4   Weak Urinary Stream - R39.12 Chronic, Stable - He has a moderately reduced stream but is emptying ok.      PLAN:           Orders X-Rays: Bladder Ultrasound - Possible 1.2cm bladder stone on KUB.     KUB          Schedule Return Visit/Planned Activity: ASAP - Schedule Surgery  Procedure: Unspecified Date - Cysto Dilate Stricture (M or F) - 52281 Notes: Next available.  Procedure: Unspecified Date -  Cysto Bladder Stone <2.5cm - [id number] Notes: Next available.          Document

## 2019-05-03 ENCOUNTER — Ambulatory Visit (HOSPITAL_BASED_OUTPATIENT_CLINIC_OR_DEPARTMENT_OTHER): Payer: Medicare Other | Admitting: Certified Registered Nurse Anesthetist

## 2019-05-03 ENCOUNTER — Encounter (HOSPITAL_BASED_OUTPATIENT_CLINIC_OR_DEPARTMENT_OTHER)
Admission: RE | Disposition: A | Discharge status: Home / Self Care | Payer: Self-pay | Source: Home / Self Care | Attending: Urology

## 2019-05-03 ENCOUNTER — Ambulatory Visit (HOSPITAL_BASED_OUTPATIENT_CLINIC_OR_DEPARTMENT_OTHER)
Admission: RE | Admit: 2019-05-03 | Discharge: 2019-05-03 | Disposition: A | Discharge status: Home / Self Care | Payer: Medicare Other | Attending: Urology | Admitting: Urology

## 2019-05-03 ENCOUNTER — Encounter (HOSPITAL_BASED_OUTPATIENT_CLINIC_OR_DEPARTMENT_OTHER): Payer: Self-pay | Admitting: Urology

## 2019-05-03 ENCOUNTER — Other Ambulatory Visit: Payer: Self-pay

## 2019-05-03 DIAGNOSIS — Z79890 Hormone replacement therapy (postmenopausal): Secondary | ICD-10-CM | POA: Diagnosis not present

## 2019-05-03 DIAGNOSIS — I10 Essential (primary) hypertension: Secondary | ICD-10-CM | POA: Insufficient documentation

## 2019-05-03 DIAGNOSIS — N210 Calculus in bladder: Secondary | ICD-10-CM | POA: Insufficient documentation

## 2019-05-03 DIAGNOSIS — Z8249 Family history of ischemic heart disease and other diseases of the circulatory system: Secondary | ICD-10-CM | POA: Diagnosis not present

## 2019-05-03 DIAGNOSIS — Z79899 Other long term (current) drug therapy: Secondary | ICD-10-CM | POA: Insufficient documentation

## 2019-05-03 DIAGNOSIS — R3912 Poor urinary stream: Secondary | ICD-10-CM | POA: Insufficient documentation

## 2019-05-03 DIAGNOSIS — N35912 Unspecified bulbous urethral stricture, male: Secondary | ICD-10-CM | POA: Diagnosis not present

## 2019-05-03 DIAGNOSIS — E039 Hypothyroidism, unspecified: Secondary | ICD-10-CM | POA: Insufficient documentation

## 2019-05-03 DIAGNOSIS — N35812 Other urethral bulbous stricture, male: Secondary | ICD-10-CM | POA: Diagnosis not present

## 2019-05-03 HISTORY — DX: Presence of spectacles and contact lenses: Z97.3

## 2019-05-03 HISTORY — PX: CYSTOSCOPY WITH URETHRAL DILATATION: SHX5125

## 2019-05-03 HISTORY — PX: CYSTOSCOPY WITH LITHOLAPAXY: SHX1425

## 2019-05-03 LAB — POCT I-STAT, CHEM 8
BUN: 16 mg/dL (ref 8–23)
Calcium, Ion: 1.16 mmol/L (ref 1.15–1.40)
Chloride: 105 mmol/L (ref 98–111)
Creatinine, Ser: 0.9 mg/dL (ref 0.61–1.24)
Glucose, Bld: 94 mg/dL (ref 70–99)
HCT: 54 % — ABNORMAL HIGH (ref 39.0–52.0)
Hemoglobin: 18.4 g/dL — ABNORMAL HIGH (ref 13.0–17.0)
Potassium: 3.9 mmol/L (ref 3.5–5.1)
Sodium: 145 mmol/L (ref 135–145)
TCO2: 24 mmol/L (ref 22–32)

## 2019-05-03 SURGERY — CYSTOSCOPY, WITH BLADDER CALCULUS LITHOLAPAXY
Anesthesia: General | Site: Bladder

## 2019-05-03 MED ORDER — MIDAZOLAM HCL 2 MG/2ML IJ SOLN
INTRAMUSCULAR | Status: AC
  Filled 2019-05-03: qty 2

## 2019-05-03 MED ORDER — DEXAMETHASONE SODIUM PHOSPHATE 10 MG/ML IJ SOLN
INTRAMUSCULAR | Status: DC | PRN
  Administered 2019-05-03: 10 mg via INTRAVENOUS

## 2019-05-03 MED ORDER — PROPOFOL 10 MG/ML IV BOLUS
INTRAVENOUS | Status: DC | PRN
  Administered 2019-05-03: 150 mg via INTRAVENOUS

## 2019-05-03 MED ORDER — SODIUM CHLORIDE 0.9 % IR SOLN
Status: DC | PRN
  Administered 2019-05-03: 6000 mL via INTRAVESICAL

## 2019-05-03 MED ORDER — CEFAZOLIN SODIUM-DEXTROSE 2-4 GM/100ML-% IV SOLN
2.0000 g | INTRAVENOUS | Status: AC
  Administered 2019-05-03: 13:00:00 2 g via INTRAVENOUS
  Filled 2019-05-03: qty 100

## 2019-05-03 MED ORDER — LACTATED RINGERS IV SOLN: INTRAVENOUS | Status: DC | PRN

## 2019-05-03 MED ORDER — MIDAZOLAM HCL 5 MG/5ML IJ SOLN
INTRAMUSCULAR | Status: DC | PRN
  Administered 2019-05-03: 2 mg via INTRAVENOUS

## 2019-05-03 MED ORDER — FENTANYL CITRATE (PF) 100 MCG/2ML IJ SOLN
INTRAMUSCULAR | Status: AC
  Filled 2019-05-03: qty 2

## 2019-05-03 MED ORDER — SODIUM CHLORIDE 0.9 % IV SOLN
INTRAVENOUS | Status: DC | PRN
  Administered 2019-05-03: 13 mL

## 2019-05-03 MED ORDER — ONDANSETRON HCL 4 MG/2ML IJ SOLN
INTRAMUSCULAR | Status: DC | PRN
  Administered 2019-05-03: 4 mg via INTRAVENOUS

## 2019-05-03 MED ORDER — CEFAZOLIN SODIUM-DEXTROSE 2-4 GM/100ML-% IV SOLN
INTRAVENOUS | Status: AC
  Filled 2019-05-03: qty 100

## 2019-05-03 MED ORDER — FENTANYL CITRATE (PF) 100 MCG/2ML IJ SOLN
INTRAMUSCULAR | Status: DC | PRN
  Administered 2019-05-03 (×2): 25 ug via INTRAVENOUS

## 2019-05-03 MED ORDER — LIDOCAINE 2% (20 MG/ML) 5 ML SYRINGE
INTRAMUSCULAR | Status: DC | PRN
  Administered 2019-05-03: 80 mg via INTRAVENOUS

## 2019-05-03 MED ORDER — ONDANSETRON HCL 4 MG/2ML IJ SOLN
4.0000 mg | Freq: Once | INTRAMUSCULAR | Status: DC | PRN
  Filled 2019-05-03: qty 2

## 2019-05-03 MED ORDER — SODIUM CHLORIDE 0.9% FLUSH
3.0000 mL | Freq: Two times a day (BID) | INTRAVENOUS | Status: DC
  Filled 2019-05-03: qty 3

## 2019-05-03 MED ORDER — LACTATED RINGERS IV SOLN
INTRAVENOUS | Status: DC
  Administered 2019-05-03: 12:00:00 1000 mL via INTRAVENOUS
  Filled 2019-05-03: qty 1000

## 2019-05-03 MED ORDER — FENTANYL CITRATE (PF) 100 MCG/2ML IJ SOLN
25.0000 ug | INTRAMUSCULAR | Status: DC | PRN
  Filled 2019-05-03: qty 1

## 2019-05-03 MED ORDER — PROPOFOL 10 MG/ML IV BOLUS
INTRAVENOUS | Status: AC
  Filled 2019-05-03: qty 20

## 2019-05-03 SURGICAL SUPPLY — 28 items
BAG DRAIN URO-CYSTO SKYTR STRL (DRAIN) ×3 IMPLANT
BALLN NEPHROSTOMY (BALLOONS) ×3
BALLOON NEPHROSTOMY (BALLOONS) ×1 IMPLANT
CATH FOLEY 2WAY SLVR  5CC 16FR (CATHETERS)
CATH FOLEY 2WAY SLVR 5CC 16FR (CATHETERS) IMPLANT
CLOTH BEACON ORANGE TIMEOUT ST (SAFETY) ×6 IMPLANT
ELECT REM PT RETURN 9FT ADLT (ELECTROSURGICAL) ×3
ELECTRODE REM PT RTRN 9FT ADLT (ELECTROSURGICAL) ×1 IMPLANT
FIBER LASER FLEXIVA 1000 (UROLOGICAL SUPPLIES) ×3 IMPLANT
FIBER LASER FLEXIVA 365 (UROLOGICAL SUPPLIES) IMPLANT
FIBER LASER FLEXIVA 550 (UROLOGICAL SUPPLIES) IMPLANT
FIBER LASER TRAC TIP (UROLOGICAL SUPPLIES) IMPLANT
GLOVE SURG SS PI 8.0 STRL IVOR (GLOVE) ×3 IMPLANT
GOWN STRL REUS W/TWL XL LVL3 (GOWN DISPOSABLE) ×3 IMPLANT
GUIDEWIRE STR DUAL SENSOR (WIRE) IMPLANT
KIT TURNOVER CYSTO (KITS) ×3 IMPLANT
MANIFOLD NEPTUNE II (INSTRUMENTS) IMPLANT
NDL SAFETY ECLIPSE 18X1.5 (NEEDLE) IMPLANT
NEEDLE HYPO 18GX1.5 SHARP (NEEDLE)
NEEDLE HYPO 22GX1.5 SAFETY (NEEDLE) IMPLANT
NS IRRIG 500ML POUR BTL (IV SOLUTION) IMPLANT
PACK CYSTO (CUSTOM PROCEDURE TRAY) ×3 IMPLANT
SYR 20ML LL LF (SYRINGE) IMPLANT
SYR 30ML LL (SYRINGE) IMPLANT
TUBE CONNECTING 12'X1/4 (SUCTIONS)
TUBE CONNECTING 12X1/4 (SUCTIONS) IMPLANT
WATER STERILE IRR 3000ML UROMA (IV SOLUTION) ×3 IMPLANT
WATER STERILE IRR 500ML POUR (IV SOLUTION) IMPLANT

## 2019-05-03 NOTE — Op Note (Signed)
Procedure: 1.  Cystoscopy with dilation of bulbar urethral stricture. 2.  Cystoscopy with right retrograde pyelogram of the duplicated system and interpretation. 3.  Cystolitholapaxy of 1.9 cm bladder stone. 4.  Application of fluoroscopy.  Preop diagnosis: 1.  History of bulbar urethral stricture with reduced stream. 2.  1.9 cm bladder stone. 3.  Right abdominal pain with history of stones and a duplicated right collecting system.  Postop diagnosis: 1.  Moderate bulbar urethral stricture. 2.  1.9 cm bladder stone. 3.  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  Surgeon: Jordan Evans.  Anesthesia: General.  Specimens: Stone fragments.  Drains: None.  EBL: None.  Complications: None.  Indications: Jordan Evans is a 69 year old male with a history of a bulbar urethral stricture is had a reduced stream.  He has had increased urinary urgency and was found at 1.9 cm bladder stone on ultrasound and KUB.  This morning he had some right abdominal discomfort and was concerned about a right kidney stone since he has had those before.  Procedure: He was given 2 g of Ancef.  A general anesthetic was induced.  He was fitted with PAS hose after placement in the lithotomy position.  His perineum and genitalia were prepped with Betadine solution he was draped in usual sterile fashion.  Cystoscopy was performed using a 23 [place] scope and 30 and 70 degree lenses.  Examination revealed a normal anterior urethra.  There was a moderate stricture in the bulbar urethra which I was able to disrupt and adequately dilate with the scope.  The external sphincter was intact.  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  Examination of bladder revealed moderate trabeculation with some cellules and small diverticuli.  The mucosa was unremarkable and no tumors were seen.  There was a tan irregular 1.9 cm bladder stone at the base of the bladder.  The left ureteral  orifice was unremarkable and effluxed clear urine.  There were 2 right ureteral orifice ease adjacent one another on the medial trigonal ridge.  There was clear efflux from the more superior orifice.  I did not see efflux  from the more inferior orifice.  Right retrograde pyelography was performed on both the upper and lower pole collecting systems with a 5 [place] open-ended catheter and Omnipaque.  Right retrograde pyelogram demonstrated upper and lower pole completely duplicated systems without filling defect or hydronephrosis.  No evidence of stones were seen.  In the system decompressed rapidly.  The cystoscope was replaced with the greenlight laser bridge which was fitted with a 30 degree lens and a 1000 m holmium laser fiber initially set on 1 J and 20 Hz.  The periphery of the stone which had a branching coral like texture easily fragmented.  The center of the stone was harder and required an increase in power to 1.8 J.  The stone was eventually broken into manageable fragments which were then aspirated from the bladder leaving only very fine sand adhered to the mucosa.  Final inspection revealed no active bleeding, no mucosal lesions and only very minimal residual stone material.  The bladder was partially drained and the cystoscope was removed.  He was taken down from lithotomy position, his anesthetic was reversed and he was moved to recovery in stable condition.  There were no complications.  The stone fragments will be given to the family to bring to the office for analysis.

## 2019-05-03 NOTE — Anesthesia Postprocedure Evaluation (Signed)
Anesthesia Post Note  Patient: Jordan Evans  Procedure(s) Performed: CYSTOSCOPY WITH LITHOLAPAXY WITH RIGHT PYELOGRAM (N/A Bladder) CYSTOSCOPY WITH URETHRAL DILATATION (N/A Bladder)     Patient location during evaluation: PACU Anesthesia Type: General Level of consciousness: awake and alert Pain management: pain level controlled Vital Signs Assessment: post-procedure vital signs reviewed and stable Respiratory status: spontaneous breathing, nonlabored ventilation, respiratory function stable and patient connected to nasal cannula oxygen Cardiovascular status: blood pressure returned to baseline and stable Postop Assessment: no apparent nausea or vomiting Anesthetic complications: no    Last Vitals:  Vitals:   05/03/19 1345 05/03/19 1400  BP: 128/86 140/86  Pulse: 62 63  Resp: 16 12  Temp:    SpO2: 98% 99%    Last Pain:  Vitals:   05/03/19 1400  TempSrc:   PainSc: 0-No pain                 Tiajuana Amass

## 2019-05-03 NOTE — Discharge Instructions (Addendum)
CYSTOSCOPY HOME CARE INSTRUCTIONS  Activity: Rest for the remainder of the day.  Do not drive or operate equipment today.  You may resume normal activities in one to two days as instructed by your physician.   Meals: Drink plenty of liquids and eat light foods such as gelatin or soup this evening.  You may return to a normal meal plan tomorrow.  Return to Work: You may return to work in one to two days or as instructed by your physician.  Special Instructions / Symptoms: Call your physician if any of these symptoms occur:   -persistent or heavy bleeding  -bleeding which continues after first few urination  -large blood clots that are difficult to pass  -urine stream diminishes or stops completely  -fever equal to or higher than 101 degrees Farenheit.  -cloudy urine with a strong, foul odor  -severe pain  Females should always wipe from front to back after elimination.  You may feel some burning pain when you urinate.  This should disappear with time.  Applying moist heat to the lower abdomen or a hot tub bath may help relieve the pain. \   Post Anesthesia Home Care Instructions  Activity: Get plenty of rest for the remainder of the day. A responsible adult should stay with you for 24 hours following the procedure.  For the next 24 hours, DO NOT: -Drive a car -Paediatric nurse -Drink alcoholic beverages -Take any medication unless instructed by your physician -Make any legal decisions or sign important papers.  Meals: Start with liquid foods such as gelatin or soup. Progress to regular foods as tolerated. Avoid greasy, spicy, heavy foods. If nausea and/or vomiting occur, drink only clear liquids until the nausea and/or vomiting subsides. Call your physician if vomiting continues.  Special Instructions/Symptoms: Your throat may feel dry or sore from the anesthesia or the breathing tube placed in your throat during surgery. If this causes discomfort, gargle with warm salt water.  The discomfort should disappear within 24 hours.  If you had a scopolamine patch placed behind your ear for the management of post- operative nausea and/or vomiting:  1. The medication in the patch is effective for 72 hours, after which it should be removed.  Wrap patch in a tissue and discard in the trash. Wash hands thoroughly with soap and water. 2. You may remove the patch earlier than 72 hours if you experience unpleasant side effects which may include dry mouth, dizziness or visual disturbances. 3. Avoid touching the patch. Wash your hands with soap and water after contact with the patch.     Patient Signature:  ________________________________________________________  Nurse's Signature:  ________________________________________________________

## 2019-05-03 NOTE — Interval H&P Note (Signed)
History and Physical Interval Note:  he reports some right abdominal pressure.  I will add a right retrograde and possible ureteroscopy with stent to his permit and reviewed the risks.   05/03/2019 12:27 PM  Jordan Evans  has presented today for surgery, with the diagnosis of [place].  The various methods of treatment have been discussed with the patient and family. After consideration of risks, benefits and other options for treatment, the patient has consented to  Procedure(s): CYSTOSCOPY WITH LITHOLAPAXY (N/A) CYSTOSCOPY WITH URETHRAL DILATATION (N/A) as a surgical intervention.  The patient's history has been reviewed, patient examined, no change in status, stable for surgery.  I have reviewed the patient's chart and labs.  Questions were answered to the patient's satisfaction.     [name]

## 2019-05-03 NOTE — Anesthesia Procedure Notes (Signed)
Procedure Name: LMA Insertion Date/Time: 05/03/2019 12:40 PM Performed by: Mitzie Na, CRNA Pre-anesthesia Checklist: Patient identified, Emergency Drugs available, Suction available and Patient being monitored Patient Re-evaluated:Patient Re-evaluated prior to induction Oxygen Delivery Method: Circle system utilized Preoxygenation: Pre-oxygenation with 100% oxygen Induction Type: IV induction LMA: LMA inserted LMA Size: 4.0 Tube type: Oral Number of attempts: 1 Placement Confirmation: positive ETCO2 and breath sounds checked- equal and bilateral Tube secured with: Tape Dental Injury: Teeth and Oropharynx as per pre-operative assessment

## 2019-05-03 NOTE — Anesthesia Preprocedure Evaluation (Signed)
Anesthesia Evaluation  Patient identified by MRN, date of birth, ID band Patient awake    Reviewed: Allergy & Precautions, NPO status , Patient's Chart, lab work & pertinent test results  Airway Mallampati: II  TM Distance: >3 FB Neck ROM: Full    Dental  (+) Dental Advisory Given   Pulmonary neg pulmonary ROS,    breath sounds clear to auscultation       Cardiovascular hypertension, Pt. on medications  Rhythm:Regular Rate:Normal     Neuro/Psych negative neurological ROS     GI/Hepatic negative GI ROS, Neg liver ROS,   Endo/Other  Hypothyroidism   Renal/GU Renal disease     Musculoskeletal   Abdominal   Peds  Hematology negative hematology ROS (+)   Anesthesia Other Findings   Reproductive/Obstetrics                             Anesthesia Physical Anesthesia Plan  ASA: II  Anesthesia Plan: General   Post-op Pain Management:    Induction: Intravenous  PONV Risk Score and Plan: 2 and Dexamethasone, Ondansetron and Treatment may vary due to age or medical condition  Airway Management Planned: LMA  Additional Equipment:   Intra-op Plan:   Post-operative Plan: Extubation in OR  Informed Consent: I have reviewed the patients History and Physical, chart, labs and discussed the procedure including the risks, benefits and alternatives for the proposed anesthesia with the patient or authorized representative who has indicated his/her understanding and acceptance.     Dental advisory given  Plan Discussed with: CRNA  Anesthesia Plan Comments:         Anesthesia Quick Evaluation

## 2019-05-03 NOTE — Interval H&P Note (Deleted)
History and Physical Interval Note:  05/03/2019 12:22 PM  Jordan Evans  has presented today for surgery, with the diagnosis of [place].  The various methods of treatment have been discussed with the patient and family. After consideration of risks, benefits and other options for treatment, the patient has consented to  Procedure(s): CYSTOSCOPY WITH LITHOLAPAXY (N/A) CYSTOSCOPY WITH URETHRAL DILATATION (N/A) as a surgical intervention.  The patient's history has been reviewed, patient examined, no change in status, stable for surgery.  I have reviewed the patient's chart and labs.  Questions were answered to the patient's satisfaction.     [name]

## 2019-05-03 NOTE — Transfer of Care (Signed)
Immediate Anesthesia Transfer of Care Note  Patient: Jordan Evans  Procedure(s) Performed: Procedure(s) (LRB): CYSTOSCOPY WITH LITHOLAPAXY WITH RIGHT PYELOGRAM (N/A) CYSTOSCOPY WITH URETHRAL DILATATION (N/A)  Patient Location: PACU  Anesthesia Type: General  Level of Consciousness: awake, oriented, sedated and patient cooperative  Airway & Oxygen Therapy: Patient Spontanous Breathing and Patient connected to face mask oxygen  Post-op Assessment: Report given to PACU RN and Post -op Vital signs reviewed and stable  Post vital signs: Reviewed and stable  Complications: No apparent anesthesia complications Last Vitals:  Vitals Value Taken Time  BP 133/91 05/03/19 1321  Temp    Pulse 72 05/03/19 1324  Resp 10 05/03/19 1324  SpO2 96 % 05/03/19 1324  Vitals shown include unvalidated device data.  Last Pain:  Vitals:   05/03/19 1102  TempSrc: Oral  PainSc: 2       Patients Stated Pain Goal: 5 (05/03/19 1102)

## 2019-05-14 DIAGNOSIS — N35011 Post-traumatic bulbous urethral stricture: Secondary | ICD-10-CM | POA: Diagnosis not present

## 2019-05-14 DIAGNOSIS — N210 Calculus in bladder: Secondary | ICD-10-CM | POA: Diagnosis not present

## 2019-05-14 DIAGNOSIS — Z87442 Personal history of urinary calculi: Secondary | ICD-10-CM | POA: Diagnosis not present

## 2019-06-21 ENCOUNTER — Encounter: Payer: Self-pay | Admitting: Adult Health

## 2019-06-21 ENCOUNTER — Other Ambulatory Visit: Payer: Self-pay | Admitting: Family Medicine

## 2019-06-21 MED ORDER — LEVOTHYROXINE SODIUM 75 MCG PO TABS: ORAL_TABLET | ORAL | 0 refills | Status: DC

## 2019-06-23 ENCOUNTER — Other Ambulatory Visit: Payer: Self-pay | Admitting: Adult Health

## 2019-06-28 ENCOUNTER — Other Ambulatory Visit: Payer: Self-pay | Admitting: Adult Health

## 2019-07-03 ENCOUNTER — Telehealth: Payer: Self-pay | Admitting: Adult Health

## 2019-07-03 MED ORDER — ATORVASTATIN CALCIUM 20 MG PO TABS: 20.0000 mg | ORAL_TABLET | Freq: Every day | ORAL | 0 refills | Status: DC

## 2019-07-03 MED ORDER — AMLODIPINE BESYLATE 10 MG PO TABS: ORAL_TABLET | ORAL | 0 refills | Status: DC

## 2019-07-03 NOTE — Telephone Encounter (Signed)
Patient is requesting a refill of his amlodipine and atorvastatin. If approved please send [facility] on [address].

## 2019-07-03 NOTE — Addendum Note (Signed)
Addended by: Fonnie Mu on: 07/03/2019 11:59 AM   Modules accepted: Orders

## 2019-07-09 ENCOUNTER — Encounter: Payer: Self-pay | Admitting: Adult Health

## 2019-07-11 ENCOUNTER — Other Ambulatory Visit: Payer: Self-pay | Admitting: Adult Health

## 2019-07-12 ENCOUNTER — Other Ambulatory Visit: Payer: Medicare Other

## 2019-07-17 ENCOUNTER — Ambulatory Visit: Payer: Medicare Other | Admitting: Physician Assistant

## 2019-08-15 DIAGNOSIS — N481 Balanitis: Secondary | ICD-10-CM | POA: Diagnosis not present

## 2019-08-15 DIAGNOSIS — N4341 Spermatocele of epididymis, single: Secondary | ICD-10-CM | POA: Diagnosis not present

## 2019-08-15 DIAGNOSIS — Z87442 Personal history of urinary calculi: Secondary | ICD-10-CM | POA: Diagnosis not present

## 2019-08-15 DIAGNOSIS — R3912 Poor urinary stream: Secondary | ICD-10-CM | POA: Diagnosis not present

## 2019-08-16 ENCOUNTER — Encounter: Payer: Self-pay | Admitting: Podiatry

## 2019-08-16 ENCOUNTER — Ambulatory Visit (INDEPENDENT_AMBULATORY_CARE_PROVIDER_SITE_OTHER): Payer: Medicare Other

## 2019-08-16 ENCOUNTER — Ambulatory Visit (INDEPENDENT_AMBULATORY_CARE_PROVIDER_SITE_OTHER): Payer: Medicare Other | Admitting: Podiatry

## 2019-08-16 ENCOUNTER — Other Ambulatory Visit: Payer: Self-pay | Admitting: Podiatry

## 2019-08-16 ENCOUNTER — Other Ambulatory Visit: Payer: Self-pay

## 2019-08-16 VITALS — BP 156/87 | HR 62 | Temp 97.8°F | Resp 16

## 2019-08-16 DIAGNOSIS — M722 Plantar fascial fibromatosis: Secondary | ICD-10-CM

## 2019-08-16 DIAGNOSIS — M7661 Achilles tendinitis, right leg: Secondary | ICD-10-CM | POA: Diagnosis not present

## 2019-08-16 DIAGNOSIS — M79671 Pain in right foot: Secondary | ICD-10-CM | POA: Diagnosis not present

## 2019-08-16 NOTE — Progress Notes (Signed)
Subjective:   Patient ID: Jordan Evans, male   DOB: 69 y.o.   MRN: [id number]   HPI Patient presents stating he seems to get more pain in the back of his right heel and has had history of pain in the bottom of his heel.  States that it is achy and that it hurts worse when sleeping and sitting not as much when standing and its been going on for around 6 months and patient does not smoke likes to be active   ROS      Objective:  Physical Exam Vitals and nursing note reviewed.  Constitutional:      Appearance: He is well-developed.  Pulmonary:     Effort: Pulmonary effort is normal.  Musculoskeletal:        General: Normal range of motion.  Skin:    General: Skin is warm.  Neurological:     Mental Status: He is alert.     Neurovascular status intact muscle strength adequate range of motion within normal limits.  Patient is found to have posterior inflammation of the Achilles insertion medial side localized with no center or lateral involvement muscle strength adequate Achilles with no equinus condition noted.  Mild pain plantar fascial band right     Assessment:  Probability for low grade with acute nature Achilles tendinitis right with inflammation on the medial side and mild plantar fasciitis     Plan:  H&P x-rays reviewed discussed and at this point discussed injection explaining risk of rupture associated with it but I do think we can keep it away from the center lateral side and it offers a chance of solving his problem.  Patient wants procedure and at this point I did a sterile prep injected the medial side 3 mg dexamethasone 5 mg Xylocaine advised on reduced activity stretching exercises ice heel lift and if symptoms were to persist let us know and we may also need to treat the plantar fashion depending on response  X-rays indicate no signs of spur no signs of injury secondary to the pain he is experiencing

## 2019-08-16 NOTE — Patient Instructions (Signed)
Plantar Fasciitis (Heel Spur Syndrome) with Rehab The plantar fascia is a fibrous, ligament-like, soft-tissue structure that spans the bottom of the foot. Plantar fasciitis is a condition that causes pain in the foot due to inflammation of the tissue. SYMPTOMS   Pain and tenderness on the underneath side of the foot.  Pain that worsens with standing or walking. CAUSES  Plantar fasciitis is caused by irritation and injury to the plantar fascia on the underneath side of the foot. Common mechanisms of injury include:  Direct trauma to bottom of the foot.  Damage to a small nerve that runs under the foot where the main fascia attaches to the heel bone.  Stress placed on the plantar fascia due to bone spurs. RISK INCREASES WITH:   Activities that place stress on the plantar fascia (running, jumping, pivoting, or cutting).  Poor strength and flexibility.  Improperly fitted shoes.  Tight calf muscles.  Flat feet.  Failure to warm-up properly before activity.  Obesity. PREVENTION  Warm up and stretch properly before activity.  Allow for adequate recovery between workouts.  Maintain physical fitness:  Strength, flexibility, and endurance.  Cardiovascular fitness.  Maintain a health body weight.  Avoid stress on the plantar fascia.  Wear properly fitted shoes, including arch supports for individuals who have flat feet.  PROGNOSIS  If treated properly, then the symptoms of plantar fasciitis usually resolve without surgery. However, occasionally surgery is necessary.  RELATED COMPLICATIONS   Recurrent symptoms that may result in a chronic condition.  Problems of the lower back that are caused by compensating for the injury, such as limping.  Pain or weakness of the foot during push-off following surgery.  Chronic inflammation, scarring, and partial or complete fascia tear, occurring more often from repeated injections.  TREATMENT  Treatment initially involves the  use of ice and medication to help reduce pain and inflammation. The use of strengthening and stretching exercises may help reduce pain with activity, especially stretches of the Achilles tendon. These exercises may be performed at home or with a therapist. Your caregiver may recommend that you use heel cups of arch supports to help reduce stress on the plantar fascia. Occasionally, corticosteroid injections are given to reduce inflammation. If symptoms persist for greater than 6 months despite non-surgical (conservative), then surgery may be recommended.   MEDICATION   If pain medication is necessary, then nonsteroidal anti-inflammatory medications, such as aspirin and ibuprofen, or other minor pain relievers, such as acetaminophen, are often recommended.  Do not take pain medication within 7 days before surgery.  Prescription pain relievers may be given if deemed necessary by your caregiver. Use only as directed and only as much as you need.  Corticosteroid injections may be given by your caregiver. These injections should be reserved for the most serious cases, because they may only be given a certain number of times.  HEAT AND COLD  Cold treatment (icing) relieves pain and reduces inflammation. Cold treatment should be applied for 10 to 15 minutes every 2 to 3 hours for inflammation and pain and immediately after any activity that aggravates your symptoms. Use ice packs or massage the area with a piece of ice (ice massage).  Heat treatment may be used prior to performing the stretching and strengthening activities prescribed by your caregiver, physical therapist, or athletic trainer. Use a heat pack or soak the injury in warm water.  SEEK IMMEDIATE MEDICAL CARE IF:  Treatment seems to offer no benefit, or the condition worsens.  Any medications   produce adverse side effects.  EXERCISES- RANGE OF MOTION (ROM) AND STRETCHING EXERCISES - Plantar Fasciitis (Heel Spur Syndrome) These exercises  may help you when beginning to rehabilitate your injury. Your symptoms may resolve with or without further involvement from your physician, physical therapist or athletic trainer. While completing these exercises, remember:   Restoring tissue flexibility helps normal motion to return to the joints. This allows healthier, less painful movement and activity.  An effective stretch should be held for at least 30 seconds.  A stretch should never be painful. You should only feel a gentle lengthening or release in the stretched tissue.  RANGE OF MOTION - Toe Extension, Flexion  Sit with your right / left leg crossed over your opposite knee.  Grasp your toes and gently pull them back toward the top of your foot. You should feel a stretch on the bottom of your toes and/or foot.  Hold this stretch for 10 seconds.  Now, gently pull your toes toward the bottom of your foot. You should feel a stretch on the top of your toes and or foot.  Hold this stretch for 10 seconds. Repeat  times. Complete this stretch 3 times per day.   RANGE OF MOTION - Ankle Dorsiflexion, Active Assisted  Remove shoes and sit on a chair that is preferably not on a carpeted surface.  Place right / left foot under knee. Extend your opposite leg for support.  Keeping your heel down, slide your right / left foot back toward the chair until you feel a stretch at your ankle or calf. If you do not feel a stretch, slide your bottom forward to the edge of the chair, while still keeping your heel down.  Hold this stretch for 10 seconds. Repeat 3 times. Complete this stretch 2 times per day.   STRETCH  Gastroc, Standing  Place hands on wall.  Extend right / left leg, keeping the front knee somewhat bent.  Slightly point your toes inward on your back foot.  Keeping your right / left heel on the floor and your knee straight, shift your weight toward the wall, not allowing your back to arch.  You should feel a gentle stretch  in the right / left calf. Hold this position for 10 seconds. Repeat 3 times. Complete this stretch 2 times per day.  STRETCH  Soleus, Standing  Place hands on wall.  Extend right / left leg, keeping the other knee somewhat bent.  Slightly point your toes inward on your back foot.  Keep your right / left heel on the floor, bend your back knee, and slightly shift your weight over the back leg so that you feel a gentle stretch deep in your back calf.  Hold this position for 10 seconds. Repeat 3 times. Complete this stretch 2 times per day.  STRETCH  Gastrocsoleus, Standing  Note: This exercise can place a lot of stress on your foot and ankle. Please complete this exercise only if specifically instructed by your caregiver.   Place the ball of your right / left foot on a step, keeping your other foot firmly on the same step.  Hold on to the wall or a rail for balance.  Slowly lift your other foot, allowing your body weight to press your heel down over the edge of the step.  You should feel a stretch in your right / left calf.  Hold this position for 10 seconds.  Repeat this exercise with a slight bend in your right /   left knee. Repeat 3 times. Complete this stretch 2 times per day.   STRENGTHENING EXERCISES - Plantar Fasciitis (Heel Spur Syndrome)  These exercises may help you when beginning to rehabilitate your injury. They may resolve your symptoms with or without further involvement from your physician, physical therapist or athletic trainer. While completing these exercises, remember:   Muscles can gain both the endurance and the strength needed for everyday activities through controlled exercises.  Complete these exercises as instructed by your physician, physical therapist or athletic trainer. Progress the resistance and repetitions only as guided.  STRENGTH - Towel Curls  Sit in a chair positioned on a non-carpeted surface.  Place your foot on a towel, keeping your heel  on the floor.  Pull the towel toward your heel by only curling your toes. Keep your heel on the floor. Repeat 3 times. Complete this exercise 2 times per day.  STRENGTH - Ankle Inversion  Secure one end of a rubber exercise band/tubing to a fixed object (table, pole). Loop the other end around your foot just before your toes.  Place your fists between your knees. This will focus your strengthening at your ankle.  Slowly, pull your big toe up and in, making sure the band/tubing is positioned to resist the entire motion.  Hold this position for 10 seconds.  Have your muscles resist the band/tubing as it slowly pulls your foot back to the starting position. Repeat 3 times. Complete this exercises 2 times per day.  Document Released: 03/22/2005 Document Revised: 06/14/2011 Document Reviewed: 07/04/2008 [employer] Patient Information 2014 [employer]. Achilles Tendinitis  with Rehab Achilles tendinitis is a disorder of the Achilles tendon. The Achilles tendon connects the large calf muscles (Gastrocnemius and Soleus) to the heel bone (calcaneus). This tendon is sometimes called the heel cord. It is important for pushing-off and standing on your toes and is important for walking, running, or jumping. Tendinitis is often caused by overuse and repetitive microtrauma. SYMPTOMS  Pain, tenderness, swelling, warmth, and redness may occur over the Achilles tendon even at rest.  Pain with pushing off, or flexing or extending the ankle.  Pain that is worsened after or during activity. CAUSES   Overuse sometimes seen with rapid increase in exercise programs or in sports requiring running and jumping.  Poor physical conditioning (strength and flexibility or endurance).  Running sports, especially training running down hills.  Inadequate warm-up before practice or play or failure to stretch before participation.  Injury to the tendon. PREVENTION   Warm up and stretch before practice or  competition.  Allow time for adequate rest and recovery between practices and competition.  Keep up conditioning.  Keep up ankle and leg flexibility.  Improve or keep muscle strength and endurance.  Improve cardiovascular fitness.  Use proper technique.  Use proper equipment (shoes, skates).  To help prevent recurrence, taping, protective strapping, or an adhesive bandage may be recommended for several weeks after healing is complete. PROGNOSIS   Recovery may take weeks to several months to heal.  Longer recovery is expected if symptoms have been prolonged.  Recovery is usually quicker if the inflammation is due to a direct blow as compared with overuse or sudden strain. RELATED COMPLICATIONS   Healing time will be prolonged if the condition is not correctly treated. The injury must be given plenty of time to heal.  Symptoms can reoccur if activity is resumed too soon.  Untreated, tendinitis may increase the risk of tendon rupture requiring additional time for recovery   and possibly surgery. TREATMENT   The first treatment consists of rest anti-inflammatory medication, and ice to relieve the pain.  Stretching and strengthening exercises after resolution of pain will likely help reduce the risk of recurrence. Referral to a physical therapist or athletic trainer for further evaluation and treatment may be helpful.  A walking boot or cast may be recommended to rest the Achilles tendon. This can help break the cycle of inflammation and microtrauma.  Arch supports (orthotics) may be prescribed or recommended by your caregiver as an adjunct to therapy and rest.  Surgery to remove the inflamed tendon lining or degenerated tendon tissue is rarely necessary and has shown less than predictable results. MEDICATION   Nonsteroidal anti-inflammatory medications, such as aspirin and ibuprofen, may be used for pain and inflammation relief. Do not take within 7 days before surgery. Take  these as directed by your caregiver. Contact your caregiver immediately if any bleeding, stomach upset, or signs of allergic reaction occur. Other minor pain relievers, such as acetaminophen, may also be used.  Pain relievers may be prescribed as necessary by your caregiver. Do not take prescription pain medication for longer than 4 to 7 days. Use only as directed and only as much as you need.  Cortisone injections are rarely indicated. Cortisone injections may weaken tendons and predispose to rupture. It is better to give the condition more time to heal than to use them. HEAT AND COLD  Cold is used to relieve pain and reduce inflammation for acute and chronic Achilles tendinitis. Cold should be applied for 10 to 15 minutes every 2 to 3 hours for inflammation and pain and immediately after any activity that aggravates your symptoms. Use ice packs or an ice massage.  Heat may be used before performing stretching and strengthening activities prescribed by your caregiver. Use a heat pack or a warm soak. SEEK MEDICAL CARE IF:  Symptoms get worse or do not improve in 2 weeks despite treatment.  New, unexplained symptoms develop. Drugs used in treatment may produce side effects.  EXERCISES:  RANGE OF MOTION (ROM) AND STRETCHING EXERCISES - Achilles Tendinitis  These exercises may help you when beginning to rehabilitate your injury. Your symptoms may resolve with or without further involvement from your physician, physical therapist or athletic trainer. While completing these exercises, remember:   Restoring tissue flexibility helps normal motion to return to the joints. This allows healthier, less painful movement and activity.  An effective stretch should be held for at least 30 seconds.  A stretch should never be painful. You should only feel a gentle lengthening or release in the stretched tissue.  STRETCH  Gastroc, Standing   Place hands on wall.  Extend right / left leg, keeping the  front knee somewhat bent.  Slightly point your toes inward on your back foot.  Keeping your right / left heel on the floor and your knee straight, shift your weight toward the wall, not allowing your back to arch.  You should feel a gentle stretch in the right / left calf. Hold this position for 10 seconds. Repeat 3 times. Complete this stretch 2 times per day.  STRETCH  Soleus, Standing   Place hands on wall.  Extend right / left leg, keeping the other knee somewhat bent.  Slightly point your toes inward on your back foot.  Keep your right / left heel on the floor, bend your back knee, and slightly shift your weight over the back leg so that you feel a   gentle stretch deep in your back calf.  Hold this position for 10 seconds. Repeat 3 times. Complete this stretch 2 times per day.  STRETCH  Gastrocsoleus, Standing  Note: This exercise can place a lot of stress on your foot and ankle. Please complete this exercise only if specifically instructed by your caregiver.   Place the ball of your right / left foot on a step, keeping your other foot firmly on the same step.  Hold on to the wall or a rail for balance.  Slowly lift your other foot, allowing your body weight to press your heel down over the edge of the step.  You should feel a stretch in your right / left calf.  Hold this position for 10 seconds.  Repeat this exercise with a slight bend in your knee. Repeat 3 times. Complete this stretch 2 times per day.   STRENGTHENING EXERCISES - Achilles Tendinitis These exercises may help you when beginning to rehabilitate your injury. They may resolve your symptoms with or without further involvement from your physician, physical therapist or athletic trainer. While completing these exercises, remember:   Muscles can gain both the endurance and the strength needed for everyday activities through controlled exercises.  Complete these exercises as instructed by your physician,  physical therapist or athletic trainer. Progress the resistance and repetitions only as guided.  You may experience muscle soreness or fatigue, but the pain or discomfort you are trying to eliminate should never worsen during these exercises. If this pain does worsen, stop and make certain you are following the directions exactly. If the pain is still present after adjustments, discontinue the exercise until you can discuss the trouble with your clinician.  STRENGTH - Plantar-flexors   Sit with your right / left leg extended. Holding onto both ends of a rubber exercise band/tubing, loop it around the ball of your foot. Keep a slight tension in the band.  Slowly push your toes away from you, pointing them downward.  Hold this position for 10 seconds. Return slowly, controlling the tension in the band/tubing. Repeat 3 times. Complete this exercise 2 times per day.   STRENGTH - Plantar-flexors   Stand with your feet shoulder width apart. Steady yourself with a wall or table using as little support as needed.  Keeping your weight evenly spread over the width of your feet, rise up on your toes.*  Hold this position for 10 seconds. Repeat 3 times. Complete this exercise 2 times per day.  *If this is too easy, shift your weight toward your right / left leg until you feel challenged. Ultimately, you may be asked to do this exercise with your right / left foot only.  STRENGTH  Plantar-flexors, Eccentric  Note: This exercise can place a lot of stress on your foot and ankle. Please complete this exercise only if specifically instructed by your caregiver.   Place the balls of your feet on a step. With your hands, use only enough support from a wall or rail to keep your balance.  Keep your knees straight and rise up on your toes.  Slowly shift your weight entirely to your right / left toes and pick up your opposite foot. Gently and with controlled movement, lower your weight through your right /  left foot so that your heel drops below the level of the step. You will feel a slight stretch in the back of your calf at the end position.  Use the healthy leg to help rise up onto   the balls of both feet, then lower weight only on the right / left leg again. Build up to 15 repetitions. Then progress to 3 consecutive sets of 15 repetitions.*  After completing the above exercise, complete the same exercise with a slight knee bend (about 30 degrees). Again, build up to 15 repetitions. Then progress to 3 consecutive sets of 15 repetitions.* Perform this exercise 2 times per day.  *When you easily complete 3 sets of 15, your physician, physical therapist or athletic trainer may advise you to add resistance by wearing a backpack filled with additional weight.  STRENGTH - Plantar Flexors, Seated   Sit on a chair that allows your feet to rest flat on the ground. If necessary, sit at the edge of the chair.  Keeping your toes firmly on the ground, lift your right / left heel as far as you can without increasing any discomfort in your ankle. Repeat 3 times. Complete this exercise 2 times a day.  

## 2019-08-16 NOTE — Progress Notes (Signed)
   Subjective:    Patient ID: Jordan Evans, male    DOB: [birth year], 69 y.o.   MRN: [id number]  HPI    Review of Systems  All other systems reviewed and are negative.      Objective:   Physical Exam        Assessment & Plan:

## 2019-09-13 ENCOUNTER — Other Ambulatory Visit: Payer: Self-pay | Admitting: Family Medicine

## 2019-09-13 ENCOUNTER — Telehealth: Payer: Self-pay | Admitting: Physician Assistant

## 2019-09-13 ENCOUNTER — Encounter: Payer: Self-pay | Admitting: Podiatry

## 2019-09-13 MED ORDER — AMLODIPINE BESYLATE 10 MG PO TABS: ORAL_TABLET | ORAL | 0 refills | Status: DC

## 2019-09-13 MED ORDER — LEVOTHYROXINE SODIUM 75 MCG PO TABS: ORAL_TABLET | ORAL | 0 refills | Status: DC

## 2019-09-13 MED ORDER — ATORVASTATIN CALCIUM 20 MG PO TABS: 20.0000 mg | ORAL_TABLET | Freq: Every day | ORAL | 0 refills | Status: DC

## 2019-09-13 NOTE — Telephone Encounter (Signed)
Can you please call patient to schedule apt for further medication refills. Thank you AS, CMA

## 2019-10-02 ENCOUNTER — Other Ambulatory Visit: Payer: Self-pay | Admitting: Family Medicine

## 2019-10-04 ENCOUNTER — Other Ambulatory Visit: Payer: Self-pay

## 2019-10-05 ENCOUNTER — Other Ambulatory Visit: Payer: Self-pay | Admitting: Physician Assistant

## 2019-10-09 MED ORDER — AMLODIPINE BESYLATE 10 MG PO TABS: ORAL_TABLET | ORAL | 0 refills | Status: DC

## 2019-11-06 ENCOUNTER — Other Ambulatory Visit: Payer: Self-pay | Admitting: Physician Assistant

## 2019-11-06 ENCOUNTER — Ambulatory Visit (INDEPENDENT_AMBULATORY_CARE_PROVIDER_SITE_OTHER): Payer: Medicare Other | Admitting: Physician Assistant

## 2019-11-06 ENCOUNTER — Other Ambulatory Visit: Payer: Self-pay

## 2019-11-06 ENCOUNTER — Encounter: Payer: Self-pay | Admitting: Physician Assistant

## 2019-11-06 VITALS — BP 137/81 | HR 74 | Temp 97.4°F | Ht 70.0 in | Wt 212.7 lb

## 2019-11-06 DIAGNOSIS — E039 Hypothyroidism, unspecified: Secondary | ICD-10-CM

## 2019-11-06 DIAGNOSIS — I10 Essential (primary) hypertension: Secondary | ICD-10-CM | POA: Diagnosis not present

## 2019-11-06 DIAGNOSIS — E785 Hyperlipidemia, unspecified: Secondary | ICD-10-CM | POA: Diagnosis not present

## 2019-11-06 MED ORDER — LEVOTHYROXINE SODIUM 75 MCG PO TABS: ORAL_TABLET | ORAL | 1 refills | Status: DC

## 2019-11-06 MED ORDER — AMLODIPINE BESYLATE 10 MG PO TABS: ORAL_TABLET | ORAL | 1 refills | Status: DC

## 2019-11-06 MED ORDER — ATORVASTATIN CALCIUM 20 MG PO TABS: 20.0000 mg | ORAL_TABLET | Freq: Every day | ORAL | 1 refills | Status: DC

## 2019-11-06 NOTE — Assessment & Plan Note (Signed)
-  Stable, BP recheck 137/81 -Continue current medication regimen. -Continue low-sodium diet.

## 2019-11-06 NOTE — Progress Notes (Signed)
Established Patient Office Visit  Subjective:  Patient ID: Jordan Evans, male    DOB: [birth year]  Age: 69 y.o. MRN: [id number]  CC:  Chief Complaint  Patient presents with  . Hypertension  . Hyperlipidemia    HPI Jordan Evans presents for follow-up on hypertension and hyperlipidemia.  HTN: Pt denies chest pain, palpitations, dizziness or lower extremity swelling. Taking medication as directed without side effects. Checks BP at home and readings range in 130s/80s. Pt follows a low salt diet.  HLD: Pt reports taking medication every other day due to concerns of off long-term side effects. Reports he has not really made any dietary changes and is not as active as he used to be before the pandemic.  Hypothyroid: Reports has been on same dose for a very long time. States he stays cold all the time.  Denies fatigue.   Past Medical History:  Diagnosis Date  . History of kidney stones   . Hyperlipidemia   . Hypertension   . Hypothyroidism   . Syncope   . Wears glasses     Past Surgical History:  Procedure Laterality Date  . COLONOSCOPY     x2  . CYSTOSCOPY WITH LITHOLAPAXY N/A 05/03/2019   Procedure: CYSTOSCOPY WITH LITHOLAPAXY WITH RIGHT PYELOGRAM;  Surgeon: [name], MD;  Location: [facility];  Service: Urology;  Laterality: N/A;  . CYSTOSCOPY WITH URETHRAL DILATATION N/A 05/03/2019   Procedure: CYSTOSCOPY WITH URETHRAL DILATATION;  Surgeon: [name], MD;  Location: [facility];  Service: Urology;  Laterality: N/A;  . EXTRACORPOREAL SHOCK WAVE LITHOTRIPSY Right 01/24/2017   Procedure: EXTRACORPOREAL SHOCK WAVE LITHOTRIPSY (ESWL);  Surgeon: Nickie Retort, MD;  Location: [facility];  Service: Urology;  Laterality: Right;  . HERNIA REPAIR     umbilical hernia- [id number]  . LITHOTRIPSY    . ROTATOR CUFF REPAIR Right   . ROTATOR CUFF REPAIR Left   . UPPER GI ENDOSCOPY    . URETHRAL DILATION     x5   . VASECTOMY      Family History   Problem Relation Age of Onset  . Cancer Mother        ovarian  . Hyperlipidemia Mother   . Hypertension Mother   . Heart attack Father   . Hyperlipidemia Father   . Hypertension Father   . Hyperlipidemia Sister   . Hypertension Sister   . Cancer Brother        lymph  . Hyperlipidemia Brother   . Hypertension Brother   . Diabetes Paternal Grandmother   . Hyperlipidemia Brother   . Hypertension Brother   . Hyperlipidemia Brother   . Hypertension Brother   . Hyperlipidemia Brother   . Hypertension Brother     Social History   Socioeconomic History  . Marital status: Married    Spouse name: Not on file  . Number of children: Not on file  . Years of education: Not on file  . Highest education level: Not on file  Occupational History  . Not on file  Tobacco Use  . Smoking status: Never Smoker  . Smokeless tobacco: Never Used  Vaping Use  . Vaping Use: Never used  Substance and Sexual Activity  . Alcohol use: Yes    Comment: 1 beer weekly  . Drug use: Not Currently  . Sexual activity: Yes    Birth control/protection: Surgical  Other Topics Concern  . Not on file  Social History Narrative   Lives with  wife   [employer], works for [employer] resources   Children 5   Caffeine- 1 soda daily   Social Determinants of Radio broadcast assistant Strain:   . Difficulty of Paying Living Expenses:   Food Insecurity:   . Worried About Charity fundraiser in the Last Year:   . Arboriculturist in the Last Year:   Transportation Needs:   . Film/video editor (Medical):   Marland Kitchen Lack of Transportation (Non-Medical):   Physical Activity:   . Days of Exercise per Week:   . Minutes of Exercise per Session:   Stress:   . Feeling of Stress :   Social Connections:   . Frequency of Communication with Friends and Family:   . Frequency of Social Gatherings with Friends and Family:   . Attends Religious Services:   . Active Member of Clubs or Organizations:   . Attends English as a second language teacher Meetings:   Marland Kitchen Marital Status:   Intimate Partner Violence:   . Fear of Current or Ex-Partner:   . Emotionally Abused:   Marland Kitchen Physically Abused:   . Sexually Abused:     Outpatient Medications Prior to Visit  Medication Sig Dispense Refill  . cholecalciferol (VITAMIN D3) 25 MCG (1000 UNIT) tablet Take 1,000 Units by mouth daily.    . Magnesium 250 MG TABS Take 250 mg by mouth daily.    Marland Kitchen triamcinolone ointment (KENALOG) 0.1 % Apply 1 application topically as needed.  1  . amLODipine (NORVASC) 10 MG tablet TAKE 1 TABLET BY MOUTH EVERY DAY**PATIENT NEEDS APT FOR FURTHER REFILLS** 30 tablet 0  . atorvastatin (LIPITOR) 20 MG tablet Take 1 tablet (20 mg total) by mouth daily. **PATIENT NEEDS APT FOR FURTHER REFILLS** 60 tablet 0  . levothyroxine (SYNTHROID) 75 MCG tablet TAKE 1 TABLET BY MOUTH EVERY DAY BEFORE BREAKFAST**PATIENT NEEDS APT FOR FURTHER REFILLS** 60 tablet 0   No facility-administered medications prior to visit.    Allergies  Allergen Reactions  . Percocet [Oxycodone-Acetaminophen]     Leg weakness  . Tamsulosin     Dropped blood pressure, near syncope    ROS Review of Systems  A fourteen system review of systems was performed and found to be positive as per HPI.   Objective:    Physical Exam General:  Well Developed, well nourished, appropriate for stated age.  Neuro:  Alert and oriented,  extra-ocular muscles intact, no focal deficits  HEENT:  Normocephalic, atraumatic, neck supple Skin:  no gross rash, warm, pink. Cardiac:  RRR, S1 S2 Respiratory:  ECTA B/L and A/P, Not using accessory muscles, speaking in full sentences- unlabored. Vascular:  Ext warm, no cyanosis apprec.; cap RF less 2 sec. No gross edema Psych:  No HI/SI, judgement and insight good, Euthymic mood. Full Affect.   BP (!) 159/76   Pulse 74   Temp (!) 97.4 F (36.3 C) (Oral)   Ht 5' 10" (1.778 m)   Wt 212 lb 11.2 oz (96.5 kg)   SpO2 96%   BMI 30.52 kg/m  Wt Readings from  Last 3 Encounters:  11/06/19 212 lb 11.2 oz (96.5 kg)  05/03/19 212 lb 3.2 oz (96.3 kg)  04/18/19 208 lb 3.2 oz (94.4 kg)     Health Maintenance Due  Topic Date Due  . INFLUENZA VACCINE  11/04/2019    There are no preventive care reminders to display for this patient.  Lab Results  Component Value Date   TSH 4.340 04/11/2019  Lab Results  Component Value Date   WBC 6.6 04/11/2019   HGB 18.4 (H) 05/03/2019   HCT 54.0 (H) 05/03/2019   MCV 86 04/11/2019   PLT 246 04/11/2019   Lab Results  Component Value Date   NA 145 05/03/2019   K 3.9 05/03/2019   CO2 25 04/11/2019   GLUCOSE 94 05/03/2019   BUN 16 05/03/2019   CREATININE 0.90 05/03/2019   BILITOT 0.5 04/11/2019   ALKPHOS 110 04/11/2019   AST 18 04/11/2019   ALT 21 04/11/2019   PROT 6.8 04/11/2019   ALBUMIN 4.0 04/11/2019   CALCIUM 8.7 04/11/2019   ANIONGAP 7 08/27/2017   Lab Results  Component Value Date   CHOL 193 04/11/2019   Lab Results  Component Value Date   HDL 58 04/11/2019   Lab Results  Component Value Date   LDLCALC 117 (H) 04/11/2019   Lab Results  Component Value Date   TRIG 103 04/11/2019   Lab Results  Component Value Date   CHOLHDL 3.3 04/11/2019   Lab Results  Component Value Date   HGBA1C 5.3 04/11/2019      Assessment & Plan:   Problem List Items Addressed This Visit      Cardiovascular and Mediastinum   HTN (hypertension) - Primary   Relevant Medications   amLODipine (NORVASC) 10 MG tablet   atorvastatin (LIPITOR) 20 MG tablet   Other Relevant Orders   Comp Met (CMET)     Endocrine   Hypothyroidism   Relevant Medications   levothyroxine (SYNTHROID) 75 MCG tablet     Other   Hyperlipidemia   Relevant Medications   amLODipine (NORVASC) 10 MG tablet   atorvastatin (LIPITOR) 20 MG tablet   Other Relevant Orders   Lipid Profile   Comp Met (CMET)      Meds ordered this encounter  Medications  . amLODipine (NORVASC) 10 MG tablet    Sig: TAKE 1 TABLET BY  MOUTH EVERY DAY    Dispense:  90 tablet    Refill:  1    Order Specific Question:   Supervising Provider    Answer:   Beatrice Lecher D [[id number]]  . atorvastatin (LIPITOR) 20 MG tablet    Sig: Take 1 tablet (20 mg total) by mouth daily.    Dispense:  90 tablet    Refill:  1    Order Specific Question:   Supervising Provider    Answer:   Beatrice Lecher D [[id number]]  . levothyroxine (SYNTHROID) 75 MCG tablet    Sig: TAKE 1 TABLET BY MOUTH EVERY DAY BEFORE BREAKFAST    Dispense:  90 tablet    Refill:  1    Order Specific Question:   Supervising Provider    Answer:   Beatrice Lecher D [[id number]]    Follow-up: Return in about 4 months (around 03/07/2020) for HTN, HLD, Hypothyroid.    Lorrene Reid, PA-C

## 2019-11-06 NOTE — Assessment & Plan Note (Signed)
-  Last TSH within normal limits -Continue levothyroxine 75 mcg.  Provided refills

## 2019-11-06 NOTE — Patient Instructions (Signed)

## 2019-11-06 NOTE — Assessment & Plan Note (Signed)
-  Last lipid panel: LDL elevated -Rechecking lipid panel and hepatic function today. -Due to patient concerns okay to continue Lipitor 20 mg every other day. -Recommend to reduce red meat and fried foods and increase physical activity.

## 2019-11-07 LAB — COMPREHENSIVE METABOLIC PANEL
ALT: 24 IU/L (ref 0–44)
AST: 24 IU/L (ref 0–40)
Albumin/Globulin Ratio: 1.8 (ref 1.2–2.2)
Albumin: 4.7 g/dL (ref 3.8–4.8)
Alkaline Phosphatase: 126 IU/L — ABNORMAL HIGH (ref 48–121)
BUN/Creatinine Ratio: 12 (ref 10–24)
BUN: 13 mg/dL (ref 8–27)
Bilirubin Total: 0.6 mg/dL (ref 0.0–1.2)
CO2: 26 mmol/L (ref 20–29)
Calcium: 8.9 mg/dL (ref 8.6–10.2)
Chloride: 102 mmol/L (ref 96–106)
Creatinine, Ser: 1.07 mg/dL (ref 0.76–1.27)
GFR calc Af Amer: 83 mL/min/{1.73_m2} (ref >59)
GFR calc non Af Amer: 71 mL/min/{1.73_m2} (ref >59)
Globulin, Total: 2.6 g/dL (ref 1.5–4.5)
Glucose: 96 mg/dL (ref 65–99)
Potassium: 4.7 mmol/L (ref 3.5–5.2)
Sodium: 141 mmol/L (ref 134–144)
Total Protein: 7.3 g/dL (ref 6.0–8.5)

## 2019-11-07 LAB — LIPID PANEL
Chol/HDL Ratio: 3.9 ratio (ref 0.0–5.0)
Cholesterol, Total: 231 mg/dL — ABNORMAL HIGH (ref 100–199)
HDL: 59 mg/dL (ref >39)
LDL Chol Calc (NIH): 148 mg/dL — ABNORMAL HIGH (ref 0–99)
Triglycerides: 136 mg/dL (ref 0–149)
VLDL Cholesterol Cal: 24 mg/dL (ref 5–40)

## 2019-11-29 DIAGNOSIS — N35011 Post-traumatic bulbous urethral stricture: Secondary | ICD-10-CM | POA: Diagnosis not present

## 2019-12-26 DIAGNOSIS — N4340 Spermatocele of epididymis, unspecified: Secondary | ICD-10-CM | POA: Diagnosis not present

## 2019-12-26 DIAGNOSIS — N4341 Spermatocele of epididymis, single: Secondary | ICD-10-CM | POA: Diagnosis not present

## 2019-12-26 DIAGNOSIS — L720 Epidermal cyst: Secondary | ICD-10-CM | POA: Diagnosis not present

## 2019-12-26 DIAGNOSIS — N471 Phimosis: Secondary | ICD-10-CM | POA: Diagnosis not present

## 2019-12-26 DIAGNOSIS — N35812 Other urethral bulbous stricture, male: Secondary | ICD-10-CM | POA: Diagnosis not present

## 2019-12-26 DIAGNOSIS — N480 Leukoplakia of penis: Secondary | ICD-10-CM | POA: Diagnosis not present

## 2019-12-31 DIAGNOSIS — R8279 Other abnormal findings on microbiological examination of urine: Secondary | ICD-10-CM | POA: Diagnosis not present

## 2019-12-31 DIAGNOSIS — N480 Leukoplakia of penis: Secondary | ICD-10-CM | POA: Diagnosis not present

## 2019-12-31 DIAGNOSIS — N35011 Post-traumatic bulbous urethral stricture: Secondary | ICD-10-CM | POA: Diagnosis not present

## 2019-12-31 DIAGNOSIS — N4341 Spermatocele of epididymis, single: Secondary | ICD-10-CM | POA: Diagnosis not present

## 2020-02-12 DIAGNOSIS — N35011 Post-traumatic bulbous urethral stricture: Secondary | ICD-10-CM | POA: Diagnosis not present

## 2020-02-12 DIAGNOSIS — N481 Balanitis: Secondary | ICD-10-CM | POA: Diagnosis not present

## 2020-03-03 DIAGNOSIS — L900 Lichen sclerosus et atrophicus: Secondary | ICD-10-CM | POA: Diagnosis not present

## 2020-03-03 DIAGNOSIS — D225 Melanocytic nevi of trunk: Secondary | ICD-10-CM | POA: Diagnosis not present

## 2020-03-03 DIAGNOSIS — L821 Other seborrheic keratosis: Secondary | ICD-10-CM | POA: Diagnosis not present

## 2020-03-03 DIAGNOSIS — L570 Actinic keratosis: Secondary | ICD-10-CM | POA: Diagnosis not present

## 2020-03-03 DIAGNOSIS — L814 Other melanin hyperpigmentation: Secondary | ICD-10-CM | POA: Diagnosis not present

## 2020-03-11 ENCOUNTER — Other Ambulatory Visit: Payer: Self-pay

## 2020-03-11 ENCOUNTER — Ambulatory Visit (INDEPENDENT_AMBULATORY_CARE_PROVIDER_SITE_OTHER): Payer: Medicare Other | Admitting: Physician Assistant

## 2020-03-11 ENCOUNTER — Encounter: Payer: Self-pay | Admitting: Physician Assistant

## 2020-03-11 VITALS — BP 123/69 | HR 63 | Ht 70.0 in | Wt 213.1 lb

## 2020-03-11 DIAGNOSIS — E039 Hypothyroidism, unspecified: Secondary | ICD-10-CM | POA: Diagnosis not present

## 2020-03-11 DIAGNOSIS — Z87442 Personal history of urinary calculi: Secondary | ICD-10-CM

## 2020-03-11 DIAGNOSIS — I10 Essential (primary) hypertension: Secondary | ICD-10-CM | POA: Diagnosis not present

## 2020-03-11 DIAGNOSIS — R351 Nocturia: Secondary | ICD-10-CM | POA: Diagnosis not present

## 2020-03-11 DIAGNOSIS — E785 Hyperlipidemia, unspecified: Secondary | ICD-10-CM

## 2020-03-11 NOTE — Patient Instructions (Signed)

## 2020-03-11 NOTE — Assessment & Plan Note (Signed)
-  Last lipid panel: total cholesterol 231, triglycerides 136, HDL 59, LDL 148 -Continue Lipitor 20 mg. -Recommend to follow a heart healthy diet. -Increase physical activity.  -Plan to repeat lipid panel and hepatic function with [facility].

## 2020-03-11 NOTE — Assessment & Plan Note (Signed)
-  Asymptomatic. -Last TSH wnl -Continue current medication regimen. -Will continue to monitor and repeat TSH with [facility].

## 2020-03-11 NOTE — Assessment & Plan Note (Signed)
-  Controlled. -Continue current medication regimen. -Continue good hydration and monitor sodium. -Increase physical activity. -Will continue to monitor.

## 2020-03-11 NOTE — Progress Notes (Signed)
Established Patient Office Visit  Subjective:  Patient ID: Jordan Evans, male    DOB: 03/23/1951  Age: 69 y.o. MRN: [id number]  CC:  Chief Complaint  Patient presents with  . Hypertension  . Hyperlipidemia  . Hypothyroidism    HPI Jordan Evans presents for follow up on hypertension, hyperlipidemia and hypothyroidism. Reports had several procedures done by Urologist  In October which included 2 cyst removal. Urologist discontinued Flomax due to allergic reaction and started new medication, but unable to recall medication.    HTN: Pt denies chest pain, palpitations, dizziness, headache or lower extremityswelling. Taking medication as directed without side effects. Checks BP at home and readings range 120s/60-70s. Pt stays hydrated. Urologist recommended to decrease water consumption.   HLD: Pt taking medication as directed without issues. Denies side effects including myalgias and arthralgias. Has not been as active as before with stationary bike. Diet consists of red meat, chicken, some fish and vegetables.   Hypothyroid: Denies weight or bowel changes or fatigue. Reports medication compliance.  Past Medical History:  Diagnosis Date  . History of kidney stones   . Hyperlipidemia   . Hypertension   . Hypothyroidism   . Syncope   . Wears glasses     Past Surgical History:  Procedure Laterality Date  . COLONOSCOPY     x2  . CYSTOSCOPY WITH LITHOLAPAXY N/A 05/03/2019   Procedure: CYSTOSCOPY WITH LITHOLAPAXY WITH RIGHT PYELOGRAM;  Surgeon: [name], MD;  Location: [facility];  Service: Urology;  Laterality: N/A;  . CYSTOSCOPY WITH URETHRAL DILATATION N/A 05/03/2019   Procedure: CYSTOSCOPY WITH URETHRAL DILATATION;  Surgeon: [name], MD;  Location: [facility];  Service: Urology;  Laterality: N/A;  . EXTRACORPOREAL SHOCK WAVE LITHOTRIPSY Right 01/24/2017   Procedure: EXTRACORPOREAL SHOCK WAVE LITHOTRIPSY (ESWL);  Surgeon: Nickie Retort,  MD;  Location: [facility];  Service: Urology;  Laterality: Right;  . HERNIA REPAIR     umbilical hernia- [phone]  . LITHOTRIPSY    . ROTATOR CUFF REPAIR Right   . ROTATOR CUFF REPAIR Left   . UPPER GI ENDOSCOPY    . URETHRAL DILATION     x5   . VASECTOMY      Family History  Problem Relation Age of Onset  . Cancer Mother        ovarian  . Hyperlipidemia Mother   . Hypertension Mother   . Heart attack Father   . Hyperlipidemia Father   . Hypertension Father   . Hyperlipidemia Sister   . Hypertension Sister   . Cancer Brother        lymph  . Hyperlipidemia Brother   . Hypertension Brother   . Diabetes Paternal Grandmother   . Hyperlipidemia Brother   . Hypertension Brother   . Hyperlipidemia Brother   . Hypertension Brother   . Hyperlipidemia Brother   . Hypertension Brother     Social History   Socioeconomic History  . Marital status: Married    Spouse name: Not on file  . Number of children: Not on file  . Years of education: Not on file  . Highest education level: Not on file  Occupational History  . Not on file  Tobacco Use  . Smoking status: Never Smoker  . Smokeless tobacco: Never Used  Vaping Use  . Vaping Use: Never used  Substance and Sexual Activity  . Alcohol use: Yes    Comment: 1 beer weekly  . Drug use: Not Currently  .  Sexual activity: Yes    Birth control/protection: Surgical  Other Topics Concern  . Not on file  Social History Narrative   Lives with wife   's[employer], works for [employer]' resources   Children 5   Caffeine- 1 soda daily   Social Determinants of Radio broadcast assistant Strain:   . Difficulty of Paying Living Expenses: Not on file  Food Insecurity:   . Worried About Charity fundraiser in the Last Year: Not on file  . Ran Out of Food in the Last Year: Not on file  Transportation Needs:   . Lack of Transportation (Medical): Not on file  . Lack of Transportation (Non-Medical): Not on file  Physical Activity:   .  Days of Exercise per Week: Not on file  . Minutes of Exercise per Session: Not on file  Stress:   . Feeling of Stress : Not on file  Social Connections:   . Frequency of Communication with Friends and Family: Not on file  . Frequency of Social Gatherings with Friends and Family: Not on file  . Attends Religious Services: Not on file  . Active Member of Clubs or Organizations: Not on file  . Attends Archivist Meetings: Not on file  . Marital Status: Not on file  Intimate Partner Violence:   . Fear of Current or Ex-Partner: Not on file  . Emotionally Abused: Not on file  . Physically Abused: Not on file  . Sexually Abused: Not on file    Outpatient Medications Prior to Visit  Medication Sig Dispense Refill  . amLODipine (NORVASC) 10 MG tablet TAKE 1 TABLET BY MOUTH EVERY DAY 90 tablet 1  . atorvastatin (LIPITOR) 20 MG tablet Take 1 tablet (20 mg total) by mouth daily. 90 tablet 1  . cholecalciferol (VITAMIN D3) 25 MCG (1000 UNIT) tablet Take 1,000 Units by mouth daily.    Marland Kitchen levothyroxine (SYNTHROID) 75 MCG tablet TAKE 1 TABLET BY MOUTH EVERY DAY BEFORE BREAKFAST 90 tablet 0  . Magnesium 250 MG TABS Take 250 mg by mouth daily.    Marland Kitchen triamcinolone ointment (KENALOG) 0.1 % Apply 1 application topically as needed. (Patient not taking: Reported on 03/11/2020)  1   No facility-administered medications prior to visit.    Allergies  Allergen Reactions  . Percocet [Oxycodone-Acetaminophen]     Leg weakness  . Tamsulosin     Dropped blood pressure, near syncope    ROS Review of Systems A fourteen system review of systems was performed and found to be positive as per HPI.   Objective:    Physical Exam General:  Well Developed, well nourished, appropriate for stated age.  Neuro:  Alert and oriented,  extra-ocular muscles intact, no focal deficits   HEENT:  Normocephalic, atraumatic, neck supple Skin:  no gross rash, warm, pink. Cardiac:  RRR, S1 S2 Respiratory:  ECTA B/L  and A/P, Not using accessory muscles, speaking in full sentences- unlabored. Vascular:  Ext warm, no cyanosis apprec.; no gross edema Psych:  No HI/SI, judgement and insight good, Euthymic mood. Full Affect.   BP 123/69   Pulse 63   Ht 5\' 10"  (1.778 m)   Wt 213 lb 1.6 oz (96.7 kg)   SpO2 96%   BMI 30.58 kg/m  Wt Readings from Last 3 Encounters:  03/11/20 213 lb 1.6 oz (96.7 kg)  11/06/19 212 lb 11.2 oz (96.5 kg)  05/03/19 212 lb 3.2 oz (96.3 kg)     Health Maintenance Due  Topic Date Due  . INFLUENZA VACCINE  11/04/2019    There are no preventive care reminders to display for this patient.  Lab Results  Component Value Date   TSH 4.340 04/11/2019   Lab Results  Component Value Date   WBC 6.6 04/11/2019   HGB 18.4 (H) 05/03/2019   HCT 54.0 (H) 05/03/2019   MCV 86 04/11/2019   PLT 246 04/11/2019   Lab Results  Component Value Date   NA 141 11/06/2019   K 4.7 11/06/2019   CO2 26 11/06/2019   GLUCOSE 96 11/06/2019   BUN 13 11/06/2019   CREATININE 1.07 11/06/2019   BILITOT 0.6 11/06/2019   ALKPHOS 126 (H) 11/06/2019   AST 24 11/06/2019   ALT 24 11/06/2019   PROT 7.3 11/06/2019   ALBUMIN 4.7 11/06/2019   CALCIUM 8.9 11/06/2019   ANIONGAP 7 08/27/2017   Lab Results  Component Value Date   CHOL 231 (H) 11/06/2019   Lab Results  Component Value Date   HDL 59 11/06/2019   Lab Results  Component Value Date   LDLCALC 148 (H) 11/06/2019   Lab Results  Component Value Date   TRIG 136 11/06/2019   Lab Results  Component Value Date   CHOLHDL 3.9 11/06/2019   Lab Results  Component Value Date   HGBA1C 5.3 04/11/2019      Assessment & Plan:   Problem List Items Addressed This Visit      Cardiovascular and Mediastinum   HTN (hypertension) - Primary    -Controlled. -Continue current medication regimen. -Continue good hydration and monitor sodium. -Increase physical activity. -Will continue to monitor.        Endocrine   Hypothyroidism     -Asymptomatic. -Last TSH wnl -Continue current medication regimen. -Will continue to monitor and repeat TSH with [facility].        Other   Hyperlipidemia    -Last lipid panel: total cholesterol 231, triglycerides 136, HDL 59, LDL 148 -Continue Lipitor 20 mg. -Recommend to follow a heart healthy diet. -Increase physical activity.  -Plan to repeat lipid panel and hepatic function with [facility].        History of Nephrolithiasis, Nocturia: -Unable to locate most recent consultation noted. Reviewed most recent note in chart from 05/14/2019. -Continue to follow up with [facility]. -Recommend to send [facility] message or call the office with new medication for nocturia so medication list can be updated.    No orders of the defined types were placed in this encounter.   Follow-up: Return in about 4 months (around 07/10/2020) for [facility] and [facility] few days prior.   Note:  This note was prepared with assistance of Dragon voice recognition software. Occasional wrong-word or sound-a-like substitutions may have occurred due to the inherent limitations of voice recognition software.   Lorrene Reid, PA-C

## 2020-03-26 ENCOUNTER — Other Ambulatory Visit: Payer: Self-pay | Admitting: Physician Assistant

## 2020-03-26 DIAGNOSIS — E785 Hyperlipidemia, unspecified: Secondary | ICD-10-CM

## 2020-03-26 DIAGNOSIS — Z23 Encounter for immunization: Secondary | ICD-10-CM | POA: Diagnosis not present

## 2020-03-26 DIAGNOSIS — I10 Essential (primary) hypertension: Secondary | ICD-10-CM

## 2020-03-26 DIAGNOSIS — E039 Hypothyroidism, unspecified: Secondary | ICD-10-CM

## 2020-03-27 NOTE — Telephone Encounter (Signed)
Alternative Requested:REQUESTING 6 MONTHS FOR TRAVEL.

## 2020-03-27 NOTE — Telephone Encounter (Signed)
Alternative Requested:REQUESTING 6 MONTHS FOR TRAVEL.  Is this okay?  T. Meda Coffee, CMA

## 2020-03-27 NOTE — Telephone Encounter (Signed)
Pt request 6 months of medication for travel.  Is it okay to provide an RX for this quantity?  T. Meda Coffee, CMA

## 2020-04-01 MED ORDER — ATORVASTATIN CALCIUM 20 MG PO TABS: 20.0000 mg | ORAL_TABLET | Freq: Every day | ORAL | 0 refills | Status: DC

## 2020-04-01 NOTE — Addendum Note (Signed)
Addended by: Sylvester Harder on: 04/01/2020 02:38 PM   Modules accepted: Orders

## 2020-04-04 DIAGNOSIS — Z23 Encounter for immunization: Secondary | ICD-10-CM | POA: Diagnosis not present

## 2020-07-03 ENCOUNTER — Other Ambulatory Visit: Payer: Self-pay | Admitting: Physician Assistant

## 2020-07-03 DIAGNOSIS — E039 Hypothyroidism, unspecified: Secondary | ICD-10-CM

## 2020-07-03 DIAGNOSIS — E785 Hyperlipidemia, unspecified: Secondary | ICD-10-CM

## 2020-07-03 DIAGNOSIS — I10 Essential (primary) hypertension: Secondary | ICD-10-CM

## 2020-07-03 DIAGNOSIS — Z0000 Encounter for general adult medical examination without abnormal findings: Secondary | ICD-10-CM

## 2020-07-03 NOTE — Progress Notes (Signed)
Insurance will not cover A1C

## 2020-07-07 ENCOUNTER — Other Ambulatory Visit: Payer: Self-pay

## 2020-07-07 ENCOUNTER — Other Ambulatory Visit: Payer: Medicare Other

## 2020-07-07 DIAGNOSIS — E785 Hyperlipidemia, unspecified: Secondary | ICD-10-CM

## 2020-07-07 DIAGNOSIS — I10 Essential (primary) hypertension: Secondary | ICD-10-CM | POA: Diagnosis not present

## 2020-07-07 DIAGNOSIS — Z0000 Encounter for general adult medical examination without abnormal findings: Secondary | ICD-10-CM

## 2020-07-07 DIAGNOSIS — E039 Hypothyroidism, unspecified: Secondary | ICD-10-CM

## 2020-07-08 LAB — CBC
Hematocrit: 45.3 % (ref 37.5–51.0)
Hemoglobin: 15.5 g/dL (ref 13.0–17.7)
MCH: 29.1 pg (ref 26.6–33.0)
MCHC: 34.2 g/dL (ref 31.5–35.7)
MCV: 85 fL (ref 79–97)
Platelets: 221 10*3/uL (ref 150–450)
RBC: 5.33 x10E6/uL (ref 4.14–5.80)
RDW: 12.8 % (ref 11.6–15.4)
WBC: 5.5 10*3/uL (ref 3.4–10.8)

## 2020-07-08 LAB — COMPREHENSIVE METABOLIC PANEL
ALT: 20 IU/L (ref 0–44)
AST: 21 IU/L (ref 0–40)
Albumin/Globulin Ratio: 1.4 (ref 1.2–2.2)
Albumin: 4.3 g/dL (ref 3.8–4.8)
Alkaline Phosphatase: 109 IU/L (ref 44–121)
BUN/Creatinine Ratio: 12 (ref 10–24)
BUN: 12 mg/dL (ref 8–27)
Bilirubin Total: 0.8 mg/dL (ref 0.0–1.2)
CO2: 22 mmol/L (ref 20–29)
Calcium: 8.8 mg/dL (ref 8.6–10.2)
Chloride: 104 mmol/L (ref 96–106)
Creatinine, Ser: 0.99 mg/dL (ref 0.76–1.27)
Globulin, Total: 3 g/dL (ref 1.5–4.5)
Glucose: 96 mg/dL (ref 65–99)
Potassium: 4.1 mmol/L (ref 3.5–5.2)
Sodium: 142 mmol/L (ref 134–144)
Total Protein: 7.3 g/dL (ref 6.0–8.5)
eGFR: 83 mL/min/{1.73_m2} (ref >59)

## 2020-07-08 LAB — TSH: TSH: 2.5 u[IU]/mL (ref 0.450–4.500)

## 2020-07-08 LAB — LIPID PANEL
Chol/HDL Ratio: 3.3 ratio (ref 0.0–5.0)
Cholesterol, Total: 176 mg/dL (ref 100–199)
HDL: 54 mg/dL (ref >39)
LDL Chol Calc (NIH): 105 mg/dL — ABNORMAL HIGH (ref 0–99)
Triglycerides: 95 mg/dL (ref 0–149)
VLDL Cholesterol Cal: 17 mg/dL (ref 5–40)

## 2020-07-10 ENCOUNTER — Ambulatory Visit (INDEPENDENT_AMBULATORY_CARE_PROVIDER_SITE_OTHER): Payer: Medicare Other | Admitting: Physician Assistant

## 2020-07-10 ENCOUNTER — Encounter: Payer: Self-pay | Admitting: Physician Assistant

## 2020-07-10 ENCOUNTER — Other Ambulatory Visit: Payer: Self-pay

## 2020-07-10 VITALS — BP 132/78 | HR 63 | Temp 98.4°F | Ht 70.0 in | Wt 212.3 lb

## 2020-07-10 DIAGNOSIS — Z0000 Encounter for general adult medical examination without abnormal findings: Secondary | ICD-10-CM | POA: Diagnosis not present

## 2020-07-10 DIAGNOSIS — M79645 Pain in left finger(s): Secondary | ICD-10-CM

## 2020-07-10 NOTE — Progress Notes (Signed)
Subjective:   Jordan Evans is a 70 y.o. male who presents for Medicare Annual/Subsequent preventive examination.  Review of Systems    General:   No F/C, wt loss Pulm:   No DIB, SOB, pleuritic chest pain Card:  No CP, palpitations Abd:  No n/v/d or pain Ext:  No inc edema from baseline, +left thumb pain     Objective:    Today's Vitals   07/10/20 0926  BP: 132/78  Pulse: 63  Temp: 98.4 F (36.9 C)  SpO2: 97%  Weight: 212 lb 4.8 oz (96.3 kg)  Height: 5\' 10"  (1.778 m)   Body mass index is 30.46 kg/m.  Advanced Directives 05/03/2019 02/16/2018 03/12/2017 01/24/2017 08/31/2016  Does Patient Have a Medical Advance Directive? No No No No No  Would patient like information on creating a medical advance directive? Yes (Inpatient - patient defers creating a medical advance directive at this time - Information given) No - Patient declined No - Patient declined Yes (MAU/Ambulatory/Procedural Areas - Information given) Yes (MAU/Ambulatory/Procedural Areas - Information given)    Current Medications (verified) Outpatient Encounter Medications as of 07/10/2020  Medication Sig  . amLODipine (NORVASC) 10 MG tablet TAKE 1 TABLET BY MOUTH EVERY DAY  . atorvastatin (LIPITOR) 20 MG tablet Take 1 tablet (20 mg total) by mouth daily.  . cholecalciferol (VITAMIN D3) 25 MCG (1000 UNIT) tablet Take 1,000 Units by mouth daily.  Marland Kitchen levothyroxine (LEVO-T) 75 MCG tablet Take 1 tablet (75 mcg total) by mouth daily before breakfast.  . Magnesium 250 MG TABS Take 250 mg by mouth daily.   No facility-administered encounter medications on file as of 07/10/2020.    Allergies (verified) Percocet [oxycodone-acetaminophen] and Tamsulosin   History: Past Medical History:  Diagnosis Date  . History of kidney stones   . Hyperlipidemia   . Hypertension   . Hypothyroidism   . Syncope   . Wears glasses    Past Surgical History:  Procedure Laterality Date  . COLONOSCOPY     x2  . CYSTOSCOPY WITH  LITHOLAPAXY N/A 05/03/2019   Procedure: CYSTOSCOPY WITH LITHOLAPAXY WITH RIGHT PYELOGRAM;  Surgeon: [name], MD;  Location: [facility];  Service: Urology;  Laterality: N/A;  . CYSTOSCOPY WITH URETHRAL DILATATION N/A 05/03/2019   Procedure: CYSTOSCOPY WITH URETHRAL DILATATION;  Surgeon: [name], MD;  Location: [facility];  Service: Urology;  Laterality: N/A;  . EXTRACORPOREAL SHOCK WAVE LITHOTRIPSY Right 01/24/2017   Procedure: EXTRACORPOREAL SHOCK WAVE LITHOTRIPSY (ESWL);  Surgeon: Nickie Retort, MD;  Location: [facility];  Service: Urology;  Laterality: Right;  . HERNIA REPAIR     umbilical hernia- [id number]  . LITHOTRIPSY    . ROTATOR CUFF REPAIR Right   . ROTATOR CUFF REPAIR Left   . UPPER GI ENDOSCOPY    . URETHRAL DILATION     x5   . VASECTOMY     Family History  Problem Relation Age of Onset  . Cancer Mother        ovarian  . Hyperlipidemia Mother   . Hypertension Mother   . Heart attack Father   . Hyperlipidemia Father   . Hypertension Father   . Hyperlipidemia Sister   . Hypertension Sister   . Cancer Brother        lymph  . Hyperlipidemia Brother   . Hypertension Brother   . Diabetes Paternal Grandmother   . Hyperlipidemia Brother   . Hypertension Brother   . Hyperlipidemia Brother   .  Hypertension Brother   . Hyperlipidemia Brother   . Hypertension Brother    Social History   Socioeconomic History  . Marital status: Married    Spouse name: Not on file  . Number of children: Not on file  . Years of education: Not on file  . Highest education level: Not on file  Occupational History  . Not on file  Tobacco Use  . Smoking status: Never Smoker  . Smokeless tobacco: Never Used  Vaping Use  . Vaping Use: Never used  Substance and Sexual Activity  . Alcohol use: Yes    Comment: 1 beer weekly  . Drug use: Not Currently  . Sexual activity: Yes    Birth control/protection: Surgical  Other Topics Concern  . Not on file   Social History Narrative   Lives with wife   's[employer] college, works for [employer]   Children 5   Caffeine- 1 soda daily   Social Determinants of Radio broadcast assistant Strain: Not on file  Food Insecurity: Not on file  Transportation Needs: Not on file  Physical Activity: Not on file  Stress: Not on file  Social Connections: Not on file    Tobacco Counseling Counseling given: No    Diabetic? No         Activities of Daily Living In your present state of health, do you have any difficulty performing the following activities: 07/10/2020 03/11/2020  Hearing? N Y  Vision? N N  Difficulty concentrating or making decisions? N N  Walking or climbing stairs? N N  Dressing or bathing? N N  Doing errands, shopping? N N  Some recent data might be hidden    Patient Care Team: Lorrene Reid, PA-C as PCP - General (Physician Assistant) Mcarthur Rossetti, MD as Consulting Physician (Orthopedic Surgery)  Indicate any recent Medical Services you may have received from other than [facility] providers in the past year (date may be approximate).     Assessment:   This is a routine wellness examination for Jordan Evans.  Hearing/Vision screen No exam data present  Dietary issues and exercise activities discussed:  -Recommend to increase physical activity (use stationary bike) and follow a heart healthy diet.  Goals   None    Depression Screen PHQ 2/9 Scores 07/10/2020 03/11/2020 11/06/2019 04/18/2019 12/06/2018 10/09/2018 06/19/2018  PHQ - 2 Score 0 0 0 1 1 0 0  PHQ- 9 Score 0 0 0 2 1 0 0    Fall Risk Fall Risk  07/10/2020 03/11/2020 11/06/2019 04/18/2019 12/06/2018  Falls in the past year? 0 0 0 0 0  Number falls in past yr: - - - - -  Injury with Fall? - - - - -  Comment - - - - -  Risk for fall due to : - - - - -  Risk for fall due to: Comment - - - - -  Follow up Falls evaluation completed Falls evaluation completed Falls evaluation completed Falls evaluation completed  Falls evaluation completed    [place]:  Any stairs in or around the home? No  If so, are there any without handrails? No  Home free of loose throw rugs in walkways, pet beds, electrical cords, etc? No  Adequate lighting in your home to reduce risk of falls? Yes   ASSISTIVE DEVICES UTILIZED TO PREVENT FALLS:  Life alert? No  Use of a cane, walker or w/c? No  Grab bars in the bathroom? No  Shower chair or bench in shower? No  Elevated toilet seat or a handicapped toilet? No   TIMED UP AND GO:  Was the test performed? Yes .  Length of time to ambulate 10 feet: 10 sec.   Gait steady and fast without use of assistive device  Cognitive Function: wnl     6CIT Screen 07/10/2020 08/25/2017  What Year? 0 points 0 points  What month? 0 points 0 points  What time? 0 points 0 points  Count back from 20 0 points 0 points  Months in reverse 0 points 0 points  Repeat phrase 0 points 0 points  Total Score 0 0    Immunizations Immunization History  Administered Date(s) Administered  . Influenza, High Dose Seasonal PF 02/07/2017  . Influenza-Unspecified 02/07/2017, 03/08/2019  . Moderna Sars-Covid-2 Vaccination 06/29/2019, 07/30/2019  . Tdap 04/06/2011, 03/09/2017    TDAP status: Up to date  Flu Vaccine status: Due, Education has been provided regarding the importance of this vaccine. Advised may receive this vaccine at local pharmacy or Health Dept. Aware to provide a copy of the vaccination record if obtained from local pharmacy or Health Dept. Verbalized acceptance and understanding.  Pneumococcal vaccine status: Due, Education has been provided regarding the importance of this vaccine. Advised may receive this vaccine at local pharmacy or Health Dept. Aware to provide a copy of the vaccination record if obtained from local pharmacy or Health Dept. Verbalized acceptance and understanding.  Covid-19 vaccine status: Completed vaccines  Qualifies  for Shingles Vaccine? Yes   Zostavax completed No   Shingrix Completed?: No.    Education has been provided regarding the importance of this vaccine. Patient has been advised to call insurance company to determine out of pocket expense if they have not yet received this vaccine. Advised may also receive vaccine at local pharmacy or Health Dept. Verbalized acceptance and understanding.  Screening Tests Health Maintenance  Topic Date Due  . Hepatitis C Screening  Never done  . PNA vac Low Risk Adult (1 of 2 - PCV13) Never done  . COVID-19 Vaccine (3 - Booster for Moderna series) 01/29/2020  . INFLUENZA VACCINE  11/03/2020  . COLONOSCOPY (Pts 45-17yrs Insurance coverage will need to be confirmed)  06/14/2021  . TETANUS/TDAP  03/10/2027  . HPV VACCINES  Aged Out    Health Maintenance  Health Maintenance Due  Topic Date Due  . Hepatitis C Screening  Never done  . PNA vac Low Risk Adult (1 of 2 - PCV13) Never done  . COVID-19 Vaccine (3 - Booster for Moderna series) 01/29/2020    Colorectal cancer screening: Type of screening: Colonoscopy. Completed 06/14/2016. Repeat every 5 years  Lung Cancer Screening: (Low Dose CT Chest recommended if Age 68-80 years, 30 pack-year currently smoking OR have quit w/in 15years.) does not qualify.   Lung Cancer Screening Referral: n/a  Additional Screening:  Hepatitis C Screening: does qualify; Completed pt declined  Vision Screening: Recommended annual ophthalmology exams for early detection of glaucoma and other disorders of the eye. Is the patient up to date with their annual eye exam?  Yes  Who is the provider or what is the name of the office in which the patient attends annual eye exams? Unable to recall If pt is not established with a provider, would they like to be referred to a provider to establish care? No .   Dental Screening: Recommended annual dental exams for proper oral hygiene  Community Resource Referral / Chronic Care  Management:  CRR required this visit?  No   CCM required this visit?  No      Plan:  -Continue current medication regimen. -Continue to follow up with Urology -Discussed most recent labs which are essentially within normal limits or stable from prior. -Will place ortho referral for left thumb pain (hx of trauma in 02/2020). -Follow up in 4-5 months for HTN, HLD   I have personally reviewed and noted the following in the patient's chart:   . Medical and social history . Use of alcohol, tobacco or illicit drugs  . Current medications and supplements . Functional ability and status . Nutritional status . Physical activity . Advanced directives . List of other physicians . Hospitalizations, surgeries, and ER visits in previous 12 months . Vitals . Screenings to include cognitive, depression, and falls . Referrals and appointments  In addition, I have reviewed and discussed with patient certain preventive protocols, quality metrics, and best practice recommendations. A written personalized care plan for preventive services as well as general preventive health recommendations were provided to patient.     Lorrene Reid, PA-C   07/10/2020

## 2020-07-10 NOTE — Patient Instructions (Signed)
Preventive Care 65 Years and Older, Male Preventive care refers to lifestyle choices and visits with your health care provider that can promote health and wellness. This includes:  A yearly physical exam. This is also called an annual wellness visit.  Regular dental and eye exams.  Immunizations.  Screening for certain conditions.  Healthy lifestyle choices, such as: ? Eating a healthy diet. ? Getting regular exercise. ? Not using drugs or products that contain nicotine and tobacco. ? Limiting alcohol use. What can I expect for my preventive care visit? Physical exam Your health care provider will check your:  Height and weight. These may be used to calculate your BMI (body mass index). BMI is a measurement that tells if you are at a healthy weight.  Heart rate and blood pressure.  Body temperature.  Skin for abnormal spots. Counseling Your health care provider may ask you questions about your:  Past medical problems.  Family's medical history.  Alcohol, tobacco, and drug use.  Emotional well-being.  Home life and relationship well-being.  Sexual activity.  Diet, exercise, and sleep habits.  History of falls.  Memory and ability to understand (cognition).  Work and work environment.  Access to firearms. What immunizations do I need? Vaccines are usually given at various ages, according to a schedule. Your health care provider will recommend vaccines for you based on your age, medical history, and lifestyle or other factors, such as travel or where you work.   What tests do I need? Blood tests  Lipid and cholesterol levels. These may be checked every 5 years, or more often depending on your overall health.  Hepatitis C test.  Hepatitis B test. Screening  Lung cancer screening. You may have this screening every year starting at age 55 if you have a 30-pack-year history of smoking and currently smoke or have quit within the past 15 years.  Colorectal  cancer screening. ? All adults should have this screening starting at age 50 and continuing until age 75. ? Your health care provider may recommend screening at age 45 if you are at increased risk. ? You will have tests every 1-10 years, depending on your results and the type of screening test.  Prostate cancer screening. Recommendations will vary depending on your family history and other risks.  Genital exam to check for testicular cancer or hernias.  Diabetes screening. ? This is done by checking your blood sugar (glucose) after you have not eaten for a while (fasting). ? You may have this done every 1-3 years.  Abdominal aortic aneurysm (AAA) screening. You may need this if you are a current or former smoker.  STD (sexually transmitted disease) testing, if you are at risk. Follow these instructions at home: Eating and drinking  Eat a diet that includes fresh fruits and vegetables, whole grains, lean protein, and low-fat dairy products. Limit your intake of foods with high amounts of sugar, saturated fats, and salt.  Take vitamin and mineral supplements as recommended by your health care provider.  Do not drink alcohol if your health care provider tells you not to drink.  If you drink alcohol: ? Limit how much you have to 0-2 drinks a day. ? Be aware of how much alcohol is in your drink. In the [place] one drink equals one 12 oz bottle of beer (355 mL), one 5 oz glass of wine (148 mL), or one 1 oz glass of hard liquor (44 mL).   Lifestyle  Take daily care of your teeth   and gums. Brush your teeth every morning and night with fluoride toothpaste. Floss one time each day.  Stay active. Exercise for at least 30 minutes 5 or more days each week.  Do not use any products that contain nicotine or tobacco, such as cigarettes, e-cigarettes, and chewing tobacco. If you need help quitting, ask your health care provider.  Do not use drugs.  If you are sexually active, practice safe sex.  Use a condom or other form of protection to prevent STIs (sexually transmitted infections).  Talk with your health care provider about taking a low-dose aspirin or statin.  Find healthy ways to cope with stress, such as: ? Meditation, yoga, or listening to music. ? Journaling. ? Talking to a trusted person. ? Spending time with friends and family. Safety  Always wear your seat belt while driving or riding in a vehicle.  Do not drive: ? If you have been drinking alcohol. Do not ride with someone who has been drinking. ? When you are tired or distracted. ? While texting.  Wear a helmet and other protective equipment during sports activities.  If you have firearms in your house, make sure you follow all gun safety procedures. What's next?  Visit your health care provider once a year for an annual wellness visit.  Ask your health care provider how often you should have your eyes and teeth checked.  Stay up to date on all vaccines. This information is not intended to replace advice given to you by your health care provider. Make sure you discuss any questions you have with your health care provider. Document Revised: 12/19/2018 Document Reviewed: 03/16/2018 Elsevier Patient Education  2021 Elsevier Inc.  

## 2020-07-14 ENCOUNTER — Ambulatory Visit (INDEPENDENT_AMBULATORY_CARE_PROVIDER_SITE_OTHER): Payer: Medicare Other | Admitting: Physician Assistant

## 2020-07-14 ENCOUNTER — Encounter: Payer: Self-pay | Admitting: Physician Assistant

## 2020-07-14 ENCOUNTER — Ambulatory Visit (INDEPENDENT_AMBULATORY_CARE_PROVIDER_SITE_OTHER): Payer: Medicare Other

## 2020-07-14 DIAGNOSIS — M1812 Unilateral primary osteoarthritis of first carpometacarpal joint, left hand: Secondary | ICD-10-CM

## 2020-07-14 MED ORDER — LIDOCAINE HCL 1 % IJ SOLN
0.5000 mL | INTRAMUSCULAR | Status: AC | PRN
  Administered 2020-07-14: .5 mL

## 2020-07-14 MED ORDER — METHYLPREDNISOLONE ACETATE 40 MG/ML IJ SUSP
20.0000 mg | INTRAMUSCULAR | Status: AC | PRN
  Administered 2020-07-14: 20 mg

## 2020-07-14 NOTE — Progress Notes (Signed)
Office Visit Note   Patient: Jordan Evans           Date of Birth: 06-02-1950           MRN: [id number] Visit Date: 07/14/2020              Requested by: Lorrene Reid, PA-C [place],   [id number] PCP: Lorrene Reid, PA-C   Assessment & Plan: Visit Diagnoses:  1. Arthritis of carpometacarpal (CMC) joint of left thumb     Plan: We will have him apply Voltaren gel 2 g up to 4 times daily to the left thumb CMC joint area.  Like to see him back in 2 weeks to see how he is doing.  Questions encouraged and answered at length.  Follow-Up Instructions: Return in about 2 weeks (around 07/28/2020).   Orders:  Orders Placed This Encounter  Procedures  . Hand/UE Inj  . XR Finger Thumb Left   No orders of the defined types were placed in this encounter.     Procedures: Hand/UE Inj: L thumb CMC for osteoarthritis on 07/14/2020 6:11 PM Medications: 0.5 mL lidocaine 1 %; 20 mg methylPREDNISolone acetate 40 MG/ML      Clinical Data: No additional findings.   Subjective: Chief Complaint  Patient presents with  . Left Thumb - Pain    HPI Jordan Evans is a pleasant 70 year old male were seen for left thumb pain.  He is well-known to Dr. Ninfa Linden service.  Left thumb pain began in November while holding a piece of wood while drilling the wood.  Had pain since then he tried ice without any relief.  Pain is base of the thumb.  Denies any catching locking of the thumb.  No prior pain or injury to the thumb. Review of Systems See HPI otherwise negative or noncontributory.  Objective: Vital Signs: There were no vitals taken for this visit.  Physical Exam General: Well-developed well-nourished male no acute distress. Psych: Alert and oriented x3 Ortho Exam Bilateral hands full range of motion.  Full sensation.  There is no catching locking particularly of the left thumb.  He is nontender.  The region of the left thumb A1 pulley.  He is tender at the  base of the left thumb CMC joint region.  Negative grind test and Finkelstein's left thumb.  Nontender over the first extensor compartment of the left thumb. Specialty Comments:  No specialty comments available.  Imaging: XR Finger Thumb Left  Result Date: 07/14/2020 Left thumb 3 view: Moderate arthritic changes involving the left thumb.  Thumb is well located.  No acute fractures or bony abnormalities.    PMFS History: Patient Active Problem List   Diagnosis Date Noted  . BMI 26.0-26.9,adult 12/13/2017  . Irregular heartbeat 09/08/2017  . Encounter for screening fecal occult blood testing 08/25/2017  . Acute bronchitis 07/06/2017  . Screening for AAA (abdominal aortic aneurysm) 06/16/2017  . Hypothyroidism 06/16/2017  . Dizziness 03/17/2017  . Vasovagal syncope 03/17/2017  . Yeast infection 12/14/2016  . H/O multiple pulmonary nodules 12/14/2016  . Health care maintenance 08/31/2016  . Kidney calculi 08/31/2016  . Hernia of abdominal cavity 08/31/2016  . HTN (hypertension) 08/31/2016  . Hyperlipidemia 08/31/2016   Past Medical History:  Diagnosis Date  . History of kidney stones   . Hyperlipidemia   . Hypertension   . Hypothyroidism   . Syncope   . Wears glasses     Family History  Problem Relation Age of  Onset  . Cancer Mother        ovarian  . Hyperlipidemia Mother   . Hypertension Mother   . Heart attack Father   . Hyperlipidemia Father   . Hypertension Father   . Hyperlipidemia Sister   . Hypertension Sister   . Cancer Brother        lymph  . Hyperlipidemia Brother   . Hypertension Brother   . Diabetes Paternal Grandmother   . Hyperlipidemia Brother   . Hypertension Brother   . Hyperlipidemia Brother   . Hypertension Brother   . Hyperlipidemia Brother   . Hypertension Brother     Past Surgical History:  Procedure Laterality Date  . COLONOSCOPY     x2  . CYSTOSCOPY WITH LITHOLAPAXY N/A 05/03/2019   Procedure: CYSTOSCOPY WITH LITHOLAPAXY WITH RIGHT  PYELOGRAM;  Surgeon: [name], MD;  Location: [facility];  Service: Urology;  Laterality: N/A;  . CYSTOSCOPY WITH URETHRAL DILATATION N/A 05/03/2019   Procedure: CYSTOSCOPY WITH URETHRAL DILATATION;  Surgeon: [name], MD;  Location[facility];  Service: Urology;  Laterality: N/A;  . EXTRACORPOREAL SHOCK WAVE LITHOTRIPSY Right 01/24/2017   Procedure: EXTRACORPOREAL SHOCK WAVE LITHOTRIPSY (ESWL);  Surgeon: Nickie Retort, MD;  Location: [facility];  Service: Urology;  Laterality: Right;  . HERNIA REPAIR     umbilical hernia- [id number]  . LITHOTRIPSY    . ROTATOR CUFF REPAIR Right   . ROTATOR CUFF REPAIR Left   . UPPER GI ENDOSCOPY    . URETHRAL DILATION     x5   . VASECTOMY     Social History   Occupational History  . Not on file  Tobacco Use  . Smoking status: Never Smoker  . Smokeless tobacco: Never Used  Vaping Use  . Vaping Use: Never used  Substance and Sexual Activity  . Alcohol use: Yes    Comment: 1 beer weekly  . Drug use: Not Currently  . Sexual activity: Yes    Birth control/protection: Surgical

## 2020-07-31 DIAGNOSIS — T1511XA Foreign body in conjunctival sac, right eye, initial encounter: Secondary | ICD-10-CM | POA: Diagnosis not present

## 2020-09-22 ENCOUNTER — Other Ambulatory Visit: Payer: Self-pay | Admitting: Physician Assistant

## 2020-09-22 DIAGNOSIS — E039 Hypothyroidism, unspecified: Secondary | ICD-10-CM

## 2020-10-12 DIAGNOSIS — U071 COVID-19: Secondary | ICD-10-CM | POA: Diagnosis not present

## 2020-10-16 ENCOUNTER — Other Ambulatory Visit: Payer: Self-pay | Admitting: Physician Assistant

## 2020-10-16 DIAGNOSIS — E785 Hyperlipidemia, unspecified: Secondary | ICD-10-CM

## 2020-11-11 ENCOUNTER — Ambulatory Visit (INDEPENDENT_AMBULATORY_CARE_PROVIDER_SITE_OTHER): Payer: Medicare Other | Admitting: Physician Assistant

## 2020-11-11 ENCOUNTER — Other Ambulatory Visit: Payer: Self-pay

## 2020-11-11 ENCOUNTER — Encounter: Payer: Self-pay | Admitting: Physician Assistant

## 2020-11-11 VITALS — BP 123/68 | HR 67 | Temp 98.1°F | Ht 70.0 in | Wt 212.1 lb

## 2020-11-11 DIAGNOSIS — I10 Essential (primary) hypertension: Secondary | ICD-10-CM

## 2020-11-11 DIAGNOSIS — Z0000 Encounter for general adult medical examination without abnormal findings: Secondary | ICD-10-CM | POA: Diagnosis not present

## 2020-11-11 DIAGNOSIS — E039 Hypothyroidism, unspecified: Secondary | ICD-10-CM

## 2020-11-11 DIAGNOSIS — R5383 Other fatigue: Secondary | ICD-10-CM | POA: Diagnosis not present

## 2020-11-11 DIAGNOSIS — E785 Hyperlipidemia, unspecified: Secondary | ICD-10-CM | POA: Diagnosis not present

## 2020-11-11 DIAGNOSIS — R600 Localized edema: Secondary | ICD-10-CM | POA: Diagnosis not present

## 2020-11-11 NOTE — Assessment & Plan Note (Signed)
-  Controlled. Continue current medication regimen. Will continue to monitor. 

## 2020-11-11 NOTE — Assessment & Plan Note (Signed)
-  Last TSH wnl -Continue current medication regimen. -Rechecking thyroid labs today. Pending results will make medication adjustments if indicated.  

## 2020-11-11 NOTE — Patient Instructions (Signed)
https://www.nhlbi.nih.gov/files/docs/public/heart/dash_brief.pdf">  DASH Eating Plan DASH stands for Dietary Approaches to Stop Hypertension. The DASH eating plan is a healthy eating plan that has been shown to: Reduce high blood pressure (hypertension). Reduce your risk for type 2 diabetes, heart disease, and stroke. Help with weight loss. What are tips for following this plan? Reading food labels Check food labels for the amount of salt (sodium) per serving. Choose foods with less than 5 percent of the Daily Value of sodium. Generally, foods with less than 300 milligrams (mg) of sodium per serving fit into this eating plan. To find whole grains, look for the word "whole" as the first word in the ingredient list. Shopping Buy products labeled as "low-sodium" or "no salt added." Buy fresh foods. Avoid canned foods and pre-made or frozen meals. Cooking Avoid adding salt when cooking. Use salt-free seasonings or herbs instead of table salt or sea salt. Check with your health care provider or pharmacist before using salt substitutes. Do not fry foods. Cook foods using healthy methods such as baking, boiling, grilling, roasting, and broiling instead. Cook with heart-healthy oils, such as olive, canola, avocado, soybean, or sunflower oil. Meal planning  Eat a balanced diet that includes: 4 or more servings of fruits and 4 or more servings of vegetables each day. Try to fill one-half of your plate with fruits and vegetables. 6-8 servings of whole grains each day. Less than 6 oz (170 g) of lean meat, poultry, or fish each day. A 3-oz (85-g) serving of meat is about the same size as a deck of cards. One egg equals 1 oz (28 g). 2-3 servings of low-fat dairy each day. One serving is 1 cup (237 mL). 1 serving of nuts, seeds, or beans 5 times each week. 2-3 servings of heart-healthy fats. Healthy fats called omega-3 fatty acids are found in foods such as walnuts, flaxseeds, fortified milks, and eggs.  These fats are also found in cold-water fish, such as sardines, salmon, and mackerel. Limit how much you eat of: Canned or prepackaged foods. Food that is high in trans fat, such as some fried foods. Food that is high in saturated fat, such as fatty meat. Desserts and other sweets, sugary drinks, and other foods with added sugar. Full-fat dairy products. Do not salt foods before eating. Do not eat more than 4 egg yolks a week. Try to eat at least 2 vegetarian meals a week. Eat more home-cooked food and less restaurant, buffet, and fast food.  Lifestyle When eating at a restaurant, ask that your food be prepared with less salt or no salt, if possible. If you drink alcohol: Limit how much you use to: 0-1 drink a day for women who are not pregnant. 0-2 drinks a day for men. Be aware of how much alcohol is in your drink. In the [place] one drink equals one 12 oz bottle of beer (355 mL), one 5 oz glass of wine (148 mL), or one 1 oz glass of hard liquor (44 mL). General information Avoid eating more than 2,300 mg of salt a day. If you have hypertension, you may need to reduce your sodium intake to 1,500 mg a day. Work with your health care provider to maintain a healthy body weight or to lose weight. Ask what an ideal weight is for you. Get at least 30 minutes of exercise that causes your heart to beat faster (aerobic exercise) most days of the week. Activities may include walking, swimming, or biking. Work with your health care provider   or dietitian to adjust your eating plan to your individual calorie needs. What foods should I eat? Fruits All fresh, dried, or frozen fruit. Canned fruit in natural juice (without addedsugar). Vegetables Fresh or frozen vegetables (raw, steamed, roasted, or grilled). Low-sodium or reduced-sodium tomato and vegetable juice. Low-sodium or reduced-sodium tomatosauce and tomato paste. Low-sodium or reduced-sodium canned vegetables. Grains Whole-grain or  whole-wheat bread. Whole-grain or whole-wheat pasta. Brown rice. Oatmeal. Quinoa. Bulgur. Whole-grain and low-sodium cereals. Pita bread.Low-fat, low-sodium crackers. Whole-wheat flour tortillas. Meats and other proteins Skinless chicken or turkey. Ground chicken or turkey. Pork with fat trimmed off. Fish and seafood. Egg whites. Dried beans, peas, or lentils. Unsalted nuts, nut butters, and seeds. Unsalted canned beans. Lean cuts of beef with fat trimmed off. Low-sodium, lean precooked or cured meat, such as sausages or meatloaves. Dairy Low-fat (1%) or fat-free (skim) milk. Reduced-fat, low-fat, or fat-free cheeses. Nonfat, low-sodium ricotta or cottage cheese. Low-fat or nonfatyogurt. Low-fat, low-sodium cheese. Fats and oils Soft margarine without trans fats. Vegetable oil. Reduced-fat, low-fat, or light mayonnaise and salad dressings (reduced-sodium). Canola, safflower, olive, avocado, soybean, andsunflower oils. Avocado. Seasonings and condiments Herbs. Spices. Seasoning mixes without salt. Other foods Unsalted popcorn and pretzels. Fat-free sweets. The items listed above may not be a complete list of foods and beverages you can eat. Contact a dietitian for more information. What foods should I avoid? Fruits Canned fruit in a light or heavy syrup. Fried fruit. Fruit in cream or buttersauce. Vegetables Creamed or fried vegetables. Vegetables in a cheese sauce. Regular canned vegetables (not low-sodium or reduced-sodium). Regular canned tomato sauce and paste (not low-sodium or reduced-sodium). Regular tomato and vegetable juice(not low-sodium or reduced-sodium). Pickles. Olives. Grains Baked goods made with fat, such as croissants, muffins, or some breads. Drypasta or rice meal packs. Meats and other proteins Fatty cuts of meat. Ribs. Fried meat. Bacon. Bologna, salami, and other precooked or cured meats, such as sausages or meat loaves. Fat from the back of a pig (fatback). Bratwurst.  Salted nuts and seeds. Canned beans with added salt. Canned orsmoked fish. Whole eggs or egg yolks. Chicken or turkey with skin. Dairy Whole or 2% milk, cream, and half-and-half. Whole or full-fat cream cheese. Whole-fat or sweetened yogurt. Full-fat cheese. Nondairy creamers. Whippedtoppings. Processed cheese and cheese spreads. Fats and oils Butter. Stick margarine. Lard. Shortening. Ghee. Bacon fat. Tropical oils, suchas coconut, palm kernel, or palm oil. Seasonings and condiments Onion salt, garlic salt, seasoned salt, table salt, and sea salt. Worcestershire sauce. Tartar sauce. Barbecue sauce. Teriyaki sauce. Soy sauce, including reduced-sodium. Steak sauce. Canned and packaged gravies. Fish sauce. Oyster sauce. Cocktail sauce. Store-bought horseradish. Ketchup. Mustard. Meat flavorings and tenderizers. Bouillon cubes. Hot sauces. Pre-made or packaged marinades. Pre-made or packaged taco seasonings. Relishes. Regular saladdressings. Other foods Salted popcorn and pretzels. The items listed above may not be a complete list of foods and beverages you should avoid. Contact a dietitian for more information. Where to find more information National Heart, Lung, and Blood Institute: www.nhlbi.nih.gov American Heart Association: www.heart.org Academy of Nutrition and Dietetics: www.eatright.org National Kidney Foundation: www.kidney.org Summary The DASH eating plan is a healthy eating plan that has been shown to reduce high blood pressure (hypertension). It may also reduce your risk for type 2 diabetes, heart disease, and stroke. When on the DASH eating plan, aim to eat more fresh fruits and vegetables, whole grains, lean proteins, low-fat dairy, and heart-healthy fats. With the DASH eating plan, you should limit salt (sodium) intake to 2,300   mg a day. If you have hypertension, you may need to reduce your sodium intake to 1,500 mg a day. Work with your health care provider or dietitian to adjust  your eating plan to your individual calorie needs. This information is not intended to replace advice given to you by your health care provider. Make sure you discuss any questions you have with your healthcare provider. Document Revised: 02/23/2019 Document Reviewed: 02/23/2019 Elsevier Patient Education  2022 Elsevier Inc.  

## 2020-11-11 NOTE — Progress Notes (Signed)
Established Patient Office Visit  Subjective:  Patient ID: Jordan Evans, male    DOB: [birth year]  Age: 70 y.o. MRN: [id number]  CC:  Chief Complaint  Patient presents with   Hypertension   Hyperlipidemia    HPI Jordan Evans presents for follow up on hypertension and hyperlipidemia.  HTN: Pt denies chest pain, new palpitations (reports chronic intermittent palpitations since his 30s), dizziness or shortness of breath. States did have significant lower extremity swelling with recent travel/road trip but seems to be back at baseline. Taking medication as directed without side effects. Patient continues with low sodium diet and good hydration.  HLD: Pt taking medication as directed without issues. Reports has started using his stationary bike 3 weeks ago, does 6-10 miles/day.   Hypothyroidism: Reports always has cold intolerance and feels tired. Reports medication compliance. States usually has to take a nap during the day and does endorse snoring. Has not had a sleep study in the past.  Past Medical History:  Diagnosis Date   History of kidney stones    Hyperlipidemia    Hypertension    Hypothyroidism    Syncope    Wears glasses     Past Surgical History:  Procedure Laterality Date   COLONOSCOPY     x2   CYSTOSCOPY WITH LITHOLAPAXY N/A 05/03/2019   Procedure: CYSTOSCOPY WITH LITHOLAPAXY WITH RIGHT PYELOGRAM;  Surgeon: [name], MD;  Location[facility];  Service: Urology;  Laterality: N/A;   CYSTOSCOPY WITH URETHRAL DILATATION N/A 05/03/2019   Procedure: CYSTOSCOPY WITH URETHRAL DILATATION;  Surgeon: [name], MD;  Location: [facility];  Service: Urology;  Laterality: N/A;   EXTRACORPOREAL SHOCK WAVE LITHOTRIPSY Right 01/24/2017   Procedure: EXTRACORPOREAL SHOCK WAVE LITHOTRIPSY (ESWL);  Surgeon: Nickie Retort, MD;  Location: [facility];  Service: Urology;  Laterality: Right;   HERNIA REPAIR     umbilical hernia- [id number]   LITHOTRIPSY      ROTATOR CUFF REPAIR Right    ROTATOR CUFF REPAIR Left    UPPER GI ENDOSCOPY     URETHRAL DILATION     x5    VASECTOMY      Family History  Problem Relation Age of Onset   Cancer Mother        ovarian   Hyperlipidemia Mother    Hypertension Mother    Heart attack Father    Hyperlipidemia Father    Hypertension Father    Hyperlipidemia Sister    Hypertension Sister    Cancer Brother        lymph   Hyperlipidemia Brother    Hypertension Brother    Diabetes Paternal Grandmother    Hyperlipidemia Brother    Hypertension Brother    Hyperlipidemia Brother    Hypertension Brother    Hyperlipidemia Brother    Hypertension Brother     Social History   Socioeconomic History   Marital status: Married    Spouse name: Not on file   Number of children: Not on file   Years of education: Not on file   Highest education level: Not on file  Occupational History   Not on file  Tobacco Use   Smoking status: Never   Smokeless tobacco: Never  Vaping Use   Vaping Use: Never used  Substance and Sexual Activity   Alcohol use: Yes    Comment: 1 beer weekly   Drug use: Not Currently   Sexual activity: Yes    Birth control/protection: Surgical  Other  Topics Concern   Not on file  Social History Narrative   Lives with wife   's[employer] college, works for [employer]   Children 5   Caffeine- 1 soda daily   Social Determinants of Radio broadcast assistant Strain: Not on file  Food Insecurity: Not on file  Transportation Needs: Not on file  Physical Activity: Not on file  Stress: Not on file  Social Connections: Not on file  Intimate Partner Violence: Not on file    Outpatient Medications Prior to Visit  Medication Sig Dispense Refill   amLODipine (NORVASC) 10 MG tablet TAKE 1 TABLET BY MOUTH EVERY DAY 180 tablet 0   atorvastatin (LIPITOR) 20 MG tablet TAKE 1 TABLET BY MOUTH EVERY DAY 90 tablet 0   cholecalciferol (VITAMIN D3) 25 MCG (1000 UNIT) tablet Take 1,000  Units by mouth daily.     levothyroxine (SYNTHROID) 75 MCG tablet TAKE 1 TABLET BY MOUTH DAILY BEFORE BREAKFAST. 90 tablet 1   Magnesium 250 MG TABS Take 250 mg by mouth daily.     No facility-administered medications prior to visit.    Allergies  Allergen Reactions   Percocet [Oxycodone-Acetaminophen]     Leg weakness   Tamsulosin     Dropped blood pressure, near syncope    ROS Review of Systems Review of Systems:  A fourteen system review of systems was performed and found to be positive as per HPI.   Objective:    Physical Exam General:  Well Developed, well nourished, in no acute distress  Neuro:  Alert and oriented,  extra-ocular muscles intact  HEENT:  Normocephalic, atraumatic, neck supple Skin:  no gross rash, warm, pink. Cardiac:  RRR Respiratory:  CTA B/L, Not using accessory muscles, speaking in full sentences- unlabored. Vascular:  Ext warm, no cyanosis apprec.; cap RF less 2 sec. Trace of pitting edema R>L Psych:  No HI/SI, judgement and insight good, Euthymic mood. Full Affect.  BP 123/68   Pulse 67   Temp 98.1 F (36.7 C)   Ht 5' 10" (1.778 m)   Wt 212 lb 1.6 oz (96.2 kg)   SpO2 96%   BMI 30.43 kg/m  Wt Readings from Last 3 Encounters:  11/11/20 212 lb 1.6 oz (96.2 kg)  07/10/20 212 lb 4.8 oz (96.3 kg)  03/11/20 213 lb 1.6 oz (96.7 kg)     Health Maintenance Due  Topic Date Due   Hepatitis C Screening  Never done   Zoster Vaccines- Shingrix (1 of 2) Never done   PNA vac Low Risk Adult (1 of 2 - PCV13) Never done   COVID-19 Vaccine (3 - Booster for Moderna series) 12/30/2019   INFLUENZA VACCINE  11/03/2020    There are no preventive care reminders to display for this patient.  Lab Results  Component Value Date   TSH 2.500 07/07/2020   Lab Results  Component Value Date   WBC 5.5 07/07/2020   HGB 15.5 07/07/2020   HCT 45.3 07/07/2020   MCV 85 07/07/2020   PLT 221 07/07/2020   Lab Results  Component Value Date   NA 142 07/07/2020    K 4.1 07/07/2020   CO2 22 07/07/2020   GLUCOSE 96 07/07/2020   BUN 12 07/07/2020   CREATININE 0.99 07/07/2020   BILITOT 0.8 07/07/2020   ALKPHOS 109 07/07/2020   AST 21 07/07/2020   ALT 20 07/07/2020   PROT 7.3 07/07/2020   ALBUMIN 4.3 07/07/2020   CALCIUM 8.8 07/07/2020   ANIONGAP 7  08/27/2017   EGFR 83 07/07/2020   Lab Results  Component Value Date   CHOL 176 07/07/2020   Lab Results  Component Value Date   HDL 54 07/07/2020   Lab Results  Component Value Date   LDLCALC 105 (H) 07/07/2020   Lab Results  Component Value Date   TRIG 95 07/07/2020   Lab Results  Component Value Date   CHOLHDL 3.3 07/07/2020   Lab Results  Component Value Date   HGBA1C 5.3 04/11/2019      Assessment & Plan:   Problem List Items Addressed This Visit       Cardiovascular and Mediastinum   HTN (hypertension)    -Controlled. -Continue current medication regimen. -Will continue to monitor.       Relevant Orders   CBC   Comprehensive metabolic panel   TSH   Lipid panel     Endocrine   Hypothyroidism - Primary    -Last TSH wnl -Continue current medication regimen. -Rechecking thyroid labs today. Pending results will make medication adjustments if indicated.        Relevant Orders   CBC   Comprehensive metabolic panel   TSH   Lipid panel     Other   Hyperlipidemia    -Last lipid panel: total cholesterol 176, triglycerides 95, HDL 54, LDL 105 -Continue current medication regimen. -Recommend to continue with weight loss efforts and follow a low fat diet. -Will repeat lipid panel and hepatic function today.       Relevant Orders   CBC   Comprehensive metabolic panel   TSH   Lipid panel   Other Visit Diagnoses     Healthcare maintenance       Relevant Orders   CBC   Comprehensive metabolic panel   TSH   Lipid panel   Lower extremity edema       Other fatigue          Lower extremity edema: -Discussed with patient acute worsening edema likely  result from dependent edema due to long hours of sitting and driving. -Recommend to continue with low sodium diet, elevation and use knee-high compressions. -Will continue to monitor.  Other fatigue: -Will collect labs to evaluate for potential metabolic or endocrine etiologies. Discussed with patient recommend to consider sleep study to r/o sleep apnea.   No orders of the defined types were placed in this encounter.   Follow-up: Return in about 6 months (around 05/14/2021) for HTN, HLD.   Note:  This note was prepared with assistance of Dragon voice recognition software. Occasional wrong-word or sound-a-like substitutions may have occurred due to the inherent limitations of voice recognition software.  Lorrene Reid, PA-C

## 2020-11-11 NOTE — Assessment & Plan Note (Signed)
-  Last lipid panel: total cholesterol 176, triglycerides 95, HDL 54, LDL 105 -Continue current medication regimen. -Recommend to continue with weight loss efforts and follow a low fat diet. -Will repeat lipid panel and hepatic function today.

## 2020-11-12 LAB — CBC
Hematocrit: 47.8 % (ref 37.5–51.0)
Hemoglobin: 16.3 g/dL (ref 13.0–17.7)
MCH: 29.3 pg (ref 26.6–33.0)
MCHC: 34.1 g/dL (ref 31.5–35.7)
MCV: 86 fL (ref 79–97)
Platelets: 241 10*3/uL (ref 150–450)
RBC: 5.56 x10E6/uL (ref 4.14–5.80)
RDW: 12.4 % (ref 11.6–15.4)
WBC: 5.9 10*3/uL (ref 3.4–10.8)

## 2020-11-12 LAB — COMPREHENSIVE METABOLIC PANEL
ALT: 18 IU/L (ref 0–44)
AST: 19 IU/L (ref 0–40)
Albumin/Globulin Ratio: 1.9 (ref 1.2–2.2)
Albumin: 4.7 g/dL (ref 3.8–4.8)
Alkaline Phosphatase: 112 IU/L (ref 44–121)
BUN/Creatinine Ratio: 12 (ref 10–24)
BUN: 13 mg/dL (ref 8–27)
Bilirubin Total: 0.8 mg/dL (ref 0.0–1.2)
CO2: 22 mmol/L (ref 20–29)
Calcium: 9.6 mg/dL (ref 8.6–10.2)
Chloride: 103 mmol/L (ref 96–106)
Creatinine, Ser: 1.07 mg/dL (ref 0.76–1.27)
Globulin, Total: 2.5 g/dL (ref 1.5–4.5)
Glucose: 95 mg/dL (ref 65–99)
Potassium: 4.4 mmol/L (ref 3.5–5.2)
Sodium: 139 mmol/L (ref 134–144)
Total Protein: 7.2 g/dL (ref 6.0–8.5)
eGFR: 76 mL/min/{1.73_m2} (ref >59)

## 2020-11-12 LAB — LIPID PANEL
Chol/HDL Ratio: 3 ratio (ref 0.0–5.0)
Cholesterol, Total: 172 mg/dL (ref 100–199)
HDL: 58 mg/dL (ref >39)
LDL Chol Calc (NIH): 97 mg/dL (ref 0–99)
Triglycerides: 91 mg/dL (ref 0–149)
VLDL Cholesterol Cal: 17 mg/dL (ref 5–40)

## 2020-11-12 LAB — TSH: TSH: 3.01 u[IU]/mL (ref 0.450–4.500)

## 2020-12-02 ENCOUNTER — Other Ambulatory Visit: Payer: Self-pay | Admitting: Physician Assistant

## 2020-12-02 DIAGNOSIS — I10 Essential (primary) hypertension: Secondary | ICD-10-CM

## 2020-12-14 DIAGNOSIS — U071 COVID-19: Secondary | ICD-10-CM | POA: Diagnosis not present

## 2021-01-16 ENCOUNTER — Other Ambulatory Visit: Payer: Self-pay | Admitting: Physician Assistant

## 2021-01-16 DIAGNOSIS — E785 Hyperlipidemia, unspecified: Secondary | ICD-10-CM

## 2021-02-02 DIAGNOSIS — N481 Balanitis: Secondary | ICD-10-CM | POA: Diagnosis not present

## 2021-02-02 DIAGNOSIS — Z23 Encounter for immunization: Secondary | ICD-10-CM | POA: Diagnosis not present

## 2021-02-09 DIAGNOSIS — N481 Balanitis: Secondary | ICD-10-CM | POA: Diagnosis not present

## 2021-02-09 DIAGNOSIS — R3912 Poor urinary stream: Secondary | ICD-10-CM | POA: Diagnosis not present

## 2021-02-09 DIAGNOSIS — Z125 Encounter for screening for malignant neoplasm of prostate: Secondary | ICD-10-CM | POA: Diagnosis not present

## 2021-03-12 ENCOUNTER — Other Ambulatory Visit: Payer: Self-pay | Admitting: Physician Assistant

## 2021-03-12 DIAGNOSIS — E039 Hypothyroidism, unspecified: Secondary | ICD-10-CM

## 2021-04-11 ENCOUNTER — Other Ambulatory Visit: Payer: Self-pay | Admitting: Physician Assistant

## 2021-04-11 DIAGNOSIS — E785 Hyperlipidemia, unspecified: Secondary | ICD-10-CM

## 2021-04-13 DIAGNOSIS — E785 Hyperlipidemia, unspecified: Secondary | ICD-10-CM | POA: Diagnosis not present

## 2021-04-13 DIAGNOSIS — E039 Hypothyroidism, unspecified: Secondary | ICD-10-CM | POA: Diagnosis not present

## 2021-04-13 DIAGNOSIS — I700 Atherosclerosis of aorta: Secondary | ICD-10-CM | POA: Diagnosis not present

## 2021-04-13 DIAGNOSIS — N480 Leukoplakia of penis: Secondary | ICD-10-CM | POA: Diagnosis not present

## 2021-04-13 DIAGNOSIS — I10 Essential (primary) hypertension: Secondary | ICD-10-CM | POA: Diagnosis not present

## 2021-04-13 DIAGNOSIS — N200 Calculus of kidney: Secondary | ICD-10-CM | POA: Diagnosis not present

## 2021-04-13 DIAGNOSIS — M6208 Separation of muscle (nontraumatic), other site: Secondary | ICD-10-CM | POA: Diagnosis not present

## 2021-04-13 DIAGNOSIS — M65332 Trigger finger, left middle finger: Secondary | ICD-10-CM | POA: Diagnosis not present

## 2021-04-13 DIAGNOSIS — R918 Other nonspecific abnormal finding of lung field: Secondary | ICD-10-CM | POA: Diagnosis not present

## 2021-04-13 DIAGNOSIS — E669 Obesity, unspecified: Secondary | ICD-10-CM | POA: Diagnosis not present

## 2021-04-15 DIAGNOSIS — L821 Other seborrheic keratosis: Secondary | ICD-10-CM | POA: Diagnosis not present

## 2021-05-07 NOTE — Progress Notes (Deleted)
°  Established patient visit   Patient: Jordan Evans   DOB: [birth year]   71 y.o. Male  MRN: [id number] Visit Date: 05/14/2021  No chief complaint on file.  Subjective    HPI  ***   HTN: Pt denies chest pain, palpitations, dizziness or leg swelling. Taking medication as directed without side effects. Checks BP at home ***times/wk and readings range in ***. Pt follows a low salt diet.  HLD: Pt taking medication as directed without issues. Denies side effects including myalgias and RUQ pain.    Medications: Outpatient Medications Prior to Visit  Medication Sig   amLODipine (NORVASC) 10 MG tablet TAKE 1 TABLET BY MOUTH EVERY DAY   atorvastatin (LIPITOR) 20 MG tablet TAKE 1 TABLET BY MOUTH EVERY DAY   cholecalciferol (VITAMIN D3) 25 MCG (1000 UNIT) tablet Take 1,000 Units by mouth daily.   levothyroxine (SYNTHROID) 75 MCG tablet TAKE 1 TABLET BY MOUTH EVERY DAY BEFORE BREAKFAST   Magnesium 250 MG TABS Take 250 mg by mouth daily.   No facility-administered medications prior to visit.    Review of Systems  {Labs   Heme   Chem   Endocrine   Serology   Results Review (optional):[id number]}   Objective    There were no vitals taken for this visit. BP Readings from Last 3 Encounters:  11/11/20 123/68  07/10/20 132/78  03/11/20 123/69   Wt Readings from Last 3 Encounters:  11/11/20 212 lb 1.6 oz (96.2 kg)  07/10/20 212 lb 4.8 oz (96.3 kg)  03/11/20 213 lb 1.6 oz (96.7 kg)    Physical Exam  ***  No results found for any visits on 05/14/21.  Assessment & Plan     *** Problem List Items Addressed This Visit   None   No follow-ups on file.        Lorrene Reid, PA-C  [facility] [phone] (phone) [phone] (fax)  [place]

## 2021-05-11 DIAGNOSIS — U071 COVID-19: Secondary | ICD-10-CM | POA: Diagnosis not present

## 2021-05-14 ENCOUNTER — Ambulatory Visit: Payer: Medicare Other | Admitting: Physician Assistant

## 2021-05-24 ENCOUNTER — Other Ambulatory Visit: Payer: Self-pay | Admitting: Physician Assistant

## 2021-05-24 DIAGNOSIS — I10 Essential (primary) hypertension: Secondary | ICD-10-CM

## 2021-06-01 DIAGNOSIS — E039 Hypothyroidism, unspecified: Secondary | ICD-10-CM | POA: Diagnosis not present

## 2021-06-18 ENCOUNTER — Other Ambulatory Visit: Payer: Self-pay | Admitting: Physician Assistant

## 2021-06-18 DIAGNOSIS — U071 COVID-19: Secondary | ICD-10-CM | POA: Diagnosis not present

## 2021-06-25 ENCOUNTER — Other Ambulatory Visit: Payer: Self-pay | Admitting: Physician Assistant

## 2021-06-25 DIAGNOSIS — I10 Essential (primary) hypertension: Secondary | ICD-10-CM

## 2021-09-02 DIAGNOSIS — J09X2 Influenza due to identified novel influenza A virus with other respiratory manifestations: Secondary | ICD-10-CM | POA: Diagnosis not present

## 2021-09-05 DIAGNOSIS — J029 Acute pharyngitis, unspecified: Secondary | ICD-10-CM | POA: Diagnosis not present

## 2021-09-05 DIAGNOSIS — J209 Acute bronchitis, unspecified: Secondary | ICD-10-CM | POA: Diagnosis not present

## 2021-10-05 DIAGNOSIS — E039 Hypothyroidism, unspecified: Secondary | ICD-10-CM | POA: Diagnosis not present

## 2021-10-05 DIAGNOSIS — N480 Leukoplakia of penis: Secondary | ICD-10-CM | POA: Diagnosis not present

## 2021-10-05 DIAGNOSIS — Z0000 Encounter for general adult medical examination without abnormal findings: Secondary | ICD-10-CM | POA: Diagnosis not present

## 2021-10-05 DIAGNOSIS — Z1211 Encounter for screening for malignant neoplasm of colon: Secondary | ICD-10-CM | POA: Diagnosis not present

## 2021-10-05 DIAGNOSIS — R918 Other nonspecific abnormal finding of lung field: Secondary | ICD-10-CM | POA: Diagnosis not present

## 2021-10-05 DIAGNOSIS — M6208 Separation of muscle (nontraumatic), other site: Secondary | ICD-10-CM | POA: Diagnosis not present

## 2021-10-05 DIAGNOSIS — M1812 Unilateral primary osteoarthritis of first carpometacarpal joint, left hand: Secondary | ICD-10-CM | POA: Diagnosis not present

## 2021-10-05 DIAGNOSIS — N200 Calculus of kidney: Secondary | ICD-10-CM | POA: Diagnosis not present

## 2021-10-05 DIAGNOSIS — M6530 Trigger finger, unspecified finger: Secondary | ICD-10-CM | POA: Diagnosis not present

## 2021-10-05 DIAGNOSIS — N400 Enlarged prostate without lower urinary tract symptoms: Secondary | ICD-10-CM | POA: Diagnosis not present

## 2021-10-05 DIAGNOSIS — E785 Hyperlipidemia, unspecified: Secondary | ICD-10-CM | POA: Diagnosis not present

## 2021-10-05 DIAGNOSIS — I10 Essential (primary) hypertension: Secondary | ICD-10-CM | POA: Diagnosis not present

## 2021-10-05 DIAGNOSIS — R0602 Shortness of breath: Secondary | ICD-10-CM | POA: Diagnosis not present

## 2021-10-05 DIAGNOSIS — I700 Atherosclerosis of aorta: Secondary | ICD-10-CM | POA: Diagnosis not present

## 2021-10-05 DIAGNOSIS — E669 Obesity, unspecified: Secondary | ICD-10-CM | POA: Diagnosis not present

## 2021-11-01 NOTE — Progress Notes (Unsigned)
Cardiology Office Note:    Date:  11/02/2021   ID:  Toy Cookey, DOB 1950-11-04, MRN [id number]  PCP:  Lorrene Reid, PA-C  Cardiologist:  None   Referring MD: Margretta Sidle, MD   Chief Complaint  Patient presents with   Shortness of Breath   Edema   Hypertension   Hyperlipidemia    History of Present Illness:    Jordan Evans is a 71 y.o. male with a hx of hypertension,  HLD, daytime sleepiness, referred for LE swelling and DOE.  Noting LE swelling x 1 year.  He is accompanied by his wife who is a Insurance account manager.  He says that she encouraged him to come in.  There are multiple complaints.  The wife notes that over the past several years his exertional tolerance is decreased.  He gets short of breath quickly with things that she feels are not that difficult to do.  An example she gave was that on Sunday he wiped down the screens on his house which required that he walk around the house 2 or 3 times, and came inside and was wiped out for the rest of the day.  He felt tired, wanted to sleep, and could not do much else.  The patient relates that he gets short of breath with walking particularly if after eating.  And there can be a subxiphoid discomfort that is poorly characterized which occurs shortly after he stops walking and sits.  It subsides within 3 to 5 minutes.  No associated diaphoresis or radiation.  Lately, they have noted lower extremity swelling in both legs.  He is on amlodipine 10 mg/day but has been on that dose for greater than 10 years.  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  He retired about 5 years ago.  Recently he has been sleeping with his legs elevated because of the swelling.  He denies orthopnea.  He snores, has frequent awakening to urinate, and has excessive daytime sleepiness.  His wife states that while asleep, he occasionally has apnea.  Past Medical History:  Diagnosis Date   History of kidney stones     Hyperlipidemia    Hypertension    Hypothyroidism    Syncope    Wears glasses     Past Surgical History:  Procedure Laterality Date   COLONOSCOPY     x2   CYSTOSCOPY WITH LITHOLAPAXY N/A 05/03/2019   Procedure: CYSTOSCOPY WITH LITHOLAPAXY WITH RIGHT PYELOGRAM;  Surgeon: [name], MD;  Location: [facility];  Service: Urology;  Laterality: N/A;   CYSTOSCOPY WITH URETHRAL DILATATION N/A 05/03/2019   Procedure: CYSTOSCOPY WITH URETHRAL DILATATION;  Surgeon: [name], MD;  Location: [facility];  Service: Urology;  Laterality: N/A;   EXTRACORPOREAL SHOCK WAVE LITHOTRIPSY Right 01/24/2017   Procedure: EXTRACORPOREAL SHOCK WAVE LITHOTRIPSY (ESWL);  Surgeon: Nickie Retort, MD;  Location: [facility];  Service: Urology;  Laterality: Right;   HERNIA REPAIR     umbilical hernia- 2426   LITHOTRIPSY     ROTATOR CUFF REPAIR Right    ROTATOR CUFF REPAIR Left    UPPER GI ENDOSCOPY     URETHRAL DILATION     x5    VASECTOMY      Current Medications: Current Meds  Medication Sig   alfuzosin (UROXATRAL) 10 MG 24 hr tablet Take 10 mg by mouth daily.   amLODipine (NORVASC) 10 MG tablet TAKE 1 TABLET BY MOUTH  EVERY DAY **PLEASE CONTACT OUR OFFICE TO SCHEDULE A FOLLOW UP FOR FUTURE MED REFILLS**   atorvastatin (LIPITOR) 20 MG tablet TAKE 1 TABLET BY MOUTH EVERY DAY   cholecalciferol (VITAMIN D3) 25 MCG (1000 UNIT) tablet Take 1,000 Units by mouth daily.   furosemide (LASIX) 20 MG tablet Take 20 mg by mouth daily as needed.   levothyroxine (SYNTHROID) 88 MCG tablet Take 88 mcg by mouth daily.   Magnesium 250 MG TABS Take 250 mg by mouth daily.   [DISCONTINUED] levothyroxine (SYNTHROID) 75 MCG tablet TAKE 1 TABLET BY MOUTH EVERY DAY BEFORE BREAKFAST     Allergies:   Percocet [oxycodone-acetaminophen] and Tamsulosin   Social History   Socioeconomic History   Marital status: Married    Spouse name: Not on file   Number of children: Not on file   Years of  education: Not on file   Highest education level: Not on file  Occupational History   Not on file  Tobacco Use   Smoking status: Never   Smokeless tobacco: Never  Vaping Use   Vaping Use: Never used  Substance and Sexual Activity   Alcohol use: Yes    Comment: 1 beer weekly   Drug use: Not Currently   Sexual activity: Yes    Birth control/protection: Surgical  Other Topics Concern   Not on file  Social History Narrative   Lives with wife   'some college, works for [employer]   Children 5   Caffeine- 1 soda daily   Social Determinants of Radio broadcast assistant Strain: Not on file  Food Insecurity: Not on file  Transportation Needs: Not on file  Physical Activity: Not on file  Stress: Not on file  Social Connections: Not on file     Family History: The patient's family history includes Cancer in his brother and mother; Diabetes in his paternal grandmother; Heart attack in his father; Hyperlipidemia in his brother, brother, brother, brother, father, mother, and sister; Hypertension in his brother, brother, brother, brother, father, mother, and sister.  ROS:   Please see the history of present illness.    He is not a smoker.  He does have a family history of CAD.  His father and mother both had MIs.  2 uncles had an MI.  A younger sister has had an MI.  He does not drink alcohol on a regular basis.  He does have excessive daytime sleepiness.  Ankles have been swollen for greater than a year.  All other systems reviewed and are negative.  EKGs/Labs/Other Studies Reviewed:    The following studies were reviewed today: No available cardiac functional or imaging data.  EKG:  EKG sinus rhythm at 61 bpm.  Normal EKG.  PR interval 176 ms.  When compared to the tracing done on October 09, 2018, no changes noted.  Recent Labs: 11/11/2020: ALT 18; BUN 13; Creatinine, Ser 1.07; Hemoglobin 16.3; Platelets 241; Potassium 4.4; Sodium 139; TSH 3.010  Recent Lipid Panel     Component Value Date/Time   CHOL 172 11/11/2020 1001   CHOL 183 04/18/2015 0000   TRIG 91 11/11/2020 1001   TRIG 107 04/18/2015 0000   HDL 58 11/11/2020 1001   CHOLHDL 3.0 11/11/2020 1001   LDLCALC 97 11/11/2020 1001    Physical Exam:    VS:  BP 120/80   Pulse 61   Ht '5\' 10"'$  (1.778 m)   Wt 213 lb (96.6 kg)   SpO2 95%   BMI 30.56 kg/m  Wt Readings from Last 3 Encounters:  11/02/21 213 lb (96.6 kg)  11/11/20 212 lb 1.6 oz (96.2 kg)  07/10/20 212 lb 4.8 oz (96.3 kg)     GEN: Overweight. No acute distress HEENT: Normal NECK: No JVD. LYMPHATICS: No lymphadenopathy CARDIAC: No murmur. RRR no gallop, but with 1-2+ pedal and lower extremity edema with pitting.  VASCULAR:  Normal Pulses. No bruits. RESPIRATORY:  Clear to auscultation without rales, wheezing or rhonchi  ABDOMEN: Soft, non-tender, non-distended, No pulsatile mass, MUSCULOSKELETAL: No deformity  SKIN: Warm and dry NEUROLOGIC:  Alert and oriented x 3 PSYCHIATRIC:  Normal affect   ASSESSMENT:    1. DOE (dyspnea on exertion)   2. Lower extremity edema   3. Primary hypertension   4. Hyperlipidemia, unspecified hyperlipidemia type   5. Excessive daytime sleepiness   6. Snoring    PLAN:    In order of problems listed above:  He has peripheral edema without evidence of volume overload with flat JVD.  The lower extremity edema could be related to amlodipine.  Rule out other possible explanations.  Recent laboratory data demonstrated a normal albumin, BUN, and creatinine.  Rule out diastolic heart failure as a cause of exertional dyspnea.  He also has subxiphoid discomfort if he walks very far after eating.  This could be postprandial angina.  Exercise treadmill test will be done. The edema could be related to amlodipine.  If there is pulmonary hypertension related to sleep apnea, this could explain some of the lower extremity swelling.  Echocardiogram will be helpful. Blood pressure control today is  adequate. Continue Lipitor 20 mg/day.  Coronary calcium score. Sleep study is needed to rule out obstructive sleep apnea   To assess the patient, 2D Doppler echocardiography, calcium score, electrocardiographic exercise treadmill test, and sleep study will be performed.   Medication Adjustments/Labs and Tests Ordered: Current medicines are reviewed at length with the patient today.  Concerns regarding medicines are outlined above.  Orders Placed This Encounter  Procedures   CT CARDIAC SCORING   Cardiac Stress Test: Informed Consent Details: Physician/Practitioner Attestation; Transcribe to consent form and obtain patient signature   EXERCISE TOLERANCE TEST (ETT)   EKG 12-Lead   ECHOCARDIOGRAM COMPLETE   Home sleep test   No orders of the defined types were placed in this encounter.   Patient Instructions  Medication Instructions:  Your physician recommends that you continue on your current medications as directed. Please refer to the Current Medication list given to you today.  *If you need a refill on your cardiac medications before your next appointment, please call your pharmacy*  Lab Work: NONE  Testing/Procedures: Your physician has requested that you have an echocardiogram. Echocardiography is a painless test that uses sound waves to create images of your heart. It provides your doctor with information about the size and shape of your heart and how well your heart's chambers and valves are working. This procedure takes approximately one hour. There are no restrictions for this procedure.  Your physician has recommended you have a coronary calcium score performed.  Your physician has recommended you have an exercise tolerance test performed.  Your physician has recommended you have a home sleep study performed.  **You will be contacted by the scheduler to setup appointments for these tests.**  Follow-Up: At [facility], you and your health needs are our priority.   As part of our continuing mission to provide you with exceptional heart care, we have created designated Provider Care Teams.  These Care Teams include your primary Cardiologist (physician) and Advanced Practice Providers (APPs -  Physician Assistants and Nurse Practitioners) who all work together to provide you with the care you need, when you need it.  Your next appointment:   Will be determined based on results of tests.  Other Instructions Instructions for Exercise Tolerance Test   Please arrive 15 minutes prior to your appointment time for registration and insurance purposes.  The test will take approximately 45 minutes to complete.  How to prepare for your Exercise Stress Test: Do bring a list of your current medications with you.  If not listed below, you may take your medications as normal. Do wear comfortable clothes (no dresses or overalls) and walking shoes, tennis shoes preferred (no heels or open toed shoes are allowed) Do Not wear cologne, perfume, aftershave or lotions (deodorant is allowed). Please report to [address], [address] for your test.  If these instructions are not followed, your test will have to be rescheduled.  If you have questions or concerns about your appointment, you can call the Stress Lab at [phone].  If you cannot keep your appointment, please provide 24 hours notification to the Stress Lab, to avoid a possible $50 charge to your account.  Important Information About Sugar         Signed, Sinclair Grooms, MD  11/02/2021 5:25 PM    [facility]

## 2021-11-02 ENCOUNTER — Ambulatory Visit (INDEPENDENT_AMBULATORY_CARE_PROVIDER_SITE_OTHER): Payer: Medicare Other | Admitting: Interventional Cardiology

## 2021-11-02 ENCOUNTER — Encounter: Payer: Self-pay | Admitting: Interventional Cardiology

## 2021-11-02 VITALS — BP 120/80 | HR 61 | Ht 70.0 in | Wt 213.0 lb

## 2021-11-02 DIAGNOSIS — B029 Zoster without complications: Secondary | ICD-10-CM | POA: Diagnosis not present

## 2021-11-02 DIAGNOSIS — I10 Essential (primary) hypertension: Secondary | ICD-10-CM

## 2021-11-02 DIAGNOSIS — R55 Syncope and collapse: Secondary | ICD-10-CM

## 2021-11-02 DIAGNOSIS — R0609 Other forms of dyspnea: Secondary | ICD-10-CM

## 2021-11-02 DIAGNOSIS — E785 Hyperlipidemia, unspecified: Secondary | ICD-10-CM

## 2021-11-02 DIAGNOSIS — R600 Localized edema: Secondary | ICD-10-CM | POA: Diagnosis not present

## 2021-11-02 DIAGNOSIS — G4719 Other hypersomnia: Secondary | ICD-10-CM

## 2021-11-02 DIAGNOSIS — R0683 Snoring: Secondary | ICD-10-CM | POA: Diagnosis not present

## 2021-11-02 NOTE — Patient Instructions (Signed)
Medication Instructions:  Your physician recommends that you continue on your current medications as directed. Please refer to the Current Medication list given to you today.  *If you need a refill on your cardiac medications before your next appointment, please call your pharmacy*  Lab Work: NONE  Testing/Procedures: Your physician has requested that you have an echocardiogram. Echocardiography is a painless test that uses sound waves to create images of your heart. It provides your doctor with information about the size and shape of your heart and how well your heart's chambers and valves are working. This procedure takes approximately one hour. There are no restrictions for this procedure.  Your physician has recommended you have a coronary calcium score performed.  Your physician has recommended you have an exercise tolerance test performed.  Your physician has recommended you have a home sleep study performed.  **You will be contacted by the scheduler to setup appointments for these tests.**  Follow-Up: At [facility], you and your health needs are our priority.  As part of our continuing mission to provide you with exceptional heart care, we have created designated Provider Care Teams.  These Care Teams include your primary Cardiologist (physician) and Advanced Practice Providers (APPs -  Physician Assistants and Nurse Practitioners) who all work together to provide you with the care you need, when you need it.  Your next appointment:   Will be determined based on results of tests.  Other Instructions Instructions for Exercise Tolerance Test   Please arrive 15 minutes prior to your appointment time for registration and insurance purposes.  The test will take approximately 45 minutes to complete.  How to prepare for your Exercise Stress Test: Do bring a list of your current medications with you.  If not listed below, you may take your medications as normal. Do wear  comfortable clothes (no dresses or overalls) and walking shoes, tennis shoes preferred (no heels or open toed shoes are allowed) Do Not wear cologne, perfume, aftershave or lotions (deodorant is allowed). Please report to [address] for your test.  If these instructions are not followed, your test will have to be rescheduled.  If you have questions or concerns about your appointment, you can call the Stress Lab at [phone].  If you cannot keep your appointment, please provide 24 hours notification to the Stress Lab, to avoid a possible $50 charge to your account.  Important Information About Sugar

## 2021-11-12 ENCOUNTER — Telehealth: Payer: Self-pay | Admitting: *Deleted

## 2021-11-12 NOTE — Telephone Encounter (Addendum)
Prior Authorization for HOME SLEEP TEST sent to [facility] via Phone. Reference #  NO PA REQ/ REF# SADE B. 11/12/21.

## 2021-11-18 ENCOUNTER — Ambulatory Visit (HOSPITAL_COMMUNITY): Payer: Medicare Other | Attending: Cardiology

## 2021-11-18 DIAGNOSIS — R0609 Other forms of dyspnea: Secondary | ICD-10-CM | POA: Insufficient documentation

## 2021-11-18 LAB — ECHOCARDIOGRAM COMPLETE
Area-P 1/2: 3.57 cm2
S' Lateral: 2.3 cm

## 2021-11-20 ENCOUNTER — Ambulatory Visit (HOSPITAL_COMMUNITY)
Admission: RE | Admit: 2021-11-20 | Discharge: 2021-11-20 | Disposition: A | Discharge status: Home / Self Care | Payer: Medicare Other | Source: Ambulatory Visit | Attending: Interventional Cardiology | Admitting: Interventional Cardiology

## 2021-11-20 DIAGNOSIS — I10 Essential (primary) hypertension: Secondary | ICD-10-CM | POA: Insufficient documentation

## 2021-11-20 DIAGNOSIS — E785 Hyperlipidemia, unspecified: Secondary | ICD-10-CM | POA: Insufficient documentation

## 2021-11-24 ENCOUNTER — Ambulatory Visit (INDEPENDENT_AMBULATORY_CARE_PROVIDER_SITE_OTHER): Payer: Medicare Other

## 2021-11-24 DIAGNOSIS — I10 Essential (primary) hypertension: Secondary | ICD-10-CM | POA: Diagnosis not present

## 2021-11-24 DIAGNOSIS — E785 Hyperlipidemia, unspecified: Secondary | ICD-10-CM | POA: Diagnosis not present

## 2021-11-24 DIAGNOSIS — R0609 Other forms of dyspnea: Secondary | ICD-10-CM | POA: Diagnosis not present

## 2021-11-24 LAB — EXERCISE TOLERANCE TEST
Angina Index: 0
Duke Treadmill Score: 6
Estimated workload: 7
Exercise duration (min): 6 min
Exercise duration (sec): 0 s
MPHR: 151 {beats}/min
Peak HR: 146 {beats}/min
Percent HR: 96 %
Rest HR: 67 {beats}/min
ST Depression (mm): 0 mm

## 2021-12-17 DIAGNOSIS — N481 Balanitis: Secondary | ICD-10-CM | POA: Diagnosis not present

## 2021-12-21 ENCOUNTER — Ambulatory Visit (HOSPITAL_BASED_OUTPATIENT_CLINIC_OR_DEPARTMENT_OTHER): Payer: Medicare Other | Admitting: Cardiovascular Disease

## 2021-12-21 DIAGNOSIS — G4719 Other hypersomnia: Secondary | ICD-10-CM

## 2021-12-23 ENCOUNTER — Ambulatory Visit (HOSPITAL_BASED_OUTPATIENT_CLINIC_OR_DEPARTMENT_OTHER): Payer: Medicare Other | Attending: Interventional Cardiology | Admitting: Cardiovascular Disease

## 2021-12-23 DIAGNOSIS — G4736 Sleep related hypoventilation in conditions classified elsewhere: Secondary | ICD-10-CM

## 2021-12-23 DIAGNOSIS — G4733 Obstructive sleep apnea (adult) (pediatric): Secondary | ICD-10-CM | POA: Diagnosis not present

## 2021-12-23 DIAGNOSIS — G4719 Other hypersomnia: Secondary | ICD-10-CM | POA: Diagnosis not present

## 2022-01-05 ENCOUNTER — Encounter (HOSPITAL_BASED_OUTPATIENT_CLINIC_OR_DEPARTMENT_OTHER): Payer: Self-pay | Admitting: Cardiovascular Disease

## 2022-01-05 NOTE — Procedures (Signed)
     Patient Name: Jordan Evans, Jordan Evans Date: 12/24/2021 Gender: Male D.O.B: 1950-12-07 Age (years): 70 Referring Provider: Daneen Schick Height (inches): 69 Interpreting Physician: Shelva Majestic MD, ABSM Weight (lbs): 210 RPSGT: Gerhard Perches BMI: 30 MRN: [id number] Neck Size: 17.00  CLINICAL INFORMATION Sleep Study Type: HST  Indication for sleep study: snoring, frequent awakening, daytime sleepiness  Epworth Sleepiness Score: 5  SLEEP STUDY TECHNIQUE A multi-channel overnight portable sleep study was performed. The channels recorded were: nasal airflow, thoracic respiratory movement, and oxygen saturation with a pulse oximetry. Snoring was also monitored.  MEDICATIONS alfuzosin (UROXATRAL) 10 MG 24 hr tablet amLODipine (NORVASC) 10 MG tablet atorvastatin (LIPITOR) 20 MG tablet cholecalciferol (VITAMIN D3) 25 MCG (1000 UNIT) tablet furosemide (LASIX) 20 MG tablet levothyroxine (SYNTHROID) 88 MCG tablet Magnesium 250 MG TABS  Patient self administered medications include: N/A.  SLEEP ARCHITECTURE Patient was studied for 364.5 minutes. The sleep efficiency was 100.0 % and the patient was supine for 0%. The arousal index was 0.0 per hour.  RESPIRATORY PARAMETERS The overall AHI was 49.4 per hour, with a central apnea index of 0 per hour.  The oxygen nadir was 81% during sleep. Time spent < 89% was 22.3 minutes.  CARDIAC DATA Mean heart rate during sleep was 60.3 bpm. Heart rate range 46 - 104 bpm.  IMPRESSIONS - Severe obstructive sleep apnea occurred during this study (AHI  49.4/h). - Moderate oxygen desaturation to a nadir of 81). - Patient snored 43.7 minutes (12%) during the sleep.  DIAGNOSIS - Obstructive Sleep Apnea (G47.33) - Nocturnal Hypoxemia (G47.36)  RECOMMENDATIONS - Therapeutic CPAP for treatment of the patient's severe sleep disordered breathing. If unable to obtain an in-lab titration, initiate Auto-PAP with EPR of 3 at 7 - 18 cm of water.  -  Effort should be made to optimize nasal and oropharyngeal patency.  - Avoid alcohol, sedatives and other CNS depressants that may worsen sleep apnea and disrupt normal sleep architecture. - Sleep hygiene should be reviewed to assess factors that may improve sleep quality. - Weight management (BMI 30) and regular exercise should be initiated or continued. - Recommend a download and sleep clinic evaluation after 4 weeks of therapy.  [Electronically signed] 01/05/2022 05:52 PM  Shelva Majestic MD, [facility], ABSM Diplomate, American Board of Sleep Medicine  NPI: [id number]  [place] PH: [phone]   FX: [phone] [place]

## 2022-01-08 ENCOUNTER — Telehealth: Payer: Self-pay | Admitting: *Deleted

## 2022-01-08 DIAGNOSIS — I700 Atherosclerosis of aorta: Secondary | ICD-10-CM | POA: Diagnosis not present

## 2022-01-08 DIAGNOSIS — R0602 Shortness of breath: Secondary | ICD-10-CM | POA: Diagnosis not present

## 2022-01-08 DIAGNOSIS — N480 Leukoplakia of penis: Secondary | ICD-10-CM | POA: Diagnosis not present

## 2022-01-08 NOTE — Telephone Encounter (Signed)
-----   Message from Troy Sine, MD sent at 01/05/2022  5:56 PM EDT ----- Mariann Laster, please notify patient the results of the sleep study.  Try to arrange for an in lab titration but if unable then initiate AutoPap as prescribed.

## 2022-01-08 NOTE — Telephone Encounter (Signed)
Patient notified of sleep results and MD recommendations. OSA disease and effects it can have on health  if left untreated was discussed with the patient. He is declining treatment at this time. He will "think about it" and call back if he decides to proceed. Both ordering provider and Dr Claiborne Billings  will be notified of patient's decision.

## 2022-01-18 NOTE — Telephone Encounter (Signed)
ok 

## 2022-02-08 DIAGNOSIS — N481 Balanitis: Secondary | ICD-10-CM | POA: Diagnosis not present

## 2022-02-08 DIAGNOSIS — R3912 Poor urinary stream: Secondary | ICD-10-CM | POA: Diagnosis not present

## 2022-02-08 DIAGNOSIS — Z125 Encounter for screening for malignant neoplasm of prostate: Secondary | ICD-10-CM | POA: Diagnosis not present

## 2022-03-22 DIAGNOSIS — L309 Dermatitis, unspecified: Secondary | ICD-10-CM | POA: Diagnosis not present

## 2022-07-20 ENCOUNTER — Emergency Department (HOSPITAL_COMMUNITY): Payer: Medicare Other

## 2022-07-20 ENCOUNTER — Emergency Department (HOSPITAL_COMMUNITY)
Admission: EM | Admit: 2022-07-20 | Discharge: 2022-07-20 | Disposition: A | Discharge status: Home / Self Care | Payer: Medicare Other | Attending: Emergency Medicine | Admitting: Emergency Medicine

## 2022-07-20 ENCOUNTER — Other Ambulatory Visit: Payer: Self-pay

## 2022-07-20 DIAGNOSIS — H539 Unspecified visual disturbance: Secondary | ICD-10-CM | POA: Diagnosis not present

## 2022-07-20 DIAGNOSIS — I10 Essential (primary) hypertension: Secondary | ICD-10-CM | POA: Diagnosis not present

## 2022-07-20 DIAGNOSIS — R55 Syncope and collapse: Secondary | ICD-10-CM | POA: Insufficient documentation

## 2022-07-20 DIAGNOSIS — Z79899 Other long term (current) drug therapy: Secondary | ICD-10-CM | POA: Diagnosis not present

## 2022-07-20 DIAGNOSIS — R270 Ataxia, unspecified: Secondary | ICD-10-CM | POA: Diagnosis not present

## 2022-07-20 LAB — URINALYSIS, ROUTINE W REFLEX MICROSCOPIC
Bilirubin Urine: NEGATIVE
Glucose, UA: NEGATIVE mg/dL
Hgb urine dipstick: NEGATIVE
Ketones, ur: NEGATIVE mg/dL
Leukocytes,Ua: NEGATIVE
Nitrite: NEGATIVE
Protein, ur: NEGATIVE mg/dL
Specific Gravity, Urine: 1.027 (ref 1.005–1.030)
pH: 5 (ref 5.0–8.0)

## 2022-07-20 LAB — ETHANOL: Alcohol, Ethyl (B): <10 mg/dL (ref <10)

## 2022-07-20 LAB — BASIC METABOLIC PANEL
Anion gap: 11 (ref 5–15)
BUN: 17 mg/dL (ref 8–23)
CO2: 22 mmol/L (ref 22–32)
Calcium: 8.9 mg/dL (ref 8.9–10.3)
Chloride: 107 mmol/L (ref 98–111)
Creatinine, Ser: 1.06 mg/dL (ref 0.61–1.24)
GFR, Estimated: >60 mL/min (ref >60)
Glucose, Bld: 103 mg/dL — ABNORMAL HIGH (ref 70–99)
Potassium: 3.6 mmol/L (ref 3.5–5.1)
Sodium: 140 mmol/L (ref 135–145)

## 2022-07-20 LAB — CBC
HCT: 47.7 % (ref 39.0–52.0)
Hemoglobin: 15.7 g/dL (ref 13.0–17.0)
MCH: 28.9 pg (ref 26.0–34.0)
MCHC: 32.9 g/dL (ref 30.0–36.0)
MCV: 87.7 fL (ref 80.0–100.0)
Platelets: 242 10*3/uL (ref 150–400)
RBC: 5.44 MIL/uL (ref 4.22–5.81)
RDW: 12.7 % (ref 11.5–15.5)
WBC: 6.5 10*3/uL (ref 4.0–10.5)
nRBC: 0 % (ref 0.0–0.2)

## 2022-07-20 LAB — RAPID URINE DRUG SCREEN, HOSP PERFORMED
Amphetamines: NOT DETECTED
Barbiturates: NOT DETECTED
Benzodiazepines: NOT DETECTED
Cocaine: NOT DETECTED
Opiates: NOT DETECTED
Tetrahydrocannabinol: NOT DETECTED

## 2022-07-20 LAB — CBG MONITORING, ED: Glucose-Capillary: 105 mg/dL — ABNORMAL HIGH (ref 70–99)

## 2022-07-20 LAB — PROTIME-INR
INR: 1.1 (ref 0.8–1.2)
Prothrombin Time: 13.6 seconds (ref 11.4–15.2)

## 2022-07-20 LAB — APTT: aPTT: 30 seconds (ref 24–36)

## 2022-07-20 MED ORDER — IOHEXOL 350 MG/ML SOLN
75.0000 mL | Freq: Once | INTRAVENOUS | Status: AC | PRN
  Administered 2022-07-20: 75 mL via INTRAVENOUS

## 2022-07-20 MED ORDER — FENTANYL CITRATE PF 50 MCG/ML IJ SOSY
25.0000 ug | PREFILLED_SYRINGE | Freq: Once | INTRAMUSCULAR | Status: AC
  Administered 2022-07-20: 25 ug via INTRAVENOUS
  Filled 2022-07-20: qty 1

## 2022-07-20 MED ORDER — LORAZEPAM 2 MG/ML IJ SOLN
1.0000 mg | Freq: Once | INTRAMUSCULAR | Status: AC
  Administered 2022-07-20: 1 mg via INTRAVENOUS
  Filled 2022-07-20: qty 1
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
## 2022-07-20 NOTE — ED Notes (Signed)
MD ordered to give fentanyl and ativan for MRI. MRI states they will call when ready for RN to give med.

## 2022-07-20 NOTE — ED Notes (Signed)
Pt medicated as ordered prior to MRI. Patient transported to MRI
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
## 2022-07-20 NOTE — ED Notes (Signed)
Pt back from MRI 

## 2022-07-20 NOTE — ED Provider Notes (Signed)
[place] EMERGENCY DEPARTMENT AT [facility] Provider Note   CSN: [id number] Arrival date & time: 07/20/22  1510     History  Chief Complaint  Patient presents with   Near Syncope    Jordan Evans is a 72 y.o. male.  HPI    72 year old male comes in with chief complaint of loss of consciousness, balance issues and binocular tunnel vision. Patient has history of hypertension, hyperlipidemia.  He states that over the last 2 weeks he has been routinely noticing that he has been unsteady with his gait, usually favoring the left side.  This afternoon after having lunch with his wife, he was walking back to the car.  He was unsteady with his walker again, and even choked that people like he think he was drunk.  Subsequently he got in the car and started driving.  As he was driving on the highway, he had sudden episode of unusual feeling in the back of his head that moved forward, followed by tunneling of his vision in both eyes.  He verbalized this change to his wife, who took over controls of the steering.  She asked the patient to let go several times, but patient does not recall that information.  Wife was ultimately able to help patient pull over.  Patient's tunnel vision persisted for at least 5 more minutes.  Thereafter when he got out of the car to walk to the passenger side, he was constantly stumbling onto the car.  Wife drove patient straight to the hospital.  Even when he arrived here, he was unsteady with his gait.  Patient now feels fine with his gait and also vision.  At no point did he have vertiginous symptoms, neck pain or severe headache.  There is no history of stroke, CAD.   Home Medications Prior to Admission medications   Medication Sig Start Date End Date Taking? Authorizing Provider  alfuzosin (UROXATRAL) 10 MG 24 hr tablet Take 10 mg by mouth daily. 10/05/21   [provider]  amLODipine (NORVASC) 10 MG tablet TAKE 1 TABLET BY MOUTH EVERY DAY **PLEASE  CONTACT OUR OFFICE TO SCHEDULE A FOLLOW UP FOR FUTURE MED REFILLS** 06/18/21   Abonza, Maritza, PA-C  atorvastatin (LIPITOR) 20 MG tablet TAKE 1 TABLET BY MOUTH EVERY DAY 04/13/21   Mayer Masker, PA-C  cholecalciferol (VITAMIN D3) 25 MCG (1000 UNIT) tablet Take 1,000 Units by mouth daily.    [provider]  furosemide (LASIX) 20 MG tablet Take 20 mg by mouth daily as needed. 10/27/21   [provider]  levothyroxine (SYNTHROID) 88 MCG tablet Take 88 mcg by mouth daily. 08/29/21   [provider]  Magnesium 250 MG TABS Take 250 mg by mouth daily.    [provider]      Allergies    Percocet [oxycodone-acetaminophen] and Tamsulosin    Review of Systems   Review of Systems  All other systems reviewed and are negative.   Physical Exam Updated Vital Signs BP (!) 123/91   Pulse 63   Temp 98.1 F (36.7 C) (Oral)   Resp 16   SpO2 99%  Physical Exam Vitals and nursing note reviewed.  Constitutional:      Appearance: He is well-developed.  HENT:     Head: Atraumatic.  Eyes:     Extraocular Movements: Extraocular movements intact.     Pupils: Pupils are equal, round, and reactive to light.     Comments: Peripheral visual fields are intact  Cardiovascular:     Rate and Rhythm: Normal rate.  Pulmonary:     Effort: Pulmonary effort is normal.  Musculoskeletal:     Cervical back: Neck supple.  Skin:    General: Skin is warm.  Neurological:     Mental Status: He is alert and oriented to person, place, and time.     Cranial Nerves: No cranial nerve deficit.     Sensory: No sensory deficit.     Motor: No weakness.     Coordination: Coordination normal.     ED Results / Procedures / Treatments   Labs (all labs ordered are listed, but only abnormal results are displayed) Labs Reviewed  BASIC METABOLIC PANEL - Abnormal; Notable for the following components:      Result Value   Glucose, Bld 103 (*)    All other components within normal limits   URINALYSIS, ROUTINE W REFLEX MICROSCOPIC - Abnormal; Notable for the following components:   Color, Urine AMBER (*)    APPearance HAZY (*)    Bacteria, UA RARE (*)    All other components within normal limits  CBG MONITORING, ED - Abnormal; Notable for the following components:   Glucose-Capillary 105 (*)    All other components within normal limits  CBC  ETHANOL  PROTIME-INR  APTT  RAPID URINE DRUG SCREEN, HOSP PERFORMED    EKG EKG Interpretation  Date/Time:  Tuesday July 20 2022 15:34:51 EDT Ventricular Rate:  64 PR Interval:  166 QRS Duration: 90 QT Interval:  378 QTC Calculation: 389 R Axis:   -31 Text Interpretation: Normal sinus rhythm Left axis deviation Cannot rule out Anterior infarct , age undetermined Abnormal ECG When compared with ECG of 27-Aug-2017 17:49, PREVIOUS ECG IS PRESENT No acute changes Confirmed by Derwood Kaplan [phone]) on 07/20/2022 5:09:06 PM  Radiology MR BRAIN WO CONTRAST  Result Date: 07/20/2022 CLINICAL DATA:  Ataxia EXAM: MRI HEAD WITHOUT CONTRAST TECHNIQUE: Multiplanar, multiecho pulse sequences of the brain and surrounding structures were obtained without intravenous contrast. COMPARISON:  Head CT 07/20/2022. FINDINGS: Brain: No acute infarct, mass effect or extra-axial collection. No acute or chronic hemorrhage. Normal white matter signal, parenchymal volume and CSF spaces. The midline structures are normal. Vascular: Major flow voids are preserved. Skull and upper cervical spine: Normal calvarium and skull base. Visualized upper cervical spine and soft tissues are normal. Sinuses/Orbits:No paranasal sinus fluid levels or advanced mucosal thickening. No mastoid or middle ear effusion. Normal orbits. IMPRESSION: Normal brain MRI. Electronically Signed   By: Deatra Robinson M.D.   On: 07/20/2022 20:24   CT ANGIO HEAD NECK W WO CM  Result Date: 07/20/2022 CLINICAL DATA:  TIA, vision loss, ataxia EXAM: CT ANGIOGRAPHY HEAD AND NECK WITH AND WITHOUT  CONTRAST TECHNIQUE: Multidetector CT imaging of the head and neck was performed using the standard protocol during bolus administration of intravenous contrast. Multiplanar CT image reconstructions and MIPs were obtained to evaluate the vascular anatomy. Carotid stenosis measurements (when applicable) are obtained utilizing NASCET criteria, using the distal internal carotid diameter as the denominator. RADIATION DOSE REDUCTION: This exam was performed according to the departmental dose-optimization program which includes automated exposure control, adjustment of the mA and/or kV according to patient size and/or use of iterative reconstruction technique. CONTRAST:  75mL OMNIPAQUE IOHEXOL 350 MG/ML SOLN COMPARISON:  No prior CTA available, correlation is made with 07/20/2022 CT head FINDINGS: CT HEAD FINDINGS For noncontrast findings, please see same day CT head. CTA NECK FINDINGS Aortic arch: Standard branching. Imaged portion  shows no evidence of aneurysm or dissection. No significant stenosis of the major arch vessel origins. Right carotid system: No evidence of stenosis, dissection, or occlusion. Left carotid system: No evidence of stenosis, dissection, or occlusion. Vertebral arteries: No evidence of stenosis, dissection, or occlusion. Skeleton: No acute osseous abnormality. Degenerative changes in the cervical spine. Other neck: Negative. Upper chest: No focal pulmonary opacity or pleural effusion. Calcification in the left upper lobe, likely sequela prior granulomatous disease. Review of the MIP images confirms the above findings CTA HEAD FINDINGS Anterior circulation: Both internal carotid arteries are patent to the termini, without significant stenosis. 2 mm posterolaterally directed outpouching from right ICA near the terminus (series 5, image 127 and series 8, image 100), likely a tiny aneurysm. A1 segments patent. Normal anterior communicating artery. Anterior cerebral arteries are patent to their distal  aspects without significant stenosis. No M1 stenosis or occlusion. MCA branches perfused to their distal aspects without significant stenosis. Posterior circulation: Vertebral arteries patent to the vertebrobasilar junction without significant stenosis. Posterior inferior cerebellar arteries patent proximally. Basilar patent to its distal aspect without significant stenosis. Superior cerebellar arteries patent proximally. Patent P1 segments. PCAs perfused to their distal aspects without significant stenosis. The bilateral posterior communicating arteries are not visualized. Venous sinuses: As permitted by contrast timing, patent. Anatomic variants: None significant. Review of the MIP images confirms the above findings IMPRESSION: 1. No intracranial large vessel occlusion or significant stenosis. 2. No hemodynamically significant stenosis in the neck. 3. 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 Electronically Signed   By: Wiliam Ke M.D.   On: 07/20/2022 19:10   CT HEAD WO CONTRAST  Result Date: 07/20/2022 CLINICAL DATA:  Syncope/presyncope, cerebrovascular cause suspected EXAM: CT HEAD WITHOUT CONTRAST TECHNIQUE: Contiguous axial images were obtained from the base of the skull through the vertex without intravenous contrast. RADIATION DOSE REDUCTION: This exam was performed according to the departmental dose-optimization program which includes automated exposure control, adjustment of the mA and/or kV according to patient size and/or use of iterative reconstruction technique. COMPARISON:  Head CT 03/09/2017 FINDINGS: Brain: Pints of acute infarction. No hemorrhage, hydrocephalus or midline shift. There is a 13 mm densely calcified structure abutting the anterior superior falx that is stable from prior exam, either a coarse dural calcification or a calcified meningioma. Regardless this is unchanged, without mass effect. Again seen remote lacunar  infarct versus prominent perivascular space in the low left basal ganglia. Vascular: No hyperdense vessel or unexpected calcification. Skull: No fracture or focal lesion. Sinuses/Orbits: Mild diffuse paranasal sinus mucosal thickening. No mastoid effusion. Unremarkable appearance of the orbits. Other: None. IMPRESSION: 1. No acute intracranial abnormality. 2. Unchanged 13 mm densely calcified structure abutting the anterior superior falx, either a coarse dural calcification or a calcified meningioma. Regardless, this is stable from 2018 without mass effect, considered benign. Electronically Signed   By: Narda Rutherford M.D.   On: 07/20/2022 16:34    Procedures Procedures    Medications Ordered in ED Medications  iohexol (OMNIPAQUE) 350 MG/ML injection 75 mL (75 mLs Intravenous Contrast Given 07/20/22 1831)  LORazepam (ATIVAN) injection 1 mg (1 mg Intravenous Given 07/20/22 1932)  fentaNYL (SUBLIMAZE) injection 25 mcg (25 mcg Intravenous Given 07/20/22 1933)    ED Course/ Medical Decision Making/ A&P       ABCD2 Score: 1  Medical Decision Making Amount and/or Complexity of Data Reviewed Labs: ordered. Radiology: ordered.  Risk Prescription drug management.   This patient presents to the ED with chief complaint(s) of ataxic gait, tunneling of vision and likely syncopal episode with pertinent past medical history of hypertension, hyperlipidemia.The complaint involves an extensive differential diagnosis and also carries with it a high risk of complications and morbidity.    The differential diagnosis includes : TIA, acute ischemic stroke, partial seizure, hypertension, syncope, arrhythmia, severe electrolyte abnormality, brain tumor.  The initial plan is to get basic labs, CT angiogram head and neck.   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  Independent labs  interpretation:  The following labs were independently interpreted: CBC, metabolic profile and urine analysis is normal  Independent visualization and interpretation of imaging: - I independently visualized the following imaging with scope of interpretation limited to determining acute life threatening conditions related to emergency care: CT scan of the brain, which revealed no evidence of brain bleed.   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  MRI pending.  We have given patient Ativan and fentanyl.  If MRI negative patient will be discharged for outpatient neuro follow-up.  Consultation: - Consulted or discussed management/test interpretation with external professional: Case discussed with Dr. Otelia Limes, neurology.  He is comfortable with patient getting outpatient management if MRI is negative.  Consideration for admission or further workup: Admission was considered.  However patient has no known coronary artery disease.  His telemetry monitoring has been reassuring.  I have independently interpreted patient's telemetry monitoring strips.  He has not had any arrhythmias.  Additionally he has an MRI that is negative for stroke.  Will start him on aspirin and advised outpatient follow-up.  The patient appears reasonably screened and/or stabilized for discharge and I doubt any other medical condition or other [facility] requiring further screening, evaluation, or treatment in the ED at this time prior to discharge.   Results from the ER workup discussed with the patient face to face and all questions answered to the best of my ability. The patient is safe for discharge with strict return precautions.   Final Clinical Impression(s) / ED Diagnoses Final diagnoses:  Near syncope  Visual disturbance  Ataxia    Rx / DC Orders ED Discharge Orders          Ordered    Ambulatory referral to Neurology       Comments: An appointment is requested in  approximately: 2 weeks   07/20/22 2115              Derwood Kaplan, MD 07/20/22 2134

## 2022-07-20 NOTE — ED Notes (Signed)
Pt transported to CT ?

## 2022-07-20 NOTE — Discharge Instructions (Addendum)
We recommend that you follow-up with neurology and your primary care doctor in the outpatient setting.  The workup in the emergency room is negative for stroke.  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  Have neurology discussed that finding with you as well.  Your primary care doctor might have to send you to neurosurgery for monitoring the aneurysm.  Start taking baby aspirin daily.  [place] law prevents people with seizures or fainting from driving or operating dangerous machinery until they are free of seizures or fainting for 6 months.

## 2022-08-10 ENCOUNTER — Ambulatory Visit (INDEPENDENT_AMBULATORY_CARE_PROVIDER_SITE_OTHER): Payer: Medicare Other | Admitting: Diagnostic Neuroimaging

## 2022-08-10 ENCOUNTER — Encounter: Payer: Self-pay | Admitting: Diagnostic Neuroimaging

## 2022-08-10 VITALS — BP 132/74 | HR 61 | Ht 70.0 in | Wt 212.0 lb

## 2022-08-10 DIAGNOSIS — R55 Syncope and collapse: Secondary | ICD-10-CM | POA: Diagnosis not present

## 2022-08-10 NOTE — Progress Notes (Signed)
[facility]  PATIENT: Jordan Evans DOB: Apr 07, 1950  REFERRING CLINICIAN: Derwood Kaplan, MD  HISTORY FROM: patient  REASON FOR VISIT: new consult    HISTORICAL  CHIEF COMPLAINT:  Chief Complaint  Patient presents with   New Patient (Initial Visit)    Patient in room #6 with his wife. Patient wife states he has balance issues and has past out a few time.    HISTORY OF PRESENT ILLNESS:   UPDATE (08/10/22, VRP): Since last visit, doing WELL until April 2024. 07/20/22, was driving car, then felt lightheaded, dizzy, then asked wife to grab steering wheel. Then he lost vision, and briefly passed out (few seconds). Went to ER for evaluation. Some other intermittent lightheadedness, nausea, weakness spells.  PRIOR HPI (03/21/17): 72 year old right-handed male here for evaluation of syncope.  03/09/17 patient was at home, getting ready to leave home when he felt abnormal sensation from his knees down, legs felt funny and then he passed out.  He was passed off for approximately 1 minute.  Patient went to the emergency room for evaluation.  Patient's wife mentioned that she was concerned that patient was acting differently, getting up in the middle the night to go to the bathroom and then getting lost.  They recommended neurology follow-up for dementia evaluation.  03/12/17 patient was at home, felt spinning, nausea, vision changes, slurred speech, kidney and flank pain and passed out for a few seconds.  Patient went to the emergency room for evaluation and was found to have kidney stones.  He was set up for outpatient urology follow-up.  Also with at least one year of gradual onset progressive short-term memory loss and confusion spells according to wife.   REVIEW OF SYSTEMS: Full 14 system review of systems performed and negative with exception of: as per HPI.   ALLERGIES: Allergies  Allergen Reactions   Percocet [Oxycodone-Acetaminophen]     Leg weakness    Tamsulosin     Dropped blood pressure, near syncope    HOME MEDICATIONS: Outpatient Medications Prior to Visit  Medication Sig Dispense Refill   alfuzosin (UROXATRAL) 10 MG 24 hr tablet Take 10 mg by mouth daily.     amLODipine (NORVASC) 10 MG tablet TAKE 1 TABLET BY MOUTH EVERY DAY **PLEASE CONTACT OUR OFFICE TO SCHEDULE A FOLLOW UP FOR FUTURE MED REFILLS** 15 tablet 0   atorvastatin (LIPITOR) 20 MG tablet TAKE 1 TABLET BY MOUTH EVERY DAY 90 tablet 1   cholecalciferol (VITAMIN D3) 25 MCG (1000 UNIT) tablet Take 1,000 Units by mouth daily.     levothyroxine (SYNTHROID) 88 MCG tablet Take 88 mcg by mouth daily.     Magnesium 250 MG TABS Take 250 mg by mouth daily.     furosemide (LASIX) 20 MG tablet Take 20 mg by mouth daily as needed. (Patient not taking: Reported on 08/10/2022)     No facility-administered medications prior to visit.    PAST MEDICAL HISTORY: Past Medical History:  Diagnosis Date   History of kidney stones    Hyperlipidemia    Hypertension    Hypothyroidism    Syncope    Wears glasses     PAST SURGICAL HISTORY: Past Surgical History:  Procedure Laterality Date   COLONOSCOPY     x2   CYSTOSCOPY WITH LITHOLAPAXY N/A 05/03/2019   Procedure: CYSTOSCOPY WITH LITHOLAPAXY WITH RIGHT PYELOGRAM;  Surgeon: Bjorn Pippin, MD;  Location: [facility];  Service: Urology;  Laterality: N/A;   CYSTOSCOPY WITH URETHRAL DILATATION N/A  05/03/2019   Procedure: CYSTOSCOPY WITH URETHRAL DILATATION;  Surgeon: Bjorn Pippin, MD;  Location: [facility];  Service: Urology;  Laterality: N/A;   EXTRACORPOREAL SHOCK WAVE LITHOTRIPSY Right 01/24/2017   Procedure: EXTRACORPOREAL SHOCK WAVE LITHOTRIPSY (ESWL);  Surgeon: Hildred Laser, MD;  Location: [facility];  Service: Urology;  Laterality: Right;   HERNIA REPAIR     umbilical hernia- 1998   LITHOTRIPSY     ROTATOR CUFF REPAIR Right    ROTATOR CUFF REPAIR Left    UPPER GI ENDOSCOPY     URETHRAL DILATION     x5     VASECTOMY      FAMILY HISTORY: Family History  Problem Relation Age of Onset   Cancer Mother        ovarian   Hyperlipidemia Mother    Hypertension Mother    Heart attack Father    Hyperlipidemia Father    Hypertension Father    Hyperlipidemia Sister    Hypertension Sister    Cancer Brother        lymph   Hyperlipidemia Brother    Hypertension Brother    Diabetes Paternal Grandmother    Hyperlipidemia Brother    Hypertension Brother    Hyperlipidemia Brother    Hypertension Brother    Hyperlipidemia Brother    Hypertension Brother     SOCIAL HISTORY:  Social History   Socioeconomic History   Marital status: Married    Spouse name: Not on file   Number of children: Not on file   Years of education: Not on file   Highest education level: Not on file  Occupational History   Not on file  Tobacco Use   Smoking status: Never   Smokeless tobacco: Never  Vaping Use   Vaping Use: Never used  Substance and Sexual Activity   Alcohol use: Yes    Comment: 1 beer weekly   Drug use: Not Currently   Sexual activity: Yes    Birth control/protection: Surgical  Other Topics Concern   Not on file  Social History Narrative   Lives with wife   's[employer] college, works for [employer]   Children 5   Caffeine- 1 soda daily   Social Determinants of Corporate investment banker Strain: Not on file  Food Insecurity: Not on file  Transportation Needs: Not on file  Physical Activity: Not on file  Stress: Not on file  Social Connections: Not on file  Intimate Partner Violence: Not on file     PHYSICAL EXAM  GENERAL EXAM/CONSTITUTIONAL: Vitals:  Vitals:   08/10/22 1032  BP: 132/74  Pulse: 61  Weight: 212 lb (96.2 kg)  Height: 5\' 10"  (1.778 m)  No data found.  Body mass index is 30.42 kg/m. No results found.  Patient is in no distress; well developed, nourished and groomed; neck is supple  CARDIOVASCULAR: Examination of carotid arteries is normal; no  carotid bruits Regular rate and rhythm, no murmurs Examination of peripheral vascular system by observation and palpation is normal  EYES: Ophthalmoscopic exam of optic discs and posterior segments is normal; no papilledema or hemorrhages  MUSCULOSKELETAL: Gait, strength, tone, movements noted in Neurologic exam below  NEUROLOGIC: MENTAL STATUS:      No data to display         awake, alert, oriented to person, place and time recent and remote memory intact normal attention and concentration language fluent, comprehension intact, naming intact,  fund of knowledge appropriate  CRANIAL NERVE:  2nd -  no papilledema on fundoscopic exam 2nd, 3rd, 4th, 6th - pupils equal and reactive to light, visual fields full to confrontation, extraocular muscles intact, no nystagmus 5th - facial sensation symmetric 7th - facial strength symmetric 8th - hearing intact 9th - palate elevates symmetrically, uvula midline 11th - shoulder shrug symmetric 12th - tongue protrusion midline  MOTOR:  normal bulk and tone, full strength in the BUE, BLE  SENSORY:  normal and symmetric to light touch, temperature, vibration  COORDINATION:  finger-nose-finger, fine finger movements normal  REFLEXES:  deep tendon reflexes TRACE and symmetric  GAIT/STATION:  narrow based gait; ABLE TO STAND AND WALK    DIAGNOSTIC DATA (LABS, IMAGING, TESTING) - I reviewed patient records, labs, notes, testing and imaging myself where available.  Lab Results  Component Value Date   WBC 6.5 07/20/2022   HGB 15.7 07/20/2022   HCT 47.7 07/20/2022   MCV 87.7 07/20/2022   PLT 242 07/20/2022      Component Value Date/Time   NA 140 07/20/2022 1600   NA 139 11/11/2020 1001   NA 140 04/18/2015 0000   K 3.6 07/20/2022 1600   K 4.5 04/18/2015 0000   CL 107 07/20/2022 1600   CO2 22 07/20/2022 1600   GLUCOSE 103 (H) 07/20/2022 1600   BUN 17 07/20/2022 1600   BUN 13 11/11/2020 1001   CREATININE 1.06 07/20/2022  1600   CREATININE 0.95 04/18/2015 0000   CALCIUM 8.9 07/20/2022 1600   PROT 7.2 11/11/2020 1001   ALBUMIN 4.7 11/11/2020 1001   AST 19 11/11/2020 1001   AST 23 04/18/2015 0000   ALT 18 11/11/2020 1001   ALT 19 04/18/2015 0000   ALKPHOS 112 11/11/2020 1001   ALKPHOS 95 04/18/2015 0000   BILITOT 0.8 11/11/2020 1001   GFRNONAA >60 07/20/2022 1600   GFRAA 83 11/06/2019 1015   Lab Results  Component Value Date   CHOL 172 11/11/2020   HDL 58 11/11/2020   LDLCALC 97 11/11/2020   TRIG 91 11/11/2020   CHOLHDL 3.0 11/11/2020   Lab Results  Component Value Date   HGBA1C 5.3 04/11/2019   No results found for: "VITAMINB12" Lab Results  Component Value Date   TSH 3.010 11/11/2020    03/09/17 CT head [I reviewed images myself and agree with interpretation. -VRP]  - Small right posterior scalp hematoma. No acute intracranial abnormality seen.  07/20/22 Normal brain MRI.   07/20/22 CTA head / neck 1. No intracranial large vessel occlusion or significant stenosis. 2. No hemodynamically significant stenosis in the neck. 3. 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    ASSESSMENT AND PLAN  72 y.o. year old male here with recurrent, unprovoked syncope.   Ddx: syncope (vasovagal vs cardiogenic vs vertebro-basilar insufficiency)   1. Syncope, unspecified syncope type      PLAN:  RECURRENT SYNCOPE (unprovoked? events in 2018 and 2024)  - follow up with cardiology; consider cardiac monitoring  - severe OSA noted in Sept 2023 --> recommend CPAP!  - According to [place] law, you can not drive unless you are syncope free for at least 6 months and under physician's care.   - Please maintain precautions. Do not participate in activities where a loss of awareness could harm you or someone else. No swimming alone, no tub bathing, no hot tubs, no driving, no operating motorized vehicles (cars, ATVs, motocycles, etc), lawnmowers,  power tools or firearms. No standing at heights, such as rooftops,  ladders or stairs. Avoid hot objects such as stoves, heaters, open fires. Wear a helmet when riding a bicycle, scooter, skateboard, etc. and avoid areas of traffic. Set your water heater to 120 degrees or less.  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  I reviewed images, labs, notes, records myself. I summarized findings and reviewed with patient, for this high risk condition (recurrent syncope) requiring high complexity decision making.    Suanne Marker, MD 08/10/2022, 11:33 AM Certified in Neurology, Neurophysiology and Neuroimaging  [facility] [address] [place], [place] [address] [phone]

## 2022-08-10 NOTE — Patient Instructions (Addendum)
RECURRENT SYNCOPE (unprovoked? Events in 2018 and 2024)  - follow up with cardiology; consider cardiac monitoring  - severe OSA noted in Sept 2023 --> recommend CPAP!  - According to [place] law, you can not drive unless you are syncope free for at least 6 months and under physician's care.   - Please maintain precautions. Do not participate in activities where a loss of awareness could harm you or someone else. No swimming alone, no tub bathing, no hot tubs, no driving, no operating motorized vehicles (cars, ATVs, motocycles, etc), lawnmowers, power tools or firearms. No standing at heights, such as rooftops, ladders or stairs. Avoid hot objects such as stoves, heaters, open fires. Wear a helmet when riding a bicycle, scooter, skateboard, etc. and avoid areas of traffic. Set your water heater to 120 degrees or less.  INTERMITTENT LIGHTHEADEDNESS, DIZZINESS, NAUSEA, SLURRED SPEECH - could be related to pre-syncope issues; follow up with PCP and cardiology

## 2022-08-13 ENCOUNTER — Ambulatory Visit (INDEPENDENT_AMBULATORY_CARE_PROVIDER_SITE_OTHER): Payer: Medicare Other

## 2022-08-13 ENCOUNTER — Ambulatory Visit: Payer: Medicare Other | Attending: Cardiology | Admitting: Cardiology

## 2022-08-13 ENCOUNTER — Encounter: Payer: Self-pay | Admitting: Cardiology

## 2022-08-13 VITALS — BP 129/73 | HR 66 | Ht 70.0 in | Wt 212.0 lb

## 2022-08-13 DIAGNOSIS — G4733 Obstructive sleep apnea (adult) (pediatric): Secondary | ICD-10-CM | POA: Diagnosis not present

## 2022-08-13 DIAGNOSIS — R55 Syncope and collapse: Secondary | ICD-10-CM | POA: Insufficient documentation

## 2022-08-13 NOTE — Progress Notes (Unsigned)
Applied a 14 day Zio AT monitor to patient in the office 

## 2022-08-13 NOTE — Progress Notes (Signed)
Cardiology Office Note:    Date:  08/13/2022   ID:  Jordan Evans, DOB 09/14/1950, MRN [id number]  PCP:  Jordan Shove, DO  Cardiologist:  None   Referring Evans: Jordan Evans, *   No chief complaint on file.   History of Present Illness:    Jordan Evans is a 72 y.o. male here for evaluation of syncope at the request of Dr. Marjory Evans.  Former patient of Dr. Sherilyn Cooter Evans's.  He has diagnosed severe sleep apnea from September 2023.  Coronary calcium score was 0 echo was normal ETT normal.  Family history of MI and multiple male family members at age 72.    Hx of hypertension,  HLD, daytime sleepiness  Here for follow-up of syncope.  On 07/20/2022 was driving his car felt lightheaded dizzy asked wife to grab the steering wheel then he lost his vision and briefly passed out for few seconds.  Went to the emergency department for evaluation.  Has had intermittent lightheadedness nausea and weakness.  Back in 2018 he was evaluated for syncope as well.  In review of prior neurology notes he felt abnormal sensation from his knees down legs felt funny he passed out for about a minute.  Nausea.  He has had intermittent leg discomfort.  His dorsalis pedis pulses/posterior tibial pulses are excellent.  Wife who is a Estate agent.  He retired about 5 years ago.  Recently he has been sleeping with his legs elevated because of the swelling.  He denies orthopnea.    Past Medical History:  Diagnosis Date   History of kidney stones    Hyperlipidemia    Hypertension    Hypothyroidism    Syncope    Wears glasses     Past Surgical History:  Procedure Laterality Date   COLONOSCOPY     x2   CYSTOSCOPY WITH LITHOLAPAXY N/A 05/03/2019   Procedure: CYSTOSCOPY WITH LITHOLAPAXY WITH RIGHT PYELOGRAM;  Surgeon: Jordan Evans;  Location: [facility];  Service: Urology;  Laterality: N/A;   CYSTOSCOPY WITH URETHRAL DILATATION N/A 05/03/2019   Procedure: CYSTOSCOPY WITH  URETHRAL DILATATION;  Surgeon: Jordan Evans;  Location: [facility];  Service: Urology;  Laterality: N/A;   EXTRACORPOREAL SHOCK WAVE LITHOTRIPSY Right 01/24/2017   Procedure: EXTRACORPOREAL SHOCK WAVE LITHOTRIPSY (ESWL);  Surgeon: Jordan Evans;  Location: [facility];  Service: Urology;  Laterality: Right;   HERNIA REPAIR     umbilical hernia- 1998   LITHOTRIPSY     ROTATOR CUFF REPAIR Right    ROTATOR CUFF REPAIR Left    UPPER GI ENDOSCOPY     URETHRAL DILATION     x5    VASECTOMY      Current Medications: Current Meds  Medication Sig   alfuzosin (UROXATRAL) 10 MG 24 hr tablet Take 10 mg by mouth daily.   amLODipine (NORVASC) 10 MG tablet TAKE 1 TABLET BY MOUTH EVERY DAY **PLEASE CONTACT OUR OFFICE TO SCHEDULE A FOLLOW UP FOR FUTURE MED REFILLS**   atorvastatin (LIPITOR) 20 MG tablet TAKE 1 TABLET BY MOUTH EVERY DAY   cholecalciferol (VITAMIN D3) 25 MCG (1000 UNIT) tablet Take 1,000 Units by mouth daily.   levothyroxine (SYNTHROID) 88 MCG tablet Take 88 mcg by mouth daily.   Magnesium 250 MG TABS Take 250 mg by mouth daily.     Allergies:   Percocet [oxycodone-acetaminophen] and Tamsulosin   Social History   Socioeconomic History   Marital status: Married  Spouse name: Not on file   Number of children: Not on file   Years of education: Not on file   Highest education level: Not on file  Occupational History   Not on file  Tobacco Use   Smoking status: Never   Smokeless tobacco: Never  Vaping Use   Vaping Use: Never used  Substance and Sexual Activity   Alcohol use: Yes    Comment: 1 beer weekly   Drug use: Not Currently   Sexual activity: Yes    Birth control/protection: Surgical  Other Topics Concern   Not on file  Social History Narrative   Lives with wife   's[employer], works for [employer]   Children 5   Caffeine- 1 soda daily   Social Determinants of Corporate investment banker Strain: Not on file  Food Insecurity:  Not on file  Transportation Needs: Not on file  Physical Activity: Not on file  Stress: Not on file  Social Connections: Not on file     Family History: The patient's family history includes Cancer in his brother and mother; Diabetes in his paternal grandmother; Heart attack in his father; Hyperlipidemia in his brother, brother, brother, brother, father, mother, and sister; Hypertension in his brother, brother, brother, brother, father, mother, and sister.  ROS:   Please see the history of present illness.    He is not a smoker.  He does have a family history of CAD.  His father and mother both had MIs.  2 uncles had an MI.  A younger sister has had an MI.  He does not drink alcohol on a regular basis.  He does have excessive daytime sleepiness. All other systems reviewed and are negative.  EKGs/Labs/Other Studies Reviewed:    The following studies were reviewed today: Cardiac Studies & Procedures     STRESS TESTS  EXERCISE TOLERANCE TEST (ETT) 11/30/2021  Narrative   No ST deviation was noted.  ETT with fair exercise tolerance (6:00); no chest pain; hypertensive blood pressure response; no ST changes; negative adequate exercise treadmill.   ECHOCARDIOGRAM  ECHOCARDIOGRAM COMPLETE 11/18/2021  Narrative ECHOCARDIOGRAM REPORT    Patient Name:   Jordan Evans Date of Exam: 11/18/2021 Medical Rec #:  [id number]     Height:       70.0 in Accession #:    [id number]    Weight:       213.0 lb Date of Birth:  Feb 21, 1951    BSA:          2.144 m Patient Age:    70 years      BP:           120/80 mmHg Patient Gender: M             HR:           61 bpm. Exam Location:  [address]  Procedure: 3D Echo, 2D Echo, Cardiac Doppler, Color Doppler and Strain Analysis  Indications:    R06.09 Dyspnea  History:        Patient has no prior history of Echocardiogram examinations. Signs/Symptoms:Dyspnea, Chest Pain, Shortness of Breath, Syncope, Edema and Dizziness/Lightheadedness;  Risk Factors:Hypertension, Dyslipidemia and Family History of Coronary Artery Disease.  Sonographer:    Jordan Evans RDCS Referring Phys: Jordan Evans [facility]  IMPRESSIONS   1. Left ventricular ejection fraction, by estimation, is 60 to 65%. The left ventricle has normal function. The left ventricle has no regional wall motion abnormalities. Left ventricular diastolic parameters are  consistent with Grade I diastolic dysfunction (impaired relaxation). The average left ventricular global longitudinal strain is -24.9 %. The global longitudinal strain is normal. 2. Right ventricular systolic function is normal. The right ventricular size is normal. There is normal pulmonary artery systolic pressure. The estimated right ventricular systolic pressure is 21.5 mmHg. 3. Left atrial size was mildly dilated. 4. The mitral valve is normal in structure. No evidence of mitral valve regurgitation. No evidence of mitral stenosis. 5. The aortic valve is tricuspid. There is mild calcification of the aortic valve. Aortic valve regurgitation is not visualized. No aortic stenosis is present. 6. The inferior vena cava is normal in size with greater than 50% respiratory variability, suggesting right atrial pressure of 3 mmHg.  FINDINGS Left Ventricle: Left ventricular ejection fraction, by estimation, is 60 to 65%. The left ventricle has normal function. The left ventricle has no regional wall motion abnormalities. The average left ventricular global longitudinal strain is -24.9 %. The global longitudinal strain is normal. The left ventricular internal cavity size was normal in size. There is no left ventricular hypertrophy. Left ventricular diastolic parameters are consistent with Grade I diastolic dysfunction (impaired relaxation).  Right Ventricle: The right ventricular size is normal. No increase in right ventricular wall thickness. Right ventricular systolic function is normal. There is normal pulmonary artery  systolic pressure. The tricuspid regurgitant velocity is 2.15 m/s, and with an assumed right atrial pressure of 3 mmHg, the estimated right ventricular systolic pressure is 21.5 mmHg.  Left Atrium: Left atrial size was mildly dilated.  Right Atrium: Right atrial size was normal in size.  Pericardium: There is no evidence of pericardial effusion.  Mitral Valve: The mitral valve is normal in structure. No evidence of mitral valve regurgitation. No evidence of mitral valve stenosis.  Tricuspid Valve: The tricuspid valve is normal in structure. Tricuspid valve regurgitation is trivial.  Aortic Valve: The aortic valve is tricuspid. There is mild calcification of the aortic valve. Aortic valve regurgitation is not visualized. No aortic stenosis is present.  Pulmonic Valve: The pulmonic valve was normal in structure. Pulmonic valve regurgitation is not visualized.  Aorta: The aortic root is normal in size and structure.  Venous: The inferior vena cava is normal in size with greater than 50% respiratory variability, suggesting right atrial pressure of 3 mmHg.  IAS/Shunts: No atrial level shunt detected by color flow Doppler.   LEFT VENTRICLE PLAX 2D LVIDd:         4.60 cm   Diastology LVIDs:         2.30 cm   LV e' medial:    11.30 cm/s LV PW:         0.80 cm   LV E/e' medial:  7.5 LV IVS:        1.00 cm   LV e' lateral:   13.70 cm/s LVOT diam:     2.30 cm   LV E/e' lateral: 6.2 LV SV:         87 LV SV Index:   41        2D Longitudinal Strain LVOT Area:     4.15 cm  2D Strain GLS (A2C):   -24.9 % 2D Strain GLS (A3C):   -24.4 % 2D Strain GLS (A4C):   -25.2 % 2D Strain GLS Avg:     -24.9 %  3D Volume EF: 3D EF:        63 % LV EDV:       107 ml LV ESV:  39 ml LV SV:        68 ml  RIGHT VENTRICLE RV Basal diam:  4.20 cm RV Mid diam:    3.20 cm RV S prime:     14.50 cm/s TAPSE (M-mode): 2.1 cm  LEFT ATRIUM             Index        RIGHT ATRIUM           Index LA diam:         4.60 cm 2.15 cm/m   RA Area:     17.90 cm LA Vol (A2C):   59.4 ml 27.71 ml/m  RA Volume:   48.70 ml  22.72 ml/m LA Vol (A4C):   66.5 ml 31.02 ml/m LA Biplane Vol: 66.8 ml 31.16 ml/m AORTIC VALVE LVOT Vmax:   94.10 cm/s LVOT Vmean:  61.050 cm/s LVOT VTI:    0.210 m  AORTA Ao Root diam: 2.80 cm Ao Asc diam:  3.05 cm  MITRAL VALVE               TRICUSPID VALVE MV Area (PHT): cm         TR Peak grad:   18.5 mmHg MV Decel Time: 212 msec    TR Vmax:        215.00 cm/s MV E velocity: 85.07 cm/s MV A velocity: 97.03 cm/s  SHUNTS MV E/A ratio:  0.88        Systemic VTI:  0.21 m Systemic Diam: 2.30 cm  Dalton McleanMD Electronically signed by Wilfred Lacy Signature Date/Time: 11/18/2021/2:59:40 PM    Final     CT SCANS  CT CARDIAC SCORING (SELF PAY ONLY) 11/20/2021  Addendum 11/20/2021  3:54 PM ADDENDUM REPORT: 11/20/2021 15:52  EXAM: OVER-READ INTERPRETATION  CT CHEST  The following report is an over-read performed by radiologist Dr. Kela Millin [facility] on 11/20/2021. This over-read does not include interpretation of cardiac or coronary anatomy or pathology. The coronary calcium score interpretation by the cardiologist is attached.  COMPARISON:  None.  FINDINGS: Vascular: There are no significant non-cardiac vascular findings.  Mediastinum/Nodes: There are no enlarged lymph nodes.The visualized esophagus demonstrates no significant findings.  Lungs/Pleura: Minimal subsegmental atelectasis/scarring at both lung bases. No focal consolidation, pleural effusion, or pneumothorax.  Upper abdomen: No acute abnormality.  Musculoskeletal/Chest wall: No chest wall abnormality. No acute or significant osseous findings.  IMPRESSION: 1. No significant extracardiac findings.   Electronically Signed By: Obie Dredge M.D. On: 11/20/2021 15:52  Narrative CLINICAL DATA:  Risk stratification  EXAM: Coronary Calcium Score  TECHNIQUE: The  patient was scanned on a Siemens Somatom 64 slice scanner. Axial non-contrast 3mm slices were carried out through the heart. The data set was analyzed on a dedicated work station and scored using the Agatson method.  FINDINGS: Non-cardiac: No significant non cardiac findings on limited lung and soft tissue windows. See separate report from [facility].  Ascending Aorta: Normal diameter  3.3 cm  Pericardium: Normal  Coronary arteries: No calcium noted  IMPRESSION: Coronary calcium score of 0.  Charlton Haws  Electronically Signed: By: Charlton Haws M.D. On: 11/20/2021 13:51           EKG:  EKG sinus rhythm at 61 bpm.  Normal EKG.  PR interval 176 ms.  When compared to the tracing done on October 09, 2018, no changes noted.  Recent Labs: 07/20/2022: BUN 17; Creatinine, Ser 1.06; Hemoglobin 15.7; Platelets 242; Potassium 3.6; Sodium 140  Recent Lipid Panel    Component Value Date/Time   CHOL 172 11/11/2020 1001   CHOL 183 04/18/2015 0000   TRIG 91 11/11/2020 1001   TRIG 107 04/18/2015 0000   HDL 58 11/11/2020 1001   CHOLHDL 3.0 11/11/2020 1001   LDLCALC 97 11/11/2020 1001    Physical Exam:    VS:  BP 129/73 (BP Location: Left Arm, Patient Position: Sitting, Cuff Size: Normal)   Pulse 66   Ht 5\' 10"  (1.778 m)   Wt 212 lb (96.2 kg)   BMI 30.42 kg/m     Wt Readings from Last 3 Encounters:  08/13/22 212 lb (96.2 kg)  08/10/22 212 lb (96.2 kg)  12/23/21 210 lb (95.3 kg)     GEN: Well nourished, well developed, in no acute distress HEENT: normal Neck: no JVD, carotid bruits, or masses Cardiac: RRR; no murmurs, rubs, or gallops,no edema  Respiratory:  clear to auscultation bilaterally, normal work of breathing GI: soft, nontender, nondistended, + BS MS: no deformity or atrophy Skin: warm and dry, no rash Neuro:  Alert and Oriented x 3, Strength and sensation are intact Psych: euthymic mood, full affect   ASSESSMENT:    1. Syncope and collapse   2. OSA  (obstructive sleep apnea)     PLAN:    In order of problems listed above:  Syncope - Has been seen by neurology on 08/10/2022.  Recommended follow-up with [place] with cardiac monitoring.  We will go ahead and set him up with a live ZIO monitor.  No driving for 6 months. Echocardiogram normal.  It is possible that he had a vagal type reaction.  Expressed importance of recognizing prodrome and sitting down/laying down.   Obstructive sleep apnea - Severe noted on September 2023 recommended CPAP.  We will help him get reestablished to sleep clinic.  I expressed the importance of this.  His wife was present on the phone.  Daughter present here as well.  Family history of CAD - Multiple males in his family did not reach 34.  Dr. Katrinka Blazing did a coronary calcium score which was 0.  Exercise treadmill test which was normal and echocardiogram as well in August 2023 which was normal.  Hyperlipidemia - Takes atorvastatin 20 mg a day.     Medication Adjustments/Labs and Tests Ordered: Current medicines are reviewed at length with the patient today.  Concerns regarding medicines are outlined above.  Orders Placed This Encounter  Procedures   LONG TERM MONITOR-LIVE TELEMETRY (3-14 DAYS)   No orders of the defined types were placed in this encounter.   Patient Instructions  Medication Instructions:  The current medical regimen is effective;  continue present plan and medications.  *If you need a refill on your cardiac medications before your next appointment, please call your pharmacy*  Testing/Procedures: ZIO AT Long term monitor-Live Telemetry  Your physician has requested you wear a ZIO patch monitor for 14 days.  This is a single patch monitor. [employer] supplies one patch monitor per enrollment. Additional  stickers are not available.  Please do not apply patch if you will be having a Nuclear Stress Test, Echocardiogram, Cardiac CT, MRI,  or Chest Xray during the period you would be wearing  the monitor. The patch cannot be worn during  these tests. You cannot remove and re-apply the ZIO AT patch monitor.  Your ZIO patch monitor will be mailed 3 day USPS to your address on file. It may take 3-5 days to  receive your monitor  after you have been enrolled.  Once you have received your monitor, please review the enclosed instructions. Your monitor has  already been registered assigning a specific monitor serial # to you.   Billing and Patient Assistance Program information  Meredeth Ide has been supplied with any insurance information on record for billing. [employer] offers a sliding scale Patient Assistance Program for patients without insurance, or whose  insurance does not completely cover the cost of the ZIO patch monitor. You must apply for the  Patient Assistance Program to qualify for the discounted rate. To apply, call [employer] at [phone],  select option 4, select option 2 , ask to apply for the Patient Assistance Program, (you can request an  interpreter if needed). [employer] will ask your household income and how many people are in your  household. [employer] will quote your out-of-pocket cost based on this information. They will also be able  to set up a 12 month interest free payment plan if needed.  Applying the monitor   Shave hair from upper left chest.  Hold the abrader disc by orange tab. Rub the abrader in 40 strokes over left upper chest as indicated in  your monitor instructions.  Clean area with 4 enclosed alcohol pads. Use all pads to ensure the area is cleaned thoroughly. Let  dry.  Apply patch as indicated in monitor instructions. Patch will be placed under collarbone on left side of  chest with arrow pointing upward.  Rub patch adhesive wings for 2 minutes. Remove the white label marked "1". Remove the white label  marked "2". Rub patch adhesive wings for 2 additional minutes.  While looking in a mirror, press and release button in center of patch. A small green  light will flash 3-4  times. This will be your only indicator that the monitor has been turned on.  Do not shower for the first 24 hours. You may shower after the first 24 hours.  Press the button if you feel a symptom. You will hear a small click. Record Date, Time and Symptom in  the Patient Log.   Starting the Gateway  In your kit there is a Audiological scientist box the size of a cellphone. This is Buyer, retail. It transmits all your  recorded data to [facility]. This box must always stay within 10 feet of you. Open the box and push the *  button. There will be a light that blinks orange and then green a few times. When the light stops  blinking, the Gateway is connected to the ZIO patch. Call [employer] at [phone] to confirm your monitor is transmitting.  Returning your monitor  Remove your patch and place it inside the Gateway. In the lower half of the Gateway there is a white  bag with prepaid postage on it. Place Gateway in bag and seal. Mail package back to [place] as soon as  possible. Your physician should have your final report approximately 7 days after you have mailed back  your monitor. Call [facility] Customer Care at [phone] if you have questions regarding your ZIO AT  patch monitor. Call them immediately if you see an orange light blinking on your monitor.  If your monitor falls off in less than 4 days, contact our Monitor department at [phone]. If your  monitor becomes loose or falls off after 4 days call [employer] at [phone] for suggestions on  securing your monitor  You will be contacted about CPAP - Brandie Rorie  Follow-Up: At [facility], you and your health needs are our priority.  As part of our continuing mission to provide you with exceptional heart care, we have created designated Provider Care Teams.  These Care Teams include your primary Cardiologist (physician) and Advanced Practice Providers (APPs -  Physician Assistants and  Nurse Practitioners) who all work together to provide you with the care you need, when you need it.  We recommend signing up for the patient portal called "MyChart".  Sign up information is provided on this After Visit Summary.  MyChart is used to connect with patients for Virtual Visits (Telemedicine).  Patients are able to view lab/test results, encounter notes, upcoming appointments, etc.  Non-urgent messages can be sent to your provider as well.   To learn more about what you can do with MyChart, go to [email or web address].    Your next appointment:   6 month(s)  Provider:   Jari Favre, PA-C, Robin Searing, NP, Jacolyn Reedy, PA-C, Eligha Bridegroom, NP, or Tereso Newcomer, PA-C            Signed, Donato Schultz, Evans  08/13/2022 11:31 AM    [facility]

## 2022-08-13 NOTE — Patient Instructions (Signed)
Medication Instructions:  The current medical regimen is effective;  continue present plan and medications.  *If you need a refill on your cardiac medications before your next appointment, please call your pharmacy*  Testing/Procedures: ZIO AT Long term monitor-Live Telemetry  Your physician has requested you wear a ZIO patch monitor for 14 days.  This is a single patch monitor. [employer] supplies one patch monitor per enrollment. Additional  stickers are not available.  Please do not apply patch if you will be having a Nuclear Stress Test, Echocardiogram, Cardiac CT, MRI,  or Chest Xray during the period you would be wearing the monitor. The patch cannot be worn during  these tests. You cannot remove and re-apply the ZIO AT patch monitor.  Your ZIO patch monitor will be mailed 3 day USPS to your address on file. It may take 3-5 days to  receive your monitor after you have been enrolled.  Once you have received your monitor, please review the enclosed instructions. Your monitor has  already been registered assigning a specific monitor serial # to you.   Billing and Patient Assistance Program information  Meredeth Ide has been supplied with any insurance information on record for billing. [employer] offers a sliding scale Patient Assistance Program for patients without insurance, or whose  insurance does not completely cover the cost of the ZIO patch monitor. You must apply for the  Patient Assistance Program to qualify for the discounted rate. To apply, call [employer] at [phone],  select option 4, select option 2 , ask to apply for the Patient Assistance Program, (you can request an  interpreter if needed). [employer] will ask your household income and how many people are in your  household. [employer] will quote your out-of-pocket cost based on this information. They will also be able  to set up a 12 month interest free payment plan if needed.  Applying the monitor   Shave hair from upper left  chest.  Hold the abrader disc by orange tab. Rub the abrader in 40 strokes over left upper chest as indicated in  your monitor instructions.  Clean area with 4 enclosed alcohol pads. Use all pads to ensure the area is cleaned thoroughly. Let  dry.  Apply patch as indicated in monitor instructions. Patch will be placed under collarbone on left side of  chest with arrow pointing upward.  Rub patch adhesive wings for 2 minutes. Remove the white label marked "1". Remove the white label  marked "2". Rub patch adhesive wings for 2 additional minutes.  While looking in a mirror, press and release button in center of patch. A small green light will flash 3-4  times. This will be your only indicator that the monitor has been turned on.  Do not shower for the first 24 hours. You may shower after the first 24 hours.  Press the button if you feel a symptom. You will hear a small click. Record Date, Time and Symptom in  the Patient Log.   Starting the Gateway  In your kit there is a Audiological scientist box the size of a cellphone. This is Buyer, retail. It transmits all your  recorded data to [facility]. This box must always stay within 10 feet of you. Open the box and push the *  button. There will be a light that blinks orange and then green a few times. When the light stops  blinking, the Gateway is connected to the ZIO patch. Call [employer] at [phone] to confirm your monitor is transmitting.  Returning your monitor  Remove your patch and place it inside the Gateway. In the lower half of the Gateway there is a white  bag with prepaid postage on it. Place Gateway in bag and seal. Mail package back to [place] as soon as  possible. Your physician should have your final report approximately 7 days after you have mailed back  your monitor. Call [facility] Customer Care at [phone] if you have questions regarding your ZIO AT  patch monitor. Call them immediately if you see an orange light  blinking on your monitor.  If your monitor falls off in less than 4 days, contact our Monitor department at [phone]. If your  monitor becomes loose or falls off after 4 days call [employer] at [phone] for suggestions on  securing your monitor  You will be contacted about CPAP - Brandie Rorie  Follow-Up: At [facility], you and your health needs are our priority.  As part of our continuing mission to provide you with exceptional heart care, we have created designated Provider Care Teams.  These Care Teams include your primary Cardiologist (physician) and Advanced Practice Providers (APPs -  Physician Assistants and Nurse Practitioners) who all work together to provide you with the care you need, when you need it.  We recommend signing up for the patient portal called "MyChart".  Sign up information is provided on this After Visit Summary.  MyChart is used to connect with patients for Virtual Visits (Telemedicine).  Patients are able to view lab/test results, encounter notes, upcoming appointments, etc.  Non-urgent messages can be sent to your provider as well.   To learn more about what you can do with MyChart, go to [email or web address].    Your next appointment:   6 month(s)  Provider:   Jari Favre, PA-C, Robin Searing, NP, Jacolyn Reedy, PA-C, Eligha Bridegroom, NP, or Tereso Newcomer, PA-C

## 2022-09-06 LAB — LAB REPORT - SCANNED: EGFR: 87

## 2022-12-05 ENCOUNTER — Other Ambulatory Visit: Payer: Self-pay

## 2022-12-05 ENCOUNTER — Emergency Department (HOSPITAL_COMMUNITY)
Admission: EM | Admit: 2022-12-05 | Discharge: 2022-12-06 | Disposition: A | Discharge status: Home / Self Care | Payer: Medicare Other | Attending: Emergency Medicine | Admitting: Emergency Medicine

## 2022-12-05 ENCOUNTER — Encounter (HOSPITAL_COMMUNITY): Payer: Self-pay | Admitting: Emergency Medicine

## 2022-12-05 ENCOUNTER — Emergency Department (HOSPITAL_COMMUNITY): Payer: Medicare Other

## 2022-12-05 DIAGNOSIS — I10 Essential (primary) hypertension: Secondary | ICD-10-CM | POA: Diagnosis not present

## 2022-12-05 DIAGNOSIS — Z79899 Other long term (current) drug therapy: Secondary | ICD-10-CM | POA: Insufficient documentation

## 2022-12-05 DIAGNOSIS — R531 Weakness: Secondary | ICD-10-CM | POA: Diagnosis not present

## 2022-12-05 DIAGNOSIS — R0789 Other chest pain: Secondary | ICD-10-CM | POA: Diagnosis not present

## 2022-12-05 DIAGNOSIS — R55 Syncope and collapse: Secondary | ICD-10-CM | POA: Insufficient documentation

## 2022-12-05 DIAGNOSIS — R079 Chest pain, unspecified: Secondary | ICD-10-CM

## 2022-12-05 HISTORY — DX: Dizziness and giddiness: R42

## 2022-12-05 LAB — TROPONIN I (HIGH SENSITIVITY): Troponin I (High Sensitivity): 5 ng/L (ref <18)

## 2022-12-05 LAB — CBC
HCT: 42.4 % (ref 39.0–52.0)
Hemoglobin: 14.4 g/dL (ref 13.0–17.0)
MCH: 29.8 pg (ref 26.0–34.0)
MCHC: 34 g/dL (ref 30.0–36.0)
MCV: 87.6 fL (ref 80.0–100.0)
Platelets: 222 10*3/uL (ref 150–400)
RBC: 4.84 MIL/uL (ref 4.22–5.81)
RDW: 12.5 % (ref 11.5–15.5)
WBC: 6.3 10*3/uL (ref 4.0–10.5)
nRBC: 0 % (ref 0.0–0.2)

## 2022-12-05 LAB — BASIC METABOLIC PANEL
Anion gap: 12 (ref 5–15)
BUN: 14 mg/dL (ref 8–23)
CO2: 22 mmol/L (ref 22–32)
Calcium: 8.4 mg/dL — ABNORMAL LOW (ref 8.9–10.3)
Chloride: 103 mmol/L (ref 98–111)
Creatinine, Ser: 1.17 mg/dL (ref 0.61–1.24)
GFR, Estimated: >60 mL/min (ref >60)
Glucose, Bld: 105 mg/dL — ABNORMAL HIGH (ref 70–99)
Potassium: 3.6 mmol/L (ref 3.5–5.1)
Sodium: 137 mmol/L (ref 135–145)

## 2022-12-05 LAB — CBG MONITORING, ED: Glucose-Capillary: 97 mg/dL (ref 70–99)

## 2022-12-05 NOTE — Discharge Instructions (Signed)
You are seen in the emergency department for an episode of near fainting at home followed with some headache and chest pain.  You had blood work EKG chest x-ray and were placed on a cardiac monitor.  There is no evidence of heart attack and we did not see any abnormal rhythms.  Your lab work was unremarkable.  Please stay well-hydrated and continue regular medications.  Follow-up with your primary care doctor.  Return to the emergency department if any worsening or concerning symptoms

## 2022-12-05 NOTE — ED Triage Notes (Signed)
Pt BIB [facility] for weakness and near syncopal episode today while standing and washing dishes, pt endorses chest heaviness, no cardiac history, + vertigo hx, EKG WNL per EMS, per spouse BP was high following incident ,upon EMS arrival 144/78, HR 68, 100% RA, RR17, CBG 93,

## 2022-12-05 NOTE — ED Provider Notes (Signed)
[place] EMERGENCY DEPARTMENT AT [facility] Provider Note   CSN: [id number] Arrival date & time: 12/05/22  2054     History {Add pertinent medical, surgical, social history, OB history to HPI:1} Chief Complaint  Patient presents with   Weakness    Jordan Evans is a 72 y.o. male.  He has a history of hypertension.  He had a fairly active day with exercising on his bike and playing with his grandchild.  After dinner he was wiping down the countertop when he acutely became lightheaded like he might pass out.  Wife was able to help him to a sitting position.  He said it was associated with some tightness in his chest and headache.  Symptoms lasted about 45 minutes.  He said he feels mostly back to baseline now, just a little tired.  He feels he has been well-hydrated and has not had any vomiting or diarrhea.  No cough or fever.  He has had syncopal events in the past and has had a workup that did not show an obvious explanation.  He said he had a Holter monitor  The history is provided by the patient.  Near Syncope This is a recurrent problem. The current episode started 1 to 2 hours ago. The problem has been resolved. Associated symptoms include chest pain and headaches. Pertinent negatives include no abdominal pain and no shortness of breath. Nothing aggravates the symptoms. Nothing relieves the symptoms. He has tried rest for the symptoms. The treatment provided moderate relief.       Home Medications Prior to Admission medications   Medication Sig Start Date End Date Taking? Authorizing Provider  alfuzosin (UROXATRAL) 10 MG 24 hr tablet Take 10 mg by mouth daily. 10/05/21   [provider]  amLODipine (NORVASC) 10 MG tablet TAKE 1 TABLET BY MOUTH EVERY DAY **PLEASE CONTACT OUR OFFICE TO SCHEDULE A FOLLOW UP FOR FUTURE MED REFILLS** 06/18/21   Abonza, Maritza, PA-C  atorvastatin (LIPITOR) 20 MG tablet TAKE 1 TABLET BY MOUTH EVERY DAY 04/13/21   Mayer Masker, PA-C   cholecalciferol (VITAMIN D3) 25 MCG (1000 UNIT) tablet Take 1,000 Units by mouth daily.    [provider]  levothyroxine (SYNTHROID) 88 MCG tablet Take 88 mcg by mouth daily. 08/29/21   [provider]  Magnesium 250 MG TABS Take 250 mg by mouth daily.    [provider]      Allergies    Percocet [oxycodone-acetaminophen] and Tamsulosin    Review of Systems   Review of Systems  Constitutional:  Negative for fever.  Eyes:  Negative for visual disturbance.  Respiratory:  Negative for shortness of breath.   Cardiovascular:  Positive for chest pain and near-syncope.  Gastrointestinal:  Negative for abdominal pain.  Genitourinary:  Negative for dysuria.  Neurological:  Positive for light-headedness and headaches.    Physical Exam Updated Vital Signs BP (!) 153/74   Pulse 61   Temp 98.2 F (36.8 C) (Oral)   Resp 14   Ht 5\' 10"  (1.778 m)   Wt 95.3 kg   SpO2 99%   BMI 30.13 kg/m  Physical Exam Vitals and nursing note reviewed.  Constitutional:      General: He is not in acute distress.    Appearance: Normal appearance. He is well-developed.  HENT:     Head: Normocephalic and atraumatic.  Eyes:     Conjunctiva/sclera: Conjunctivae normal.  Cardiovascular:     Rate and Rhythm: Normal rate and regular rhythm.  Heart sounds: No murmur heard. Pulmonary:     Effort: Pulmonary effort is normal. No respiratory distress.     Breath sounds: Normal breath sounds.  Abdominal:     Palpations: Abdomen is soft.     Tenderness: There is no abdominal tenderness. There is no guarding or rebound.  Musculoskeletal:        General: Normal range of motion.     Cervical back: Neck supple.     Right lower leg: No edema.     Left lower leg: No edema.  Skin:    General: Skin is warm and dry.     Capillary Refill: Capillary refill takes less than 2 seconds.  Neurological:     General: No focal deficit present.     Mental Status: He is alert and oriented to  person, place, and time.     Cranial Nerves: No cranial nerve deficit.     Motor: No weakness.     ED Results / Procedures / Treatments   Labs (all labs ordered are listed, but only abnormal results are displayed) Labs Reviewed  CBC  BASIC METABOLIC PANEL  URINALYSIS, ROUTINE W REFLEX MICROSCOPIC  CBG MONITORING, ED    EKG EKG Interpretation Date/Time:  Sunday December 05 2022 21:10:41 EDT Ventricular Rate:  64 PR Interval:  176 QRS Duration:  80 QT Interval:  372 QTC Calculation: 383 R Axis:   67  Text Interpretation: Normal sinus rhythm Normal ECG When compared with ECG of 20-Jul-2022 15:34, No significant change since last tracing Confirmed by Meridee Score [phone]) on 12/05/2022 9:41:20 PM  Radiology No results found.  Procedures Procedures  {Document cardiac monitor, telemetry assessment procedure when appropriate:1}  Medications Ordered in ED Medications - No data to display  ED Course/ Medical Decision Making/ A&P   {   Click here for ABCD2, HEART and other calculatorsREFRESH Note before signing :1}                              Medical Decision Making Amount and/or Complexity of Data Reviewed Labs: ordered. Radiology: ordered.   This patient complains of ***; this involves an extensive number of treatment Options and is a complaint that carries with it a high risk of complications and morbidity. The differential includes ***  I ordered, reviewed and interpreted labs, which included *** I ordered medication *** and reviewed PMP when indicated. I ordered imaging studies which included *** and I independently    visualized and interpreted imaging which showed *** Additional history obtained from *** Previous records obtained and reviewed *** I consulted *** and discussed lab and imaging findings and discussed disposition.  Cardiac monitoring reviewed, *** Social determinants considered, *** Critical Interventions: ***  After the interventions stated  above, I reevaluated the patient and found *** Admission and further testing considered, ***   {Document critical care time when appropriate:1} {Document review of labs and clinical decision tools ie heart score, Chads2Vasc2 etc:1}  {Document your independent review of radiology images, and any outside records:1} {Document your discussion with family members, caretakers, and with consultants:1} {Document social determinants of health affecting pt's care:1} {Document your decision making why or why not admission, treatments were needed:1} Final Clinical Impression(s) / ED Diagnoses Final diagnoses:  None    Rx / DC Orders ED Discharge Orders     None

## 2022-12-06 DIAGNOSIS — R55 Syncope and collapse: Secondary | ICD-10-CM | POA: Diagnosis not present

## 2022-12-06 LAB — URINALYSIS, ROUTINE W REFLEX MICROSCOPIC
Bilirubin Urine: NEGATIVE
Glucose, UA: NEGATIVE mg/dL
Hgb urine dipstick: NEGATIVE
Ketones, ur: NEGATIVE mg/dL
Leukocytes,Ua: NEGATIVE
Nitrite: NEGATIVE
Protein, ur: NEGATIVE mg/dL
Specific Gravity, Urine: 1.019 (ref 1.005–1.030)
pH: 5 (ref 5.0–8.0)

## 2022-12-06 LAB — TROPONIN I (HIGH SENSITIVITY): Troponin I (High Sensitivity): 4 ng/L (ref <18)
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
## 2023-02-09 ENCOUNTER — Encounter: Payer: Self-pay | Admitting: Physician Assistant

## 2023-02-09 ENCOUNTER — Ambulatory Visit: Payer: Medicare Other | Attending: Physician Assistant | Admitting: Physician Assistant

## 2023-02-09 VITALS — BP 124/68 | HR 69 | Ht 70.0 in | Wt 214.0 lb

## 2023-02-09 DIAGNOSIS — E785 Hyperlipidemia, unspecified: Secondary | ICD-10-CM | POA: Diagnosis present

## 2023-02-09 DIAGNOSIS — I10 Essential (primary) hypertension: Secondary | ICD-10-CM | POA: Diagnosis present

## 2023-02-09 DIAGNOSIS — G4733 Obstructive sleep apnea (adult) (pediatric): Secondary | ICD-10-CM

## 2023-02-09 DIAGNOSIS — R55 Syncope and collapse: Secondary | ICD-10-CM | POA: Diagnosis present

## 2023-02-09 DIAGNOSIS — G4719 Other hypersomnia: Secondary | ICD-10-CM

## 2023-02-09 NOTE — Patient Instructions (Signed)
Medication Instructions:  Your physician recommends that you continue on your current medications as directed. Please refer to the Current Medication list given to you today.  *If you need a refill on your cardiac medications before your next appointment, please call your pharmacy*   Lab Work: None ordered  If you have labs (blood work) drawn today and your tests are completely normal, you will receive your results only by: MyChart Message (if you have MyChart) OR A paper copy in the mail If you have any lab test that is abnormal or we need to change your treatment, we will call you to review the results.   Testing/Procedures: None ordered   Follow-Up: At [facility], you and your health needs are our priority.  As part of our continuing mission to provide you with exceptional heart care, we have created designated Provider Care Teams.  These Care Teams include your primary Cardiologist (physician) and Advanced Practice Providers (APPs -  Physician Assistants and Nurse Practitioners) who all work together to provide you with the care you need, when you need it.  We recommend signing up for the patient portal called "MyChart".  Sign up information is provided on this After Visit Summary.  MyChart is used to connect with patients for Virtual Visits (Telemedicine).  Patients are able to view lab/test results, encounter notes, upcoming appointments, etc.  Non-urgent messages can be sent to your provider as well.   To learn more about what you can do with MyChart, go to [email or web address].    Your next appointment:   1 year(s)  Provider:   Jove Skains, MD     Other Instructions   

## 2023-02-15 ENCOUNTER — Encounter: Payer: Self-pay | Admitting: Geriatric Medicine

## 2023-02-23 ENCOUNTER — Telehealth: Payer: Self-pay | Admitting: *Deleted

## 2023-02-23 NOTE — Telephone Encounter (Signed)
-----   Message from Sharlene Dory sent at 02/17/2023  9:14 PM EST ----- Thank you for letting me know and talking to him. ----- Message ----- From: Reesa Chew, CMA Sent: 02/16/2023   7:08 PM EST To: Sharlene Dory, PA-C  Per Claiborne Rigg: Patient notified of sleep results and MD recommendations. OSA disease and effects it can have on health  if left untreated was discussed with the patient. He is declining treatment at this time. He will "think about it" and call back if he decides to proceed. Both ordering provider and Dr Tresa Endo  will be notified of patient's decision.  He may have to start over from the beginning because it has been over a year since his sleep study. ----- Message ----- From: Sharlene Dory, PA-C Sent: 02/09/2023   1:16 PM EST To: Reesa Chew, CMA  Hey, can you check into this patient.  He has been waiting on CPAP for a year.  I think he said that the order was never placed somehow.  He tells me his sleep study was done at home September of last year.  Thanks.

## 2023-02-23 NOTE — Telephone Encounter (Signed)
Patient understands he will need an appointment with Dr Tresa Endo to get restarted.

## 2023-03-16 ENCOUNTER — Encounter: Payer: Self-pay | Admitting: Cardiovascular Disease

## 2023-03-16 ENCOUNTER — Ambulatory Visit: Payer: Medicare Other | Attending: Cardiovascular Disease | Admitting: Cardiovascular Disease

## 2023-03-16 VITALS — BP 132/86 | HR 58 | Ht 70.0 in | Wt 220.0 lb

## 2023-03-16 DIAGNOSIS — G4733 Obstructive sleep apnea (adult) (pediatric): Secondary | ICD-10-CM

## 2023-03-16 DIAGNOSIS — R55 Syncope and collapse: Secondary | ICD-10-CM | POA: Diagnosis present

## 2023-03-16 DIAGNOSIS — R0683 Snoring: Secondary | ICD-10-CM | POA: Diagnosis present

## 2023-03-16 DIAGNOSIS — I10 Essential (primary) hypertension: Secondary | ICD-10-CM | POA: Diagnosis present

## 2023-03-16 DIAGNOSIS — E785 Hyperlipidemia, unspecified: Secondary | ICD-10-CM | POA: Diagnosis present

## 2023-03-16 NOTE — Progress Notes (Signed)
Cardiology Office Note    Date:  03/16/2023   ID:  Jordan Evans, DOB 04-11-1950, MRN [id number]  PCP:  Theodis Shove, DO  Cardiologist:  Nicki Guadalajara, MD (sleep); Dr. Anne Fu  New sleep consultation initially referred for sleep evaluation by Dr. Verdis Prime who has retired when who subsequently was seen by Dr. Anne Fu and most recently Jari Favre, PA-C.  History of Present Illness:  Jordan Evans is a 72 y.o. male, patient of Dr. Katrinka Blazing who has a history of hypertension and hyperlipidemia.  He also has been evaluated for syncope and dizziness and most recently was evaluated by Jari Favre, PA-C.  Due to concerns for obstructive sleep apnea, the patient was referred by Dr. Verdis Prime to undergo a home sleep study on December 24, 2021.  At that time, his symptoms included snoring, frequent awakenings, and daytime sleepiness.  He was found to have severe obstructive sleep apnea with an overall AHI at 49.4/h.  O2 nadir decreased to 81% during sleep.  Snoring was also noted.  His heart rate during sleep ranged from 46 to 104 bpm.  Following his sleep study it was recommended with the severity of his symptoms that he undergo an in-lab CPAP with possible BiPAP titration.  If he was unable to obtain an in-lab titration AutoPap was recommended.  Currently after the patient had a sleep study, he decided not to pursue sleep therapy at that time.  However, following his balance issues with loss of consciousness and binocular tunnel vision with presyncope eating to an ER evaluation he later decided to consider using CPAP.  However, during this timeframe, there were significant issues which may have precluded him from initiating therapy.  At that time, our sleep coordinator had left and we were in transition to obtain a new sleep coordinator.  Also the initial DME company was no longer in the CPAP business and it may be very possible that he was never contacted regarding therapy.  In addition,  there was significant issues with semiconductor shortage leading to marked delay in patient's obtaining CPAP machines for up to 8 months.  In any event, it appears that the patient never had his in-lab titration study and never received information concerning initiation of AutoPap therapy.  Following his most recent evaluation with Jari Favre, PA-C on February 09, 2023, he now presents for sleep consultation and evaluation.  Presently, [name] states that he typically goes to bed around midnight and wakes up between 8 and 8:30 am. He experiences nocturia 2-3 times per night.  He retired at age 64.  Had undergone treatment with your stones for his dizziness and balance issues.  He denies significant residual daytime sleepiness.  Epworth Sleepiness Scale score was calculated in the office today and this endorsed at 4.  He presents for his initial evaluation.   Past Medical History:  Diagnosis Date   History of kidney stones    Hyperlipidemia    Hypertension    Hypothyroidism    Syncope    Vertigo    Wears glasses     Past Surgical History:  Procedure Laterality Date   COLONOSCOPY     x2   CYSTOSCOPY WITH LITHOLAPAXY N/A 05/03/2019   Procedure: CYSTOSCOPY WITH LITHOLAPAXY WITH RIGHT PYELOGRAM;  Surgeon: Bjorn Pippin, MD;  Location: [facility];  Service: Urology;  Laterality: N/A;   CYSTOSCOPY WITH URETHRAL DILATATION N/A 05/03/2019   Procedure: CYSTOSCOPY WITH URETHRAL DILATATION;  Surgeon:  Bjorn Pippin, MD;  Location: [facility];  Service: Urology;  Laterality: N/A;   EXTRACORPOREAL SHOCK WAVE LITHOTRIPSY Right 01/24/2017   Procedure: EXTRACORPOREAL SHOCK WAVE LITHOTRIPSY (ESWL);  Surgeon: Hildred Laser, MD;  Location: [facility];  Service: Urology;  Laterality: Right;   HERNIA REPAIR     umbilical hernia- 1998   LITHOTRIPSY     ROTATOR CUFF REPAIR Right    ROTATOR CUFF REPAIR Left    UPPER GI ENDOSCOPY     URETHRAL DILATION     x5    VASECTOMY       Current Medications: Outpatient Medications Prior to Visit  Medication Sig Dispense Refill   alfuzosin (UROXATRAL) 10 MG 24 hr tablet Take 10 mg by mouth daily.     amLODipine (NORVASC) 10 MG tablet TAKE 1 TABLET BY MOUTH EVERY DAY **PLEASE CONTACT OUR OFFICE TO SCHEDULE A FOLLOW UP FOR FUTURE MED REFILLS** 15 tablet 0   ascorbic acid (VITAMIN C) 1000 MG tablet Take 1 tablet by mouth daily.     atorvastatin (LIPITOR) 20 MG tablet TAKE 1 TABLET BY MOUTH EVERY DAY 90 tablet 1   cholecalciferol (VITAMIN D3) 25 MCG (1000 UNIT) tablet Take 1,000 Units by mouth daily.     levothyroxine (SYNTHROID) 88 MCG tablet Take 88 mcg by mouth daily.     Magnesium Oxide -Mg Supplement 250 MG TABS Take 1 tablet by mouth daily.     No facility-administered medications prior to visit.     Allergies:   Percocet [oxycodone-acetaminophen] and Tamsulosin   Social History   Socioeconomic History   Marital status: Married    Spouse name: Not on file   Number of children: Not on file   Years of education: Not on file   Highest education level: Not on file  Occupational History   Not on file  Tobacco Use   Smoking status: Never   Smokeless tobacco: Never  Vaping Use   Vaping status: Never Used  Substance and Sexual Activity   Alcohol use: Yes    Comment: 1 beer weekly   Drug use: Not Currently   Sexual activity: Yes    Birth control/protection: Surgical  Other Topics Concern   Not on file  Social History Narrative   Lives with wife   's[employer] college, works for [employer] resources   Children 5   Caffeine- 1 soda daily   Social Determinants of Corporate investment banker Strain: Not on file  Food Insecurity: Not on file  Transportation Needs: Not on file  Physical Activity: Not on file  Stress: Not on file  Social Connections: Not on file    Socially he was born in [place].  He retired at age 19 and was in the business of [employer].  He is married for 25 years.  His wife is a  Engineer, civil (consulting) for home health.  No tobacco history.  Family History:  The patient's family history includes Cancer in his brother and mother; Diabetes in his paternal grandmother; Heart attack in his father; Hyperlipidemia in his brother, brother, brother, brother, father, mother, and sister; Hypertension in his brother, brother, brother, brother, father, mother, and sister.   ROS General: Negative; No fevers, chills, or night sweats;  HEENT: Negative; No changes in vision or hearing, sinus congestion, difficulty swallowing Pulmonary: Negative; No cough, wheezing, shortness of breath, hemoptysis Cardiovascular: Negative; No chest pain, presyncope, syncope, palpitations GI: Negative; No nausea, vomiting, diarrhea, or abdominal pain GU: Negative; No dysuria,  hematuria, or difficulty voiding Musculoskeletal: Negative; no myalgias, joint pain, or weakness Hematologic/Oncology: Negative; no easy bruising, bleeding Endocrine: Negative; no heat/cold intolerance; no diabetes Neuro: He is balance, but ocular tunnel vision presyncope/syncope Skin: Negative; No rashes or skin lesions Psychiatric: Negative; No behavioral problems, depression Sleep: See HPI: Positive for snoring, previous frequent awakenings, severe OSA. No bruxism, restless legs, hypnogognic hallucinations, no cataplexy Other comprehensive 14 point system review is negative.   PHYSICAL EXAM:   VS:  BP 132/86   Pulse (!) 58   Ht 5\' 10"  (1.778 m)   Wt 220 lb (99.8 kg)   SpO2 94%   BMI 31.57 kg/m     Repeat blood pressure by me was 128/82.  Wt Readings from Last 3 Encounters:  03/16/23 220 lb (99.8 kg)  02/09/23 214 lb (97.1 kg)  12/05/22 210 lb (95.3 kg)    General: Alert, oriented, no distress.  Skin: normal turgor, no rashes, warm and dry HEENT: Normocephalic, atraumatic. Pupils equal round and reactive to light; sclera anicteric; extraocular muscles intact; Nose without nasal septal hypertrophy Mouth/Parynx benign; Mallinpatti  scale 3 Neck: No JVD, no carotid bruits; normal carotid upstroke Lungs: clear to ausculatation and percussion; no wheezing or rales Chest wall: without tenderness to palpitation Heart: PMI not displaced, RRR, s1 s2 normal, 1/6 systolic murmur, no diastolic murmur, no rubs, gallops, thrills, or heaves Abdomen: soft, nontender; no hepatosplenomehaly, BS+; abdominal aorta nontender and not dilated by palpation. Back: no CVA tenderness Pulses 2+ Musculoskeletal: full range of motion, normal strength, no joint deformities Extremities: no clubbing cyanosis or edema, Homan's sign negative  Neurologic: grossly nonfocal; Cranial nerves grossly wnl Psychologic: Normal mood and affect   Studies/Labs Reviewed:   EKG Interpretation Date/Time:  Wednesday March 16 2023 13:46:50 EST Ventricular Rate:  58 PR Interval:  184 QRS Duration:  96 QT Interval:  384 QTC Calculation: 376 R Axis:   -3  Text Interpretation: Sinus bradycardia When compared with ECG of 05-Dec-2022 21:10, Questionable change in QRS axis Confirmed by Nicki Guadalajara ([id number]) on 03/16/2023 4:52:29 PM     Recent Labs:    Latest Ref Rng & Units 12/05/2022    9:17 PM 07/20/2022    4:00 PM 11/11/2020   10:01 AM  BMP  Glucose 70 - 99 mg/dL 578  469  95   BUN 8 - 23 mg/dL 14  17  13    Creatinine 0.61 - 1.24 mg/dL 6.29  5.28  4.13   BUN/Creat Ratio 10 - 24   12   Sodium 135 - 145 mmol/L 137  140  139   Potassium 3.5 - 5.1 mmol/L 3.6  3.6  4.4   Chloride 98 - 111 mmol/L 103  107  103   CO2 22 - 32 mmol/L 22  22  22    Calcium 8.9 - 10.3 mg/dL 8.4  8.9  9.6         Latest Ref Rng & Units 11/11/2020   10:01 AM 07/07/2020    8:37 AM 11/06/2019   10:15 AM  Hepatic Function  Total Protein 6.0 - 8.5 g/dL 7.2  7.3  7.3   Albumin 3.8 - 4.8 g/dL 4.7  4.3  4.7   AST 0 - 40 IU/L 19  21  24    ALT 0 - 44 IU/L 18  20  24    Alk Phosphatase 44 - 121 IU/L 112  109  126   Total Bilirubin 0.0 - 1.2 mg/dL 0.8  0.8  0.6  Latest Ref Rng &  Units 12/05/2022    9:17 PM 07/20/2022    4:00 PM 11/11/2020   10:01 AM  CBC  WBC 4.0 - 10.5 K/uL 6.3  6.5  5.9   Hemoglobin 13.0 - 17.0 g/dL 52.8  41.3  24.4   Hematocrit 39.0 - 52.0 % 42.4  47.7  47.8   Platelets 150 - 400 K/uL 222  242  241    Lab Results  Component Value Date   MCV 87.6 12/05/2022   MCV 87.7 07/20/2022   MCV 86 11/11/2020   Lab Results  Component Value Date   TSH 3.010 11/11/2020   Lab Results  Component Value Date   HGBA1C 5.3 04/11/2019     BNP No results found for: "BNP"  ProBNP No results found for: "PROBNP"   Lipid Panel     Component Value Date/Time   CHOL 172 11/11/2020 1001   CHOL 183 04/18/2015 0000   TRIG 91 11/11/2020 1001   TRIG 107 04/18/2015 0000   HDL 58 11/11/2020 1001   CHOLHDL 3.0 11/11/2020 1001   LDLCALC 97 11/11/2020 1001   LABVLDL 17 11/11/2020 1001     RADIOLOGY: No results found.   Additional studies/ records that were reviewed today include:      Patient Name: Jamesmichael, Goodrum Date: 12/24/2021 Gender: Male D.O.B: [birth year] Age (years): 70 Referring Provider: Verdis Prime Height (inches): 70 Interpreting Physician: Nicki Guadalajara MD, ABSM Weight (lbs): 210 RPSGT: Elaina Pattee BMI: 30 MRN: [id number] Neck Size: 17.00   CLINICAL INFORMATION Sleep Study Type: HST   Indication for sleep study: snoring, frequent awakening, daytime sleepiness   Epworth Sleepiness Score: 5   SLEEP STUDY TECHNIQUE A multi-channel overnight portable sleep study was performed. The channels recorded were: nasal airflow, thoracic respiratory movement, and oxygen saturation with a pulse oximetry. Snoring was also monitored.   MEDICATIONS alfuzosin (UROXATRAL) 10 MG 24 hr tablet amLODipine (NORVASC) 10 MG tablet atorvastatin (LIPITOR) 20 MG tablet cholecalciferol (VITAMIN D3) 25 MCG (1000 UNIT) tablet furosemide (LASIX) 20 MG tablet levothyroxine (SYNTHROID) 88 MCG tablet Magnesium 250 MG TABS  Patient self administered  medications include: N/A.   SLEEP ARCHITECTURE Patient was studied for 364.5 minutes. The sleep efficiency was 100.0 % and the patient was supine for 0%. The arousal index was 0.0 per hour.   RESPIRATORY PARAMETERS The overall AHI was 49.4 per hour, with a central apnea index of 0 per hour.   The oxygen nadir was 81% during sleep. Time spent < 89% was 22.3 minutes.   CARDIAC DATA Mean heart rate during sleep was 60.3 bpm. Heart rate range 46 - 104 bpm.   IMPRESSIONS - Severe obstructive sleep apnea occurred during this study (AHI  49.4/h). - Moderate oxygen desaturation to a nadir of 81). - Patient snored 43.7 minutes (12%) during the sleep.   DIAGNOSIS - Obstructive Sleep Apnea (G47.33) - Nocturnal Hypoxemia (G47.36)   RECOMMENDATIONS - Therapeutic CPAP for treatment of the patient's severe sleep disordered breathing. If unable to obtain an in-lab titration, initiate Auto-PAP with EPR of 3 at 7 - 18 cm of water.  - Effort should be made to optimize nasal and oropharyngeal patency.  - Avoid alcohol, sedatives and other CNS depressants that may worsen sleep apnea and disrupt normal sleep architecture. - Sleep hygiene should be reviewed to assess factors that may improve sleep quality. - Weight management (BMI 30) and regular exercise should be initiated or continued. - Recommend a download and sleep clinic evaluation  after 4 weeks of therapy.     ASSESSMENT:    1. OSA (obstructive sleep apnea)   2. Essential hypertension   3. Hyperlipidemia LDL goal <70   4. Snoring   5. Syncope and collapse     PLAN:  *Redrick Nordahl is a 72 year old gentleman who remotely was followed by Dr. Verdis Prime and now sees Dr. Donato Schultz.  The patient has a history of hypertension, currently on amlodipine 10 mg, hyperlipidemia on atorvastatin 20 mg, and hypothyroidism on levothyroxine 88 mcg.  Due to concerns for obstructive sleep apnea he was referred for a home study which was done on December 04, 2021.  His home study demonstrated severe sleep apnea with an overall AHI at 49.4/h.  He had significant oxygen desaturation to a nadir of 81% and did snore during the procedure.  Initially, it was recommended that he ideally have an in-lab CPAP titration study due to the severity of his symptoms and if unable to be scheduled an attempt at AutoPap would be instituted.  Apparently initially the patient never pursued follow-up until he started to have issues this past year with balance, binocular total vision, and presyncope/syncope.  Unfortunately, there were several issues that may have come into play leading to the patient not having any follow-up until his appointment today.  I discussed these issues with him at length.  During this evaluation, I had an extensive discussion with him regarding sleep apnea and its potential adverse consequences regarding normal sleep architecture and his cardiovascular health.  I discussed the normal parasympathetic response of non-RAM sleep and if frequent arousals are present leading to sympathetic stimulation the benefit of reduction in blood pressure and heart rate during sleep is affected.  I discussed untreated sleep apnea's effect on blood pressure with potential resistant hypertension, increased incidence of nocturnal arrhythmias, as well as increased risk for atrial fibrillation.  In addition I discussed its potential negative consequences regarding insulin resistance leading to increased glucose, increased inflammatory markers, as well as significant increased potential for nocturnal GERD.  In addition, I discussed potential nocturnal hypoxemia contributing to nocturnal ischemia both in the cardiac as well as cerebrovascular territory.  I also discussed the pathophysiology in detail concerning increased nocturia associated with untreated sleep apnea.  After my lengthy discussion, he now has a much better understanding of the importance of therapy particularly with  reference to his cardiovascular health.  Presently, his initial home study was over 1 year ago.  For this reason I do not believe we can automatically initiate AutoPap therapy due to the greater than 1 year duration of his study.  However, my initial recommendation due to the severity of his sleep apnea was that he undergo an in-lab titration study.  We will see if we can schedule him for a titration study alone but he may require an initial 2-hour split-night evaluation for redocumentation of his sleep apnea prior to initiating CPAP or BiPAP titration.  Will see if this titration study can be expedited.  Once he receives a new machine, I will need to see him within 90 days of CPAP initiation.  He was very appreciative of the long time I spent with him today discussing sleep apnea and its effects on his cardiovascular health.   Medication Adjustments/Labs and Tests Ordered: Current medicines are reviewed at length with the patient today.  Concerns regarding medicines are outlined above.  Medication changes, Labs and Tests ordered today are listed in the Patient Instructions below. Patient Instructions  Medication Instructions:  The current medical regimen is effective;  continue present plan and medications as directed. Please refer to the Current Medication list given to you today.     *If you need a refill on your cardiac medications before your next appointment, please call your pharmacy*   Lab Work: No labs were ordered during today's visit.    If you have labs (blood work) drawn today and your tests are completely normal, you will receive your results only by: MyChart Message (if you have MyChart) OR A paper copy in the mail If you have any lab test that is abnormal or we need to change your treatment, we will call you to review the results.   Testing/Procedures: Your physician has recommended that you have a sleep study. This test records several body functions during sleep, including:  brain activity, eye movement, oxygen and carbon dioxide blood levels, heart rate and rhythm, breathing rate and rhythm, the flow of air through your mouth and nose, snoring, body muscle movements, and chest and belly movement.    Follow-Up: At [facility], you and your health needs are our priority.  As part of our continuing mission to provide you with exceptional heart care, we have created designated Provider Care Teams.  These Care Teams include your primary Cardiologist (physician) and Advanced Practice Providers (APPs -  Physician Assistants and Nurse Practitioners) who all work together to provide you with the care you need, when you need it.  We recommend signing up for the patient portal called "MyChart".  Sign up information is provided on this After Visit Summary.  MyChart is used to connect with patients for Virtual Visits (Telemedicine).  Patients are able to view lab/test results, encounter notes, upcoming appointments, etc.  Non-urgent messages can be sent to your provider as well.   To learn more about what you can do with MyChart, go to [email or web address].    Your next appointment:   4 month(s)  Provider:   Nicki Guadalajara, MD     Other Instructions If you have any questions or concerns regarding your c-pap, bi-pap or sleep accessories, please contact Brandie Rorie at [phone].       Signed, Nicki Guadalajara, MD, [facility], ABSM Diplomate, American Board of Sleep Medicine  03/16/2023 5:02 PM    [facility] [address], [address], [place], [place]  [address] Phone: [phone]

## 2023-03-16 NOTE — Patient Instructions (Signed)
Medication Instructions:  The current medical regimen is effective;  continue present plan and medications as directed. Please refer to the Current Medication list given to you today.     *If you need a refill on your cardiac medications before your next appointment, please call your pharmacy*   Lab Work: No labs were ordered during today's visit.    If you have labs (blood work) drawn today and your tests are completely normal, you will receive your results only by: MyChart Message (if you have MyChart) OR A paper copy in the mail If you have any lab test that is abnormal or we need to change your treatment, we will call you to review the results.   Testing/Procedures: Your physician has recommended that you have a sleep study. This test records several body functions during sleep, including: brain activity, eye movement, oxygen and carbon dioxide blood levels, heart rate and rhythm, breathing rate and rhythm, the flow of air through your mouth and nose, snoring, body muscle movements, and chest and belly movement.    Follow-Up: At [facility], you and your health needs are our priority.  As part of our continuing mission to provide you with exceptional heart care, we have created designated Provider Care Teams.  These Care Teams include your primary Cardiologist (physician) and Advanced Practice Providers (APPs -  Physician Assistants and Nurse Practitioners) who all work together to provide you with the care you need, when you need it.  We recommend signing up for the patient portal called "MyChart".  Sign up information is provided on this After Visit Summary.  MyChart is used to connect with patients for Virtual Visits (Telemedicine).  Patients are able to view lab/test results, encounter notes, upcoming appointments, etc.  Non-urgent messages can be sent to your provider as well.   To learn more about what you can do with MyChart, go to [email or web address].    Your next  appointment:   4 month(s)  Provider:   Nicki Guadalajara, MD     Other Instructions If you have any questions or concerns regarding your c-pap, bi-pap or sleep accessories, please contact Brandie Rorie at [phone].

## 2023-05-23 ENCOUNTER — Telehealth: Payer: Self-pay | Admitting: Cardiology

## 2023-05-23 NOTE — Telephone Encounter (Signed)
Jordan Evans from [facility] requesting cb regarding split sleep study

## 2023-05-23 NOTE — Telephone Encounter (Signed)
Routed to sleep studies team

## 2023-05-23 NOTE — Telephone Encounter (Signed)
No PA Required for Split-Night Testing, order sent to [facility] for scheduling.

## 2023-06-13 ENCOUNTER — Emergency Department (HOSPITAL_COMMUNITY)
Admission: EM | Admit: 2023-06-13 | Discharge: 2023-06-14 | Disposition: A | Discharge status: Home / Self Care | Attending: Emergency Medicine | Admitting: Emergency Medicine

## 2023-06-13 ENCOUNTER — Emergency Department (HOSPITAL_COMMUNITY)

## 2023-06-13 DIAGNOSIS — W01198A Fall on same level from slipping, tripping and stumbling with subsequent striking against other object, initial encounter: Secondary | ICD-10-CM | POA: Insufficient documentation

## 2023-06-13 DIAGNOSIS — S0990XA Unspecified injury of head, initial encounter: Secondary | ICD-10-CM | POA: Insufficient documentation

## 2023-06-13 DIAGNOSIS — S01411A Laceration without foreign body of right cheek and temporomandibular area, initial encounter: Secondary | ICD-10-CM | POA: Insufficient documentation

## 2023-06-13 DIAGNOSIS — Y92093 Driveway of other non-institutional residence as the place of occurrence of the external cause: Secondary | ICD-10-CM | POA: Diagnosis not present

## 2023-06-13 DIAGNOSIS — M25511 Pain in right shoulder: Secondary | ICD-10-CM | POA: Diagnosis not present

## 2023-06-13 DIAGNOSIS — S0083XA Contusion of other part of head, initial encounter: Secondary | ICD-10-CM

## 2023-06-13 DIAGNOSIS — W19XXXA Unspecified fall, initial encounter: Secondary | ICD-10-CM

## 2023-06-13 DIAGNOSIS — Y9301 Activity, walking, marching and hiking: Secondary | ICD-10-CM | POA: Insufficient documentation

## 2023-06-13 MED ORDER — ACETAMINOPHEN 325 MG PO TABS
975.0000 mg | ORAL_TABLET | Freq: Once | ORAL | Status: AC
  Administered 2023-06-13: 975 mg via ORAL
  Filled 2023-06-13: qty 3

## 2023-06-13 NOTE — ED Provider Triage Note (Signed)
 Emergency Medicine Provider Triage Evaluation Note  SOTERO BRINKMEYER , a 73 y.o. male  was evaluated in triage.  Pt complains of hitting his head.  Pt complains of right sided scratches and headache  Review of Systems  Positive:  Negative: No loss of consciousness  Physical Exam  BP (!) 153/79 (BP Location: Right Arm)   Pulse 79   Temp 98 F (36.7 C) (Oral)   Resp 16   SpO2 97%  Gen:   Awake, no distress   Resp:  Normal effort  MSK:   Moves extremities without difficulty  Other:  Abrasions right forehead,  facail bones nontender   Medical Decision Making  Medically screening exam initiated at 5:24 PM.  Appropriate orders placed.  Damita Dunnings was informed that the remainder of the evaluation will be completed by another provider, this initial triage assessment does not replace that evaluation, and the importance of remaining in the ED until their evaluation is complete.     Elson Areas, [place] 06/13/23 1725

## 2023-06-13 NOTE — ED Notes (Signed)
 Pt's wife asked to have something done for pt's head pain, RN made aware.

## 2023-06-13 NOTE — ED Notes (Signed)
 Pt's wife came up to front desk and stated pt's head pain was getting worse. Triage RN made aware by this NA.

## 2023-06-13 NOTE — ED Triage Notes (Signed)
 Pt reports that he was walking in his driveway when he tripped, and struck his face on the concrete. Pt denies LOC. Small laceration to R cheek bone. Pt also c/o R shoulder pain. Denies blood thinner use. Wife reports that pt has had frequent falls over the last two years and trouble with a shuffling gait.

## 2023-06-13 NOTE — ED Notes (Signed)
 Pt in waiting room, requesting tylenol. Lauren, PA notified

## 2023-06-14 NOTE — ED Provider Notes (Signed)
 Emergency Department Provider Note   I have reviewed the triage vital signs and the nursing notes.   HISTORY  Chief Complaint Fall   HPI Jordan Evans is a 73 y.o. male with past history of hyperlipidemia, hypertension, baseline shuffling gait presents to the emergency department after mechanical fall at home.  Patient tripped and fell landing face first into his garden.  No loss of consciousness.  No syncope.  He has pain in the right shoulder as well as pain in the right face and jaw.  No vision disturbance.  He had a mild headache earlier but that improved with Tylenol.   Past Medical History:  Diagnosis Date   History of kidney stones    Hyperlipidemia    Hypertension    Hypothyroidism    Syncope    Vertigo    Wears glasses     Review of Systems  Constitutional: No fever/chills Cardiovascular: Denies chest pain. Respiratory: Denies shortness of breath. Gastrointestinal: No abdominal pain.  Musculoskeletal: Positive right shoulder pain.  Skin: Negative for rash. Neurological: Positive HA.    ____________________________________________   PHYSICAL EXAM:  VITAL SIGNS: ED Triage Vitals [06/13/23 1701]  Encounter Vitals Group     BP (!) 153/79     Pulse Rate 79     Resp 16     Temp 98 F (36.7 C)     Temp Source Oral     SpO2 97 %   Constitutional: Alert and oriented. Well appearing and in no acute distress. Eyes: Conjunctivae are normal.  Head: Atraumatic. Nose: No congestion/rhinnorhea. Mouth/Throat: Mucous membranes are moist.   Neck: No stridor. No cervical spine tenderness to palpation. Cardiovascular: Normal rate, regular rhythm. Good peripheral circulation. Grossly normal heart sounds.   Respiratory: Normal respiratory effort.  No retractions. Lungs CTAB. Gastrointestinal: Soft and nontender. No distention.  Musculoskeletal: No lower extremity tenderness nor edema. No gross deformities of extremities. Normal ROM of the bilateral hips. Normal  ROM of the right shoulder. No wrist tenderness or elbow tenderness on the right.  Neurologic:  Normal speech and language. No gross focal neurologic deficits are appreciated.  Skin:  Skin is warm, dry. Contusion to the right cheek. Linear abrasion over the zygomatic arch.   ____________________________________________  RADIOLOGY  CT Head Wo Contrast Result Date: 06/13/2023 CLINICAL DATA:  Head trauma EXAM: CT HEAD WITHOUT CONTRAST TECHNIQUE: Contiguous axial images were obtained from the base of the skull through the vertex without intravenous contrast. RADIATION DOSE REDUCTION: This exam was performed according to the departmental dose-optimization program which includes automated exposure control, adjustment of the mA and/or kV according to patient size and/or use of iterative reconstruction technique. COMPARISON:  None Available. FINDINGS: Brain: Probable calcified meningioma, right parafalcine, unchanged. Brain parenchyma is normal. No extra-axial collection or hemorrhage. Vascular: No hyperdense vessel or unexpected vascular calcification. Skull: The visualized skull base, calvarium and extracranial soft tissues are normal. Sinuses/Orbits: No fluid levels or advanced mucosal thickening of the visualized paranasal sinuses. No mastoid or middle ear effusion. Normal orbits. Other: None. IMPRESSION: 1. No acute intracranial abnormality. Electronically Signed   By: Deatra Robinson M.D.   On: 06/13/2023 20:07   DG Shoulder Right Result Date: 06/13/2023 CLINICAL DATA:  Fall EXAM: RIGHT SHOULDER - 2+ VIEW COMPARISON:  None Available. FINDINGS: No fracture or malalignment. Mild AC joint and glenohumeral degenerative changes IMPRESSION: No acute osseous abnormality. Electronically Signed   By: Jasmine Pang M.D.   On: 06/13/2023 20:03    ____________________________________________  PROCEDURES  Procedure(s) performed:   Procedures  None  ____________________________________________   INITIAL  IMPRESSION / ASSESSMENT AND PLAN / ED COURSE  Pertinent labs & imaging results that were available during my care of the patient were reviewed by me and considered in my medical decision making (see chart for details).   This patient is Presenting for Evaluation of head injury, which does require a range of treatment options, and is a complaint that involves a high risk of morbidity and mortality.  The Differential Diagnoses includes subdural hematoma, epidural hematoma, acute concussion, traumatic subarachnoid hemorrhage, cerebral contusions, etc.   Critical Interventions-    Medications  acetaminophen (TYLENOL) tablet 975 mg (975 mg Oral Given 06/13/23 2247)    Reassessment after intervention: HA improved.    I did obtain Additional Historical Information from wife at bedside.  Radiologic Tests Ordered, included CT head and shoulder XR. I independently interpreted the images and agree with radiology interpretation.   Cardiac Monitor Tracing which shows NSR.    Social Determinants of Health Risk patient is a non-smoker.   Medical Decision Making: Summary:  Patient presents the emergency department after mechanical fall.  Has an abrasion/contusion to the right cheek.  Considered closure with suture but wound is well-approximated and hemostatic.  Will allow to heal by secondary intention.  No evidence of internal bleeding or fracture.  X-ray of the shoulder is unremarkable.  Patient with intact neuroexam.  Plan for discharge with PCP follow-up plan.  He is up-to-date on tetanus.  Patient's presentation is most consistent with acute, uncomplicated illness.   Disposition: discahrge  ____________________________________________  FINAL CLINICAL IMPRESSION(S) / ED DIAGNOSES  Final diagnoses:  Fall, initial encounter  Injury of head, initial encounter  Contusion of face, initial encounter  Acute pain of right shoulder    Note:  This document was prepared using Dragon voice  recognition software and may include unintentional dictation errors.  Alona Bene, MD, [facility] Emergency Medicine    Laneka Mcgrory, Arlyss Repress, MD 06/14/23 [phone]

## 2023-06-14 NOTE — Discharge Instructions (Signed)
You were seen in the Emergency Department (ED) today for a head injury.  Based on your evaluation, you may have sustained a concussion (or bruise) to your brain.  If you had a CT scan done, it did not show any evidence of serious injury or bleeding.   ° °Symptoms to expect from a concussion include nausea, mild to moderate headache, difficulty concentrating or sleeping, and mild lightheadedness.  These symptoms should improve over the next few days to weeks, but it may take many weeks before you feel back to normal.  Return to the emergency department or follow-up with your primary care doctor if your symptoms are not improving over this time. ° °Signs of a more serious head injury include vomiting, severe headache, excessive sleepiness or confusion, and weakness or numbness in your face, arms or legs.  Return immediately to the Emergency Department if you experience any of these more concerning symptoms.   ° °Rest, avoid strenuous physical or mental activity, and avoid activities that could potentially result in another head injury until all your symptoms from this head injury are completely resolved for at least 2-3 weeks.  You may take acetaminophen over the counter according to label instructions for mild headache or scalp soreness. ° °

## 2023-07-14 ENCOUNTER — Ambulatory Visit (HOSPITAL_BASED_OUTPATIENT_CLINIC_OR_DEPARTMENT_OTHER): Payer: Medicare Other | Attending: Cardiovascular Disease | Admitting: Cardiovascular Disease

## 2023-07-14 VITALS — Ht 70.0 in | Wt 210.0 lb

## 2023-07-14 DIAGNOSIS — G4733 Obstructive sleep apnea (adult) (pediatric): Secondary | ICD-10-CM | POA: Insufficient documentation

## 2023-08-15 ENCOUNTER — Encounter (HOSPITAL_BASED_OUTPATIENT_CLINIC_OR_DEPARTMENT_OTHER): Payer: Self-pay | Admitting: Cardiovascular Disease

## 2023-08-15 ENCOUNTER — Telehealth: Payer: Self-pay | Admitting: Cardiovascular Disease

## 2023-08-15 DIAGNOSIS — R55 Syncope and collapse: Secondary | ICD-10-CM

## 2023-08-15 DIAGNOSIS — R0609 Other forms of dyspnea: Secondary | ICD-10-CM

## 2023-08-15 DIAGNOSIS — R0683 Snoring: Secondary | ICD-10-CM

## 2023-08-15 DIAGNOSIS — G4733 Obstructive sleep apnea (adult) (pediatric): Secondary | ICD-10-CM

## 2023-08-15 DIAGNOSIS — E785 Hyperlipidemia, unspecified: Secondary | ICD-10-CM

## 2023-08-15 DIAGNOSIS — I10 Essential (primary) hypertension: Secondary | ICD-10-CM

## 2023-08-15 DIAGNOSIS — G4719 Other hypersomnia: Secondary | ICD-10-CM

## 2023-08-15 NOTE — Procedures (Unsigned)
 [facility] [address] [place], [place] [address] Tel: [phone]   Fax: [phone]  Split Night Interpretation  Patient Name:  Jordan Evans, Jordan Evans Date:  07/14/2023 Referring Physician:  Dr. Magnus Schuller  Indications for Polysomnography The patient is a 73 year old Male who is 5\' 10"  and weighs 210.0 lbs.  His BMI equals 30.4.  A diagnostic polysomnogram was performed to evaluate for OSA.  After 121.5 minutes of sleep time the patient exhibited sufficient respiratory events qualifying him for a CPAP trial which was then initiated.    No Medications     Polysomnogram Data A full night polysomnogram was performed recording the standard physiologic parameters including EEG, EOG, EMG, EKG, nasal and oral airflow.  Respiratory parameters of chest and abdominal movements are recorded with [place]Crystal motion transducers.  Oxygen saturation was recorded by pulse oximetry.    Sleep Architecture The total recording time of the diagnostic portion of the study was 177.8 minutes.  The total sleep time was 121.5 minutes.  During the diagnostic portion of the study, the patient spent 7.0% of total sleep time in Stage N1, 79.4% in Stage N2, 0.0% in Stages N3, and 13.6% in REM.   Sleep latency was 42.8 minutes.  REM latency was 87.5 minutes.  Sleep Efficiency was 68.3%.  Wake after Sleep Onset time was 13.5 minutes.   At 01:48:50 AM the patient was placed on PAP treatment and was titrated at pressures ranging from 5* cm/H20 up to 11* cm/H20. The total recording time of the treatment portion of the study was 208.7 minutes.  The total sleep time was 151.5 minutes.  During the treatment portion of the study, the patient spent 6.9% of total sleep time in Stage N1, 80.9% in Stage N2, 0.7% in Stages N3, and 11.6% in REM.   Sleep latency was 24.0 minutes.  REM latency was 63.5 minutes.  Sleep Efficiency was 72.6%.  Wake after Sleep Onset time was 32.5  minutes.  Respiratory Events During the diagnostic portion of the study, the polysomnogram revealed a presence of obstructive, - central, and - mixed apneas resulting in an Apnea index of 20 events per hour.  There were 42 hypopneas (>=3% desaturation and/or arousal) resulting in an Apnea\Hypopnea Index (AHI >=3% desaturation and/or arousal) of 20.7 events per hour.  There were 21 hypopneas (>=4% desaturation) resulting in an Apnea\Hypopnea Index (AHI >=4% desaturation) of 10.4 events per hour.  Events were severe  with REM sleep. There were 16 Respiratory Effort Related Arousals resulting in a RERA index of 7.9 events per hour. The Respiratory Disturbance Index is 28.6 events per hour.  The snore index was 226.2 events per hour.  Mean oxygen saturation was 91.1%.  The lowest oxygen saturation during sleep was 81.0%.  Time spent <=88% oxygen saturation was 12.1 minutes (7.0%).    During the treatment portion of the study, the polysomnogram revealed a presence of obstructive, - central, and - mixed apneas resulting in an Apnea index of - events per hour.  There were 18 hypopneas (>=3% desaturation and/or arousal) resulting in an Apnea\Hypopnea Index (AHI >=3% desaturation and/or arousal) of 7.1 events per hour.  There were 5 hypopneas (>=4% desaturation) resulting in an Apnea\Hypopnea Index (AHI >=4% desaturation) of 2.0 events per hour.  There were 10 Respiratory Effort Related Arousals resulting in a RERA index of 4.0 events per hour. The Respiratory Disturbance Index is 11.1 events per hour.  The snore index was 78.4 events per  hour.  Mean oxygen saturation was 91.4%.  The lowest oxygen saturation during sleep was 88.0%.  Time spent <=88% oxygen saturation was 1.6 minutes (0.8%).  Limb Activity During the diagnostic portion of the study, there were 0 limb movements recorded.  Of this total, - were classified as PLMs.  Of the PLMs, - were associated with arousals.  The Limb Movement index was - per hour  while the PLM index was - per hour.  During the treatment portion of the study, there were 0 limb movements recorded.  Of this total, - were classified as PLMs.  Of the PLMs, - were associated with arousals.  The Limb Movement index was - per hour while the PLM index was - per hour.  Cardiac Summary During the diagnostic portion of the study, the average pulse rate was 68.2 bpm.  The minimum pulse rate was 60.0 bpm while the maximum pulse rate was 98.0 bpm.  During the treatment portion of the study, the average pulse rate was 59.9 bpm.  The minimum pulse rate was 51.0 bpm while the maximum pulse rate was 96.0 bpm.   Diagnosis:  Moderate overall sleep apnea (AHI 20.7/h) with events severe during REM sleep (40/h). Snoring  Recommendations: Therapeutic CPAP Auto with EPR of 3 at 11 - 18 cm of water with heated humidication. Effort should be made to optimize nasal and oropharyngeal patency. Avoid alcohol, sedatives and other CNS depressants that may worsen sleep apnea and disrupt snormal sleep architecture. Sleep hygiene should be reviewed.  Weight management and regular exercise    This study was personally reviewed and electronically signed by: Dr. Magnus Schuller Accredited Board Certified in Sleep Medicine Date/Time: Aug 15, 2023 3:23pm   Split Night Report  Patient Name: Jordan Evans, Jordan Evans Study Date: 07/14/2023  Date of Birth: 05-24-1950 Study Type: Split Night  Age: 48 year MRN #: [id number]  Sex: Male Interpreting Physician: Magnus Schuller [id number]  Height: 5\' 10"  Referring Physician: Dr. Magnus Schuller  Weight: 210.0 lbs Recording Tech: Charlsie Cool CRT RPSGT RST  BMI: 30.4 Scoring Tech: Charlsie Cool CRT RPSGT RST  ESS: 8 Neck Size: 15.25  Mask Type FISHER & PAYKEL EVORA FULL FACE MASK Final Pressure: 11cmH2O  Mask Size: LARGE Supplemental O2: N/A   Study Overview  DIAGNOSTIC TREATMENT  Lights Off: 10:50:34 PM Lights Off: 01:48:21 AM  Lights On: 01:48:20 AM Lights On:  05:17:03 AM  Time in Bed: 177.8 min. Time in Bed: 208.7 min.  Total Sleep Time: 121.5 min. Total Sleep Time: 151.5 min.  Sleep Efficiency: 68.3% Sleep Efficiency: 72.6%  Sleep Latency: 42.8 min. Sleep Latency: 24.0 min.  REM Latency from Sleep Onset: 87.5 min. REM Latency from Sleep Onset: 63.5 min.  Wake After Sleep Onset: 13.5 min. Wake After Sleep Onset: 32.5 min.   DIAGNOSTIC TREATMENT   Count Index  Count Index  Awakenings: 8 4.0 Awakenings: 9 3.6  Arousals: 48 23.7 Arousals: 25 9.9  AHI (>=3% Desat and/or Ar.): 42 20.7 AHI (>=3% Desat and/or Ar.): 18 7.1  AHI (>=4% Desat): 21 10.4 AHI (>=4% Desat): 5 2.0   Limb Movements: - - Limb Movements: - -  Snore: 458 226.2 Snore: 198 78.4  Desaturations: 46 22.7 Desaturations: 23 9.1  Minimum SpO2 TST: 81.0% Minimum SpO2 TST: 88.0%    Sleep Architecture   DIAGNOSTIC TREATMENT ENTIRE NIGHT  Stages Time (mins) % Sleep Time Time (mins) % Sleep Time Time (mins) % Sleep Time  Wake 56.5  57.0  113.5   Stage N1  8.5 7.0% 10.5 6.9% 19.0 7.0%  Stage N2 96.5 79.4% 122.5 80.9% 219.0 80.2%  Stage N3 0.0 0.0% 1.0 0.7% 1.0 0.4%  REM 16.5 13.6% 17.5 11.6% 34.0 12.5%   Arousal Summary   DIAGNOSTIC TREATMENT   NREM REM TST Index NREM REM TST Index  Respiratory Ar. 20 3 23  11.4 12 - 12 4.8  PLM Ar. - - - - - - - -  Isolated Limb Movement Ar. - - - - - - - -  Snore Ar. 8 - 8 4.0 1 - 1 0.4  Spontaneous Ar. 17 - 17 8.4 10 2 12  4.8  Total Ar. 45 3 48 23.7 23 2 25  9.9    Respiratory Summary  DIAGNOSTIC By Sleep Stage By Body Position Total   NREM REM Supine Non-Supine   Time (min) 105.0 16.5 44.0 77.5 121.5         Obstructive Apnea - - - - -  Mixed Apnea - - - - -  Central Apnea - - - - -  Total Apneas - - - - -  Total Apnea Index - - - - -         Hypopneas (>=3% Desat and/or Ar.) 31 11 21 21  42  AHI (>=3% Desat and/or Ar.) 17.7 40.0 28.6 16.3 20.7         Hypopneas (>=4% Desat) 11 10 8 13 21   AHI (>=4% Desat) 6.3 36.4 10.9 10.1 10.4           RERAs 14 2 8 8 16   RERA Index 8.0 7.3 10.9 6.2 7.9         RDI 25.7 47.3 39.5 22.5 28.6    TREATMENT By Sleep Stage By Body Position Total   NREM REM Supine Non-Supine   Time (min) 134.0 17.5 52.0 99.5 151.5         Obstructive Apnea - - - - -  Mixed Apnea - - - - -  Central Apnea - - - - -  Total Apneas - - - - -  Total Apnea Index - - - - -         Hypopneas (>=3% Desat and/or Ar.) 15 3 3 15 18   AHI (>=3% Desat and/or Ar.) 6.7 10.3 3.5 9.0 7.1         Hypopneas (>=4% Desat) 4 1 1 4 5   AHI (>=4% Desat) 1.8 3.4 1.2 2.4 2.0          RERAs 10 - 5 5 10   RERA Index 4.5 - 5.8 3.0 4.0         RDI 11.2 10.3 9.2 12.1 11.1    Respiratory Event Durations   DIAGNOSTIC TREATMENT  Apnea NREM REM NREM REM  Average (seconds) - - - -  Maximum (seconds) - - - -  Hypopnea      Average (seconds) 23.0 23.3 24.3 19.5  Maximum (seconds) 34.2 32.0 30.2 25.2    Limb Movement Summary   DIAGNOSTIC TREATMENT   Count Index Count Index  Isolated Limb Movements - - - -  Periodic Limb Movements (PLMs) - - - -  Total Limb Movements - - - -    Oxygen Saturation Summary   DIAGNOSTIC TREATMENT   Wake NREM REM TST Wake NREM REM TST  Average SpO2 92.4% 91.0% 88.0% 90.6% 92.5% 90.9% 91.4% 91.0%  Minimum SpO2 83.0% 87.0% 81.0% 81.0%  86.0% 88.0% 88.0% 88.0%   Maximum SpO2 96.0% 93.0% 94.0% 94.0%  96.0% 95.0% 96.0% 96.0%  DIAGNOSTIC Oxygen Saturation Distribution  Range (%) Time in range (min) Time in range (%)   90.0 - 100.0 131.2 75.9%  80.0 - 90.0 41.6 24.1%  70.0 - 80.0 - -  60.0 - 70.0 - -  50.0 - 60.0 - -  0.0 - 50.0 - -  Time Spent <=88% SpO2  Range (%) Time in range (min) Time in range (%)  0.0 - 88.0 12.1 7.0%      Count Index  Desaturations: 46 22.7   TREATMENT Oxygen Saturation Distribution  Range (%) Time in range (min) Time in range (%)   90.0 - 100.0 156.4 75.1%  80.0 - 90.0 51.9 24.9%  70.0 - 80.0 - -  60.0 - 70.0 - -  50.0 - 60.0 - -  0.0 - 50.0 -  -  Time Spent <=88% SpO2  Range (%) Time in range (min) Time in range (%)  0.0 - 88.0 1.6 0.8%      Count Index  Desaturations: 23 9.1     Cardiac Summary   DIAGNOSTIC TREATMENT   Wake NREM REM Total Wake NREM REM Total  Average Pulse Rate (BPM) 68.5 67.8 70.3 68.2 61.2 59.2 60.8 59.9  Minimum Pulse Rate (BPM) 60.0 60.0 60.0 60.0 52.0 51.0 53.0 51.0  Maximum Pulse Rate (BPM) 98.0 83.0 84.0 98.0 96.0 75.0 72.0 96.0   Pulse Rate Distribution   DIAGNOSTIC  Range (bpm) Time in range (min) Time in range (%)  0.0 - 40.0 - -  40.0 - 60.0 0.3 0.2%  60.0 - 80.0 169.4 98.1%  80.0 - 100.0 2.9 1.7%  100.0 - 120.0 - -  120.0 - 140.0 - -  140.0 - 200.0 - -   TREATMENT  Range (bpm) Time in range (min) Time in range (%)  0.0 - 40.0 - -  40.0 - 60.0 123.9 59.4%  60.0 - 80.0 80.6 38.6%  80.0 - 100.0 0.5 0.2%  100.0 - 120.0 - -  120.0 - 140.0 - -  140.0 - 200.0 - -   EtCO2 Summary - Diagnostic  Stage Min (mmHg) Average (mmHg) Max (mmHg)  Wake - - -  NREM(1+2+3) - - -  REM - - -   EtCO2 Distribution:  Range (mmHg) Time in range (min) Time in range (%)  20.0 - 40.0 - -  40.0 - 50.0 - -  50.0 - 100.0 - -  55.0 - 100.0 - -  Excluded data <20.0 & >65.0 178.0 100.0%   Titration Summary  PAP Device PAP Level O2 Level Time (min) Wake (min) NREM (min) REM (min) Sleep Eff% OA# CA# MA# Hyp# (>=3%) AHI (>=3%) Hyp# (>=4%) AHI (>=%4) RERA RDI OSat <=88% (min) Min [employer] Ar. Index  - Off - 178.0 56.5 105.0 16.5 68.3% - - - 42 20.7 21  10.4 16  28.6  11.1 81.0 90.6 23.7  CPAP 5 - 41.0 24.5 16.5 0.0 40.2% - - - 5 18.2 2  7.3 1  21.8  0.0 89.0 91.2 18.2  CPAP 7 - 10.5 0.0 10.5 0.0 100.0% - - - 1 5.7 -  - -  5.7  0.0 89.0 90.7 -  CPAP 8 - 30.0 0.5 29.5 0.0 98.3% - - - 1 2.0 -  - 2  6.1  0.0 89.0 90.4 6.1  CPAP 9 - 31.5 6.0 20.5 5.0 81.0% - - - 7 16.5 2  4.7 2  21.2  1.3 88.0 90.6 14.1  CPAP 10 - 79.5 24.0 55.0  0.0 69.2% - - - 2 2.2 -  - 5  7.6  0.0 89.0 91.1 10.9  CPAP 11 -  16.5 2.0 2.0 12.5 87.9% - - - 2 8.3 1  4.1 -  8.3  0.0 90.0 92.2 4.1    Hypnograms                           Technologist Comments  THE 46-YEAR-OLD MALE PATIENT PRESENTED TO THE SLEEP DISORDER CENTER FOR A SPLIT NIGHT STUDY WITH A CHIEF COMPLAINT OF OSA. THE PATIENT WAS FITTED WITH A MEDIUM FISHER & PAYKEL ESON NASAL MASK, A MEDIUM FISHER & PAYKEL SIMPLUS FFM AND A LARGE FISHER & PAYKEL EVORA FULL FACE MASK FOR PRE-CPAP TRIAL. THE PATIENT CHOSE AND TOLERATED THE MEDIUM FISHER & PAYKEL EVORA FULL FACE MASK IF CPAP THERAPY WAS WARRANTED. NO BEDTIME MEDICATIONS WERE SELF ADMINISTERED. SUPPLEMENTAL OXYGEN WAS NOT WARRANTED DURING THE STUDY. NO PLMs - PLMAs WERE NOTED. ONE RESTROOM VISIT WAS MADE. NO OBVIOUS CARDIAC ARRHYTHMIAS OR PARASOMNIAs WERE OBSERVED. SPLIT NIGHT CRITERIA WAS MET AT 0140. THE PATIENT WAS PLACED ON CPAP USING THE ABOVE MASK TYPE AND INITIATED ON A CPAP PRESSURE OF 5cmH2O WITH AN EPR OF 2cmH2O AND HEATED HUMIDIFICATION. THE CPAP PRESSURE WAS INCREASED FOR SNORING, RESPIRATORY EVENTS AND RERAs UNTIL OPTIMAL PRESSURE WAS ACHIEVED. THE PATIENT TOLERATED THE CPAP TRIAL WELL.                              Magnus Schuller Diplomate, American Board of Sleep Medicine  ELECTRONICALLY SIGNED ON:  08/15/2023, 3:33 PM [facility] PH: [phone]   FX: [phone] ACCREDITED BY THE AMERICAN ACADEMY OF SLEEP MEDICINE

## 2023-08-15 NOTE — Telephone Encounter (Signed)
 Pt calling in to see if there is any update on him getting sleep study results back.

## 2023-08-17 NOTE — Telephone Encounter (Signed)
 Notified patient of sleep study results and recommendations. Order for Therapeutic CPAP Auto with EPR of 3 at 11 - 18 cm of water with heated humidification has been sent to [facility] 08/16/23

## 2023-09-23 ENCOUNTER — Telehealth: Payer: Self-pay | Admitting: Diagnostic Neuroimaging

## 2023-09-23 NOTE — Telephone Encounter (Signed)
 Pt called to cancel appt, will call back to reschedule

## 2023-09-26 ENCOUNTER — Institutional Professional Consult (permissible substitution): Admitting: Diagnostic Neuroimaging

## 2023-09-26 NOTE — Telephone Encounter (Signed)
 Noted
